# Patient Record
Sex: Female | Born: 1985
Health system: Southern US, Community
[De-identification: ages and names within clinical notes are randomized; demographics above are authoritative.]

## PROBLEM LIST (undated history)

## (undated) DIAGNOSIS — I10 Essential (primary) hypertension: Secondary | ICD-10-CM

## (undated) DIAGNOSIS — E039 Hypothyroidism, unspecified: Secondary | ICD-10-CM

## (undated) DIAGNOSIS — Z8619 Personal history of other infectious and parasitic diseases: Secondary | ICD-10-CM

## (undated) DIAGNOSIS — T8859XA Other complications of anesthesia, initial encounter: Secondary | ICD-10-CM

## (undated) DIAGNOSIS — B009 Herpesviral infection, unspecified: Secondary | ICD-10-CM

## (undated) DIAGNOSIS — K219 Gastro-esophageal reflux disease without esophagitis: Secondary | ICD-10-CM

## (undated) DIAGNOSIS — T4145XA Adverse effect of unspecified anesthetic, initial encounter: Secondary | ICD-10-CM

## (undated) HISTORY — DX: Personal history of other infectious and parasitic diseases: Z86.19

## (undated) HISTORY — PX: NO PAST SURGERIES: SHX2092

---

## 2012-01-15 ENCOUNTER — Encounter: Payer: Self-pay | Admitting: Internal Medicine

## 2012-01-15 ENCOUNTER — Ambulatory Visit (INDEPENDENT_AMBULATORY_CARE_PROVIDER_SITE_OTHER): Payer: PRIVATE HEALTH INSURANCE | Admitting: Internal Medicine

## 2012-01-15 DIAGNOSIS — F419 Anxiety disorder, unspecified: Secondary | ICD-10-CM

## 2012-01-15 DIAGNOSIS — F17200 Nicotine dependence, unspecified, uncomplicated: Secondary | ICD-10-CM

## 2012-01-15 DIAGNOSIS — G4700 Insomnia, unspecified: Secondary | ICD-10-CM

## 2012-01-15 DIAGNOSIS — Z720 Tobacco use: Secondary | ICD-10-CM

## 2012-01-15 DIAGNOSIS — F411 Generalized anxiety disorder: Secondary | ICD-10-CM

## 2012-01-15 MED ORDER — BUPROPION HCL ER (XL) 150 MG PO TB24: 150.0000 mg | ORAL_TABLET | Freq: Every day | ORAL | Status: DC

## 2012-01-15 MED ORDER — ALPRAZOLAM 0.25 MG PO TABS: 0.2500 mg | ORAL_TABLET | Freq: Two times a day (BID) | ORAL | Status: AC | PRN

## 2012-01-15 NOTE — Assessment & Plan Note (Signed)
Will plan to start Wellbutrin 150 mg daily. Will also use Xanax as needed for severe episodes of anxiety. Patient will followup in one month to assess progress. She will call sooner if any concerns or questions.

## 2012-01-15 NOTE — Assessment & Plan Note (Signed)
Secondary to anxiety. As above, we will start Wellbutrin to help with anxiety. She will also use Xanax as needed for both severe anxiety and to help initiate sleep as needed. She will followup in one month.

## 2012-01-15 NOTE — Progress Notes (Signed)
Subjective:    Patient ID: Jacqueline Peterson, female    DOB: Apr 08, 1986, 26 y.o.   MRN: [id number]  HPI 26 year old female presents to establish care. She reports that generally she is feeling good. Her primary concern today is increased anxiety since starting her new job. 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 She notes increased anxiety during her workday when dealing with patients. She then is very fatigued at night been unable to sleep because of anxiety. She denies any depressed mood. She is interested in medications which might help with anxiety.  She also notes a history of smoking. She used to smoke 2 packs per day but has cut this down to half a pack per day. She is interested in mechanisms to help her quit smoking. She has tried nicotine replacement before with no improvement. She notes she is trying to make positive changes in her health is currently training for a 5K race.  Outpatient Encounter Prescriptions as of 01/15/2012  Medication Sig Dispense Refill  . ALPRAZolam (XANAX) 0.25 MG tablet Take 1 tablet (0.25 mg total) by mouth 2 (two) times daily as needed for sleep.  20 tablet  0  . buPROPion (WELLBUTRIN XL) 150 MG 24 hr tablet Take 1 tablet (150 mg total) by mouth daily.  30 tablet  3  . Cyanocobalamin (B-12) 1500 MCG TBCR Take by mouth daily.      . Multiple Vitamins-Minerals (MULTIVITAMIN WITH MINERALS) tablet Take 1 tablet by mouth daily.        Review of Systems  Constitutional: Negative for fever, chills, appetite change, fatigue and unexpected weight change.  HENT: Negative for ear pain, congestion, sore throat, trouble swallowing, neck pain, voice change and sinus pressure.   Eyes: Negative for visual disturbance.  Respiratory: Negative for cough, shortness of breath, wheezing and stridor.   Cardiovascular: Negative for chest pain, palpitations and leg swelling.  Gastrointestinal: Negative for nausea, vomiting, abdominal pain,  diarrhea, constipation, blood in stool, abdominal distention and anal bleeding.  Genitourinary: Negative for dysuria and flank pain.  Musculoskeletal: Negative for myalgias, arthralgias and gait problem.  Skin: Negative for color change and rash.  Neurological: Negative for dizziness and headaches.  Hematological: Negative for adenopathy. Does not bruise/bleed easily.  Psychiatric/Behavioral: Negative for suicidal ideas, sleep disturbance and dysphoric mood. The patient is nervous/anxious.    BP 116/60  Pulse 98  Temp(Src) 98 F (36.7 C) (Oral)  Ht 5' 3.5" (1.613 m)  Wt 215 lb (97.523 kg)  BMI 37.49 kg/m2  SpO2 100%  LMP 01/11/2012     Objective:   Physical Exam  Constitutional: She is oriented to person, place, and time. She appears well-developed and well-nourished. No distress.  HENT:  Head: Normocephalic and atraumatic.  Right Ear: External ear normal.  Left Ear: External ear normal.  Nose: Nose normal.  Mouth/Throat: Oropharynx is clear and moist. No oropharyngeal exudate.  Eyes: Conjunctivae are normal. Pupils are equal, round, and reactive to light. Right eye exhibits no discharge. Left eye exhibits no discharge. No scleral icterus.  Neck: Normal range of motion. Neck supple. No tracheal deviation present. No thyromegaly present.  Cardiovascular: Normal rate, regular rhythm, normal heart sounds and intact distal pulses.  Exam reveals no gallop and no friction rub.   No murmur heard. Pulmonary/Chest: Effort normal and breath sounds normal. No respiratory distress. She has no wheezes. She has no rales. She exhibits no tenderness.  Abdominal: Soft. Bowel sounds are normal. She  exhibits no distension and no mass. There is no tenderness. There is no rebound and no guarding.  Musculoskeletal: Normal range of motion. She exhibits no edema and no tenderness.  Lymphadenopathy:    She has no cervical adenopathy.  Neurological: She is alert and oriented to person, place, and time. No  cranial nerve deficit. She exhibits normal muscle tone. Coordination normal.  Skin: Skin is warm and dry. No rash noted. She is not diaphoretic. No erythema. No pallor.  Psychiatric: She has a normal mood and affect. Her behavior is normal. Judgment and thought content normal.          Assessment & Plan:

## 2012-01-15 NOTE — Assessment & Plan Note (Signed)
Encouraged smoking cessation. She will start Wellbutrin and then set a quit date approximately 2 weeks after starting this medication. She will followup in one month.

## 2012-03-24 ENCOUNTER — Encounter: Payer: Self-pay | Admitting: Internal Medicine

## 2012-03-24 ENCOUNTER — Ambulatory Visit (INDEPENDENT_AMBULATORY_CARE_PROVIDER_SITE_OTHER): Payer: PRIVATE HEALTH INSURANCE | Admitting: Internal Medicine

## 2012-03-24 VITALS — BP 110/60 | HR 81 | Temp 98.6°F | Ht 63.5 in | Wt 215.0 lb

## 2012-03-24 DIAGNOSIS — F411 Generalized anxiety disorder: Secondary | ICD-10-CM

## 2012-03-24 DIAGNOSIS — F17200 Nicotine dependence, unspecified, uncomplicated: Secondary | ICD-10-CM

## 2012-03-24 DIAGNOSIS — Z0000 Encounter for general adult medical examination without abnormal findings: Secondary | ICD-10-CM

## 2012-03-24 DIAGNOSIS — Z124 Encounter for screening for malignant neoplasm of cervix: Secondary | ICD-10-CM

## 2012-03-24 DIAGNOSIS — F419 Anxiety disorder, unspecified: Secondary | ICD-10-CM

## 2012-03-24 DIAGNOSIS — E663 Overweight: Secondary | ICD-10-CM | POA: Insufficient documentation

## 2012-03-24 DIAGNOSIS — Z720 Tobacco use: Secondary | ICD-10-CM

## 2012-03-24 MED ORDER — ALPRAZOLAM 0.25 MG PO TABS: 0.2500 mg | ORAL_TABLET | Freq: Every evening | ORAL | Status: DC | PRN

## 2012-03-24 MED ORDER — PHENTERMINE HCL 37.5 MG PO CAPS: 37.5000 mg | ORAL_CAPSULE | ORAL | Status: DC

## 2012-03-24 NOTE — Assessment & Plan Note (Signed)
General exam including breast exam and pelvic exam is normal today. Pap is pending. Encourage continued efforts at healthy diet and exercise. Encourage smoking cessation. Reviewed recent lab work which showed normal cholesterol, kidney function and liver function. Patient will followup in one month.

## 2012-03-24 NOTE — Assessment & Plan Note (Signed)
Encourage smoking cessation. May try Wellbutrin again in the future when she feels ready.

## 2012-03-24 NOTE — Assessment & Plan Note (Signed)
BMI 37. Discussed importance of monitoring caloric intake and exercising. Will start phentermine to help with appetite suppression. Discussed potential side effects. Patient will followup in one month.

## 2012-03-24 NOTE — Assessment & Plan Note (Signed)
Symptoms were improved with Wellbutrin. However, patient has stopped medication and would like to hold off on use for now. May resume in the future if symptoms recur.

## 2012-03-24 NOTE — Progress Notes (Signed)
Subjective:    Patient ID: Jacqueline Peterson, female    DOB: 1986-09-22, 26 y.o.   MRN: [id number]  HPI 26 year old female with history of anxiety presents for annual exam. She reports that she is doing well. She has stopped taking her Wellbutrin, but notes that she did have some improvement in her anxiety level with this medication.  She is concerned today about her weight. She has been making effort to lose weight by following a healthy diet and increasing her physical activity. She is interested in trying appetite suppressant to help with weight loss. She has not used these in the past.  Outpatient Encounter Prescriptions as of 03/24/2012  Medication Sig Dispense Refill  . ALPRAZolam (XANAX) 0.25 MG tablet Take 1 tablet (0.25 mg total) by mouth at bedtime as needed.  30 tablet  5  . buPROPion (WELLBUTRIN XL) 150 MG 24 hr tablet Take 1 tablet (150 mg total) by mouth daily.  30 tablet  3  . Cyanocobalamin (B-12) 1500 MCG TBCR Take by mouth daily.      . Multiple Vitamins-Minerals (MULTIVITAMIN WITH MINERALS) tablet Take 1 tablet by mouth daily.      . vitamin C (ASCORBIC ACID) 500 MG tablet Take 500 mg by mouth daily.      Marland Kitchen DISCONTD: ALPRAZolam (XANAX) 0.25 MG tablet Take 0.25 mg by mouth at bedtime as needed.      . phentermine 37.5 MG capsule Take 1 capsule (37.5 mg total) by mouth every morning.  30 capsule  1   BP 110/60  Pulse 81  Temp(Src) 98.6 F (37 C) (Oral)  Ht 5' 3.5" (1.613 m)  Wt 215 lb (97.523 kg)  BMI 37.49 kg/m2  LMP 03/10/2012  Review of Systems  Constitutional: Negative for fever, chills, appetite change, fatigue and unexpected weight change.  HENT: Negative for ear pain, congestion, sore throat, trouble swallowing, neck pain, voice change and sinus pressure.   Eyes: Negative for visual disturbance.  Respiratory: Negative for cough, shortness of breath, wheezing and stridor.   Cardiovascular: Negative for chest pain, palpitations and leg swelling.  Gastrointestinal:  Negative for nausea, vomiting, abdominal pain, diarrhea, constipation, blood in stool, abdominal distention and anal bleeding.  Genitourinary: Negative for dysuria and flank pain.  Musculoskeletal: Negative for myalgias, arthralgias and gait problem.  Skin: Negative for color change and rash.  Neurological: Negative for dizziness and headaches.  Hematological: Negative for adenopathy. Does not bruise/bleed easily.  Psychiatric/Behavioral: Negative for suicidal ideas, sleep disturbance and dysphoric mood. The patient is not nervous/anxious.        Objective:   Physical Exam  Constitutional: She is oriented to person, place, and time. She appears well-developed and well-nourished. No distress.  HENT:  Head: Normocephalic and atraumatic.  Right Ear: External ear normal.  Left Ear: External ear normal.  Nose: Nose normal.  Mouth/Throat: Oropharynx is clear and moist. No oropharyngeal exudate.  Eyes: Conjunctivae are normal. Pupils are equal, round, and reactive to light. Right eye exhibits no discharge. Left eye exhibits no discharge. No scleral icterus.  Neck: Normal range of motion. Neck supple. No tracheal deviation present. No thyromegaly present.  Cardiovascular: Normal rate, regular rhythm, normal heart sounds and intact distal pulses.  Exam reveals no gallop and no friction rub.   No murmur heard. Pulmonary/Chest: Effort normal and breath sounds normal. No respiratory distress. She has no wheezes. She has no rales. She exhibits no tenderness.  Abdominal: Soft. Bowel sounds are normal. She exhibits no distension and no mass. There  is no tenderness. There is no rebound and no guarding.  Genitourinary: Rectum normal, vagina normal and uterus normal. No breast swelling, tenderness, discharge or bleeding. Pelvic exam was performed with patient supine. There is no rash, tenderness or lesion on the right labia. There is no rash, tenderness or lesion on the left labia. Uterus is not enlarged and  not tender. Cervix exhibits no motion tenderness, no discharge and no friability. Right adnexum displays no mass, no tenderness and no fullness. Left adnexum displays no mass, no tenderness and no fullness. No erythema or tenderness around the vagina. No vaginal discharge found.  Musculoskeletal: Normal range of motion. She exhibits no edema and no tenderness.  Lymphadenopathy:    She has no cervical adenopathy.  Neurological: She is alert and oriented to person, place, and time. No cranial nerve deficit. She exhibits normal muscle tone. Coordination normal.  Skin: Skin is warm and dry. No rash noted. She is not diaphoretic. No erythema. No pallor.  Psychiatric: She has a normal mood and affect. Her behavior is normal. Judgment and thought content normal.          Assessment & Plan:

## 2012-03-25 ENCOUNTER — Other Ambulatory Visit (HOSPITAL_COMMUNITY)
Admission: RE | Admit: 2012-03-25 | Discharge: 2012-03-25 | Disposition: A | Discharge status: Home / Self Care | Payer: PRIVATE HEALTH INSURANCE | Source: Ambulatory Visit | Attending: Internal Medicine | Admitting: Internal Medicine

## 2012-03-25 DIAGNOSIS — Z1159 Encounter for screening for other viral diseases: Secondary | ICD-10-CM | POA: Insufficient documentation

## 2012-03-25 DIAGNOSIS — Z01419 Encounter for gynecological examination (general) (routine) without abnormal findings: Secondary | ICD-10-CM | POA: Insufficient documentation

## 2012-03-25 NOTE — Progress Notes (Signed)
Addended by: Jobie Quaker on: 03/25/2012 10:33 AM   Modules accepted: Orders

## 2012-04-09 LAB — HM PAP SMEAR: HM Pap smear: NORMAL

## 2012-04-28 ENCOUNTER — Ambulatory Visit (INDEPENDENT_AMBULATORY_CARE_PROVIDER_SITE_OTHER): Payer: PRIVATE HEALTH INSURANCE | Admitting: Internal Medicine

## 2012-04-28 ENCOUNTER — Encounter: Payer: Self-pay | Admitting: Internal Medicine

## 2012-04-28 VITALS — BP 102/60 | HR 77 | Temp 98.1°F | Ht 63.5 in | Wt 200.5 lb

## 2012-04-28 DIAGNOSIS — E663 Overweight: Secondary | ICD-10-CM

## 2012-04-28 MED ORDER — PHENTERMINE HCL 37.5 MG PO CAPS: 37.5000 mg | ORAL_CAPSULE | ORAL | Status: DC

## 2012-04-28 NOTE — Progress Notes (Signed)
  Subjective:    Patient ID: Jacqueline Peterson, female    DOB: [birth year], 26 y.o.   MRN: [id number]  HPI 26 year old female with history of being overweight presents for followup. She reports that she has not been able to exercise for the last 2 weeks because of some pain in her ankles after running a 5K race. However, she has been compliant with healthy diet. She has been using phentermine to help with appetite suppression. She denies any side effects from this medication. She has lost 10 pounds.  Outpatient Encounter Prescriptions as of 04/28/2012  Medication Sig Dispense Refill  . ALPRAZolam (XANAX) 0.25 MG tablet Take 1 tablet (0.25 mg total) by mouth at bedtime as needed.  30 tablet  5  . buPROPion (WELLBUTRIN XL) 150 MG 24 hr tablet Take 1 tablet (150 mg total) by mouth daily.  30 tablet  3  . Cyanocobalamin (B-12) 1500 MCG TBCR Take by mouth daily.      . Multiple Vitamins-Minerals (MULTIVITAMIN WITH MINERALS) tablet Take 1 tablet by mouth daily.      . phentermine 37.5 MG capsule Take 1 capsule (37.5 mg total) by mouth every morning.  30 capsule  1  . vitamin C (ASCORBIC ACID) 500 MG tablet Take 500 mg by mouth daily.      . Vitamin E LIQD Apply 1 application topically daily.      Marland Kitchen DISCONTD: phentermine 37.5 MG capsule Take 1 capsule (37.5 mg total) by mouth every morning.  30 capsule  1    Review of Systems  Constitutional: Negative for fever, chills and fatigue.  Respiratory: Negative for shortness of breath.   Cardiovascular: Negative for chest pain and palpitations.   BP 102/60  Pulse 77  Temp 98.1 F (36.7 C) (Oral)  Ht 5' 3.5" (1.613 m)  Wt 200 lb 8 oz (90.946 kg)  BMI 34.96 kg/m2  SpO2 98%  LMP 04/08/2012     Objective:   Physical Exam  Constitutional: She is oriented to person, place, and time. She appears well-developed and well-nourished. No distress.  HENT:  Head: Normocephalic and atraumatic.  Right Ear: External ear normal.  Left Ear: External ear normal.    Nose: Nose normal.  Mouth/Throat: Oropharynx is clear and moist. No oropharyngeal exudate.  Eyes: Conjunctivae are normal. Pupils are equal, round, and reactive to light. Right eye exhibits no discharge. Left eye exhibits no discharge. No scleral icterus.  Neck: Normal range of motion. Neck supple. No tracheal deviation present. No thyromegaly present.  Cardiovascular: Normal rate, regular rhythm, normal heart sounds and intact distal pulses.  Exam reveals no gallop and no friction rub.   No murmur heard. Pulmonary/Chest: Effort normal and breath sounds normal. No respiratory distress. She has no wheezes. She has no rales. She exhibits no tenderness.  Musculoskeletal: Normal range of motion. She exhibits no edema and no tenderness.  Lymphadenopathy:    She has no cervical adenopathy.  Neurological: She is alert and oriented to person, place, and time. No cranial nerve deficit. She exhibits normal muscle tone. Coordination normal.  Skin: Skin is warm and dry. No rash noted. She is not diaphoretic. No erythema. No pallor.  Psychiatric: She has a normal mood and affect. Her behavior is normal. Judgment and thought content normal.          Assessment & Plan:

## 2012-04-28 NOTE — Assessment & Plan Note (Signed)
Congratulated patient nontender on weight loss. Encouraged her to continue efforts at healthy diet and physical activity. Will continue phentermine to help with appetite suppression. Followup one month.

## 2012-06-03 ENCOUNTER — Ambulatory Visit (INDEPENDENT_AMBULATORY_CARE_PROVIDER_SITE_OTHER): Payer: PRIVATE HEALTH INSURANCE | Admitting: Internal Medicine

## 2012-06-03 ENCOUNTER — Encounter: Payer: Self-pay | Admitting: Internal Medicine

## 2012-06-03 VITALS — BP 110/80 | HR 79 | Temp 98.3°F | Ht 63.5 in | Wt 191.5 lb

## 2012-06-03 DIAGNOSIS — F17200 Nicotine dependence, unspecified, uncomplicated: Secondary | ICD-10-CM

## 2012-06-03 DIAGNOSIS — F419 Anxiety disorder, unspecified: Secondary | ICD-10-CM

## 2012-06-03 DIAGNOSIS — F411 Generalized anxiety disorder: Secondary | ICD-10-CM

## 2012-06-03 DIAGNOSIS — Z720 Tobacco use: Secondary | ICD-10-CM

## 2012-06-03 DIAGNOSIS — E663 Overweight: Secondary | ICD-10-CM

## 2012-06-03 MED ORDER — PHENTERMINE HCL 37.5 MG PO CAPS: 37.5000 mg | ORAL_CAPSULE | ORAL | Status: DC

## 2012-06-03 MED ORDER — VARENICLINE TARTRATE 0.5 MG X 11 & 1 MG X 42 PO MISC: ORAL | Status: DC

## 2012-06-03 NOTE — Assessment & Plan Note (Signed)
No improvement with use of Wellbutrin. Will start Chantix. Discussed the potential risk of this medication including increased anxiety, nightmares. Patient will call if any questions or concerns.

## 2012-06-03 NOTE — Assessment & Plan Note (Signed)
Symptoms of anxiety well controlled with the use of Wellbutrin. Will plan to continue.

## 2012-06-03 NOTE — Progress Notes (Signed)
Subjective:    Patient ID: Jacqueline Peterson, female    DOB: 06-21-1986, 26 y.o.   MRN: [id number]  HPI 26 year old female with history of obesity, anxiety, and tobacco use presents for followup. In regards to her weight, she reports she is following a healthy diet and is exercising almost daily. She continues on phentermine to help with appetite suppression. She has lost another 9 pounds in the last month. She denies any side effects from phentermine.  In regards to anxiety, she reports that symptoms are well controlled with Wellbutrin. However, this medication has not helped her with smoking. She is interested in trying Chantix to help her quit. She reports that she typically is good at avoiding use of cigarettes at work but then smokes and she is at home. She notes that she lives with her mother who smokes.  Outpatient Encounter Prescriptions as of 06/03/2012  Medication Sig Dispense Refill  . ALPRAZolam (XANAX) 0.25 MG tablet Take 1 tablet (0.25 mg total) by mouth at bedtime as needed.  30 tablet  5  . buPROPion (WELLBUTRIN XL) 150 MG 24 hr tablet Take 1 tablet (150 mg total) by mouth daily.  30 tablet  3  . Cyanocobalamin (B-12) 1500 MCG TBCR Take by mouth daily.      . Multiple Vitamins-Minerals (MULTIVITAMIN WITH MINERALS) tablet Take 1 tablet by mouth daily.      . phentermine 37.5 MG capsule Take 1 capsule (37.5 mg total) by mouth every morning.  30 capsule  1  . vitamin C (ASCORBIC ACID) 500 MG tablet Take 500 mg by mouth daily.      . Vitamin E LIQD Apply 1 application topically daily.      Marland Kitchen DISCONTD: phentermine 37.5 MG capsule Take 1 capsule (37.5 mg total) by mouth every morning.  30 capsule  1  . varenicline (CHANTIX STARTING MONTH PAK) 0.5 MG X 11 & 1 MG X 42 tablet Take one 0.5 mg tablet by mouth once daily for 3 days, then increase to one 0.5 mg tablet twice daily for 4 days, then increase to one 1 mg tablet twice daily.  53 tablet  0   BP 110/80  Pulse 79  Temp 98.3 F (36.8  C) (Oral)  Ht 5' 3.5" (1.613 m)  Wt 191 lb 8 oz (86.864 kg)  BMI 33.39 kg/m2  SpO2 98%  LMP 05/09/2012  Review of Systems  Constitutional: Negative for fever, chills, appetite change, fatigue and unexpected weight change.  HENT: Negative for ear pain, congestion, sore throat, trouble swallowing, neck pain, voice change and sinus pressure.   Eyes: Negative for visual disturbance.  Respiratory: Negative for cough, shortness of breath, wheezing and stridor.   Cardiovascular: Negative for chest pain, palpitations and leg swelling.  Gastrointestinal: Negative for nausea, vomiting, abdominal pain, diarrhea, constipation, blood in stool, abdominal distention and anal bleeding.  Genitourinary: Negative for dysuria and flank pain.  Musculoskeletal: Negative for myalgias, arthralgias and gait problem.  Skin: Negative for color change and rash.  Neurological: Negative for dizziness and headaches.  Hematological: Negative for adenopathy. Does not bruise/bleed easily.  Psychiatric/Behavioral: Negative for suicidal ideas, disturbed wake/sleep cycle and dysphoric mood. The patient is nervous/anxious.        Objective:   Physical Exam  Constitutional: She is oriented to person, place, and time. She appears well-developed and well-nourished. No distress.  HENT:  Head: Normocephalic and atraumatic.  Right Ear: External ear normal.  Left Ear: External ear normal.  Nose: Nose normal.  Mouth/Throat:  Oropharynx is clear and moist. No oropharyngeal exudate.  Eyes: Conjunctivae are normal. Pupils are equal, round, and reactive to light. Right eye exhibits no discharge. Left eye exhibits no discharge. No scleral icterus.  Neck: Normal range of motion. Neck supple. No tracheal deviation present. No thyromegaly present.  Cardiovascular: Normal rate, regular rhythm, normal heart sounds and intact distal pulses.  Exam reveals no gallop and no friction rub.   No murmur heard. Pulmonary/Chest: Effort normal.  No accessory muscle usage. Not tachypneic. No respiratory distress. She has decreased breath sounds (prolonged expiration). She has no wheezes. She has no rales. She exhibits no tenderness.  Musculoskeletal: Normal range of motion. She exhibits no edema and no tenderness.  Lymphadenopathy:    She has no cervical adenopathy.  Neurological: She is alert and oriented to person, place, and time. No cranial nerve deficit. She exhibits normal muscle tone. Coordination normal.  Skin: Skin is warm and dry. No rash noted. She is not diaphoretic. No erythema. No pallor.  Psychiatric: She has a normal mood and affect. Her behavior is normal. Judgment and thought content normal.          Assessment & Plan:

## 2012-06-03 NOTE — Assessment & Plan Note (Signed)
Congratulated patient on another 9 pound weight loss. Encouraged her to continue efforts at healthy diet and regular physical activity. Will continue phentermine to help with appetite suppression.

## 2012-07-04 ENCOUNTER — Encounter: Payer: Self-pay | Admitting: Internal Medicine

## 2012-07-04 ENCOUNTER — Ambulatory Visit (INDEPENDENT_AMBULATORY_CARE_PROVIDER_SITE_OTHER): Payer: PRIVATE HEALTH INSURANCE | Admitting: Internal Medicine

## 2012-07-04 VITALS — BP 130/80 | HR 64 | Temp 98.7°F | Ht 63.5 in | Wt 192.0 lb

## 2012-07-04 DIAGNOSIS — F17200 Nicotine dependence, unspecified, uncomplicated: Secondary | ICD-10-CM

## 2012-07-04 DIAGNOSIS — F419 Anxiety disorder, unspecified: Secondary | ICD-10-CM

## 2012-07-04 DIAGNOSIS — Z720 Tobacco use: Secondary | ICD-10-CM

## 2012-07-04 DIAGNOSIS — F411 Generalized anxiety disorder: Secondary | ICD-10-CM

## 2012-07-04 DIAGNOSIS — E663 Overweight: Secondary | ICD-10-CM

## 2012-07-04 MED ORDER — BUPROPION HCL ER (XL) 150 MG PO TB24: 150.0000 mg | ORAL_TABLET | Freq: Every day | ORAL | Status: DC

## 2012-07-04 MED ORDER — PHENTERMINE HCL 37.5 MG PO CAPS: 37.5000 mg | ORAL_CAPSULE | ORAL | Status: DC

## 2012-07-04 NOTE — Assessment & Plan Note (Signed)
BMI 33. Weight has been stable over the last month. Patient only intermittently using phentermine. Congratulated her on keeping weight stable especially given that she has quit smoking this month. We'll plan to followup in one month.

## 2012-07-04 NOTE — Assessment & Plan Note (Signed)
Symptoms well controlled on current medications. Will continue. 

## 2012-07-04 NOTE — Progress Notes (Signed)
Subjective:    Patient ID: Jacqueline Peterson, female    DOB: 03-26-1986, 26 y.o.   MRN: [id number]  HPI 26 year old female with history of obesity and anxiety presents for followup. She reports that over the last month she has quit smoking. She decided to quit cold [place]. She had tried Chantix but found that this medication made her more depressed. In regards to her anxiety, she reports good control of her symptoms with use of Wellbutrin and Xanax as needed. In regards to her weight, she notes she continues to take phentermine but does not remember it every day. Her weight is been stable over the last month. Aside from this, she reports she is doing well.  Outpatient Encounter Prescriptions as of 07/04/2012  Medication Sig Dispense Refill  . ALPRAZolam (XANAX) 0.25 MG tablet Take 1 tablet (0.25 mg total) by mouth at bedtime as needed.  30 tablet  5  . buPROPion (WELLBUTRIN XL) 150 MG 24 hr tablet Take 1 tablet (150 mg total) by mouth daily.  30 tablet  6  . Cyanocobalamin (B-12) 1500 MCG TBCR Take by mouth daily.      . Multiple Vitamins-Minerals (MULTIVITAMIN WITH MINERALS) tablet Take 1 tablet by mouth daily.      . phentermine 37.5 MG capsule Take 1 capsule (37.5 mg total) by mouth every morning.  30 capsule  1  . vitamin C (ASCORBIC ACID) 500 MG tablet Take 500 mg by mouth daily.      . Vitamin E LIQD Apply 1 application topically daily.       BP 130/80  Pulse 64  Temp 98.7 F (37.1 C) (Oral)  Ht 5' 3.5" (1.613 m)  Wt 192 lb (87.091 kg)  BMI 33.48 kg/m2  SpO2 97%  LMP 06/08/2012  Review of Systems  Constitutional: Negative for fever, chills, appetite change, fatigue and unexpected weight change.  HENT: Negative for ear pain, congestion, sore throat, trouble swallowing, neck pain, voice change and sinus pressure.   Eyes: Negative for visual disturbance.  Respiratory: Negative for cough, shortness of breath, wheezing and stridor.   Cardiovascular: Negative for chest pain, palpitations  and leg swelling.  Gastrointestinal: Negative for nausea, vomiting, abdominal pain, diarrhea, constipation, blood in stool, abdominal distention and anal bleeding.  Genitourinary: Negative for dysuria and flank pain.  Musculoskeletal: Negative for myalgias, arthralgias and gait problem.  Skin: Negative for color change and rash.  Neurological: Negative for dizziness and headaches.  Hematological: Negative for adenopathy. Does not bruise/bleed easily.  Psychiatric/Behavioral: Negative for suicidal ideas, disturbed wake/sleep cycle and dysphoric mood. The patient is not nervous/anxious.        Objective:   Physical Exam  Constitutional: She is oriented to person, place, and time. She appears well-developed and well-nourished. No distress.  HENT:  Head: Normocephalic and atraumatic.  Right Ear: External ear normal.  Left Ear: External ear normal.  Nose: Nose normal.  Mouth/Throat: Oropharynx is clear and moist. No oropharyngeal exudate.  Eyes: Conjunctivae are normal. Pupils are equal, round, and reactive to light. Right eye exhibits no discharge. Left eye exhibits no discharge. No scleral icterus.  Neck: Normal range of motion. Neck supple. No tracheal deviation present. No thyromegaly present.  Cardiovascular: Normal rate, regular rhythm, normal heart sounds and intact distal pulses.  Exam reveals no gallop and no friction rub.   No murmur heard. Pulmonary/Chest: Effort normal and breath sounds normal. No respiratory distress. She has no wheezes. She has no rales. She exhibits no tenderness.  Musculoskeletal: Normal range  of motion. She exhibits no edema and no tenderness.  Lymphadenopathy:    She has no cervical adenopathy.  Neurological: She is alert and oriented to person, place, and time. No cranial nerve deficit. She exhibits normal muscle tone. Coordination normal.  Skin: Skin is warm and dry. No rash noted. She is not diaphoretic. No erythema. No pallor.  Psychiatric: She has a  normal mood and affect. Her behavior is normal. Judgment and thought content normal.          Assessment & Plan:

## 2012-07-04 NOTE — Assessment & Plan Note (Signed)
Congratulated patient on quitting smoking. We'll continue to monitor.

## 2012-08-04 ENCOUNTER — Ambulatory Visit (INDEPENDENT_AMBULATORY_CARE_PROVIDER_SITE_OTHER): Payer: PRIVATE HEALTH INSURANCE | Admitting: Internal Medicine

## 2012-08-04 ENCOUNTER — Encounter: Payer: Self-pay | Admitting: Internal Medicine

## 2012-08-04 VITALS — BP 130/80 | HR 72 | Temp 98.5°F | Ht 63.5 in | Wt 183.8 lb

## 2012-08-04 DIAGNOSIS — E663 Overweight: Secondary | ICD-10-CM

## 2012-08-04 MED ORDER — PHENTERMINE HCL 37.5 MG PO CAPS: 37.5000 mg | ORAL_CAPSULE | ORAL | Status: DC

## 2012-08-04 NOTE — Progress Notes (Signed)
Subjective:    Patient ID: Jacqueline Peterson, female    DOB: 06-16-1986, 25 y.o.   MRN: [id number]  HPI 26 year old female with history of obesity and anxiety presents for followup. Over the last month, she has lost another 10 pounds. She reports that she has been physically active, but just bathing in softball and other sports. She has also been limiting her total caloric intake. She continues to use phentermine to help with appetite suppression. She denies any noted side effects from this medication. She reports she is generally feeling well.  Outpatient Encounter Prescriptions as of 08/04/2012  Medication Sig Dispense Refill  . ALPRAZolam (XANAX) 0.25 MG tablet Take 1 tablet (0.25 mg total) by mouth at bedtime as needed.  30 tablet  5  . buPROPion (WELLBUTRIN XL) 150 MG 24 hr tablet Take 1 tablet (150 mg total) by mouth daily.  30 tablet  6  . Cyanocobalamin (B-12) 1500 MCG TBCR Take by mouth daily.      . Multiple Vitamins-Minerals (MULTIVITAMIN WITH MINERALS) tablet Take 1 tablet by mouth daily.      . phentermine 37.5 MG capsule Take 1 capsule (37.5 mg total) by mouth every morning.  30 capsule  1  . vitamin C (ASCORBIC ACID) 500 MG tablet Take 500 mg by mouth daily.      . Vitamin E LIQD Apply 1 application topically daily.      Marland Kitchen DISCONTD: phentermine 37.5 MG capsule Take 1 capsule (37.5 mg total) by mouth every morning.  30 capsule  1   BP 130/80  Pulse 72  Temp 98.5 F (36.9 C) (Oral)  Ht 5' 3.5" (1.613 m)  Wt 183 lb 12 oz (83.348 kg)  BMI 32.04 kg/m2  SpO2 99%  LMP 07/09/2012  Review of Systems  Constitutional: Negative for fever, chills, appetite change, fatigue and unexpected weight change.  HENT: Negative for ear pain, congestion, sore throat, trouble swallowing, neck pain, voice change and sinus pressure.   Eyes: Negative for visual disturbance.  Respiratory: Negative for cough, shortness of breath, wheezing and stridor.   Cardiovascular: Negative for chest pain,  palpitations and leg swelling.  Gastrointestinal: Negative for nausea, vomiting, abdominal pain, diarrhea, constipation, blood in stool, abdominal distention and anal bleeding.  Genitourinary: Negative for dysuria and flank pain.  Musculoskeletal: Negative for myalgias, arthralgias and gait problem.  Skin: Negative for color change and rash.  Neurological: Negative for dizziness and headaches.  Hematological: Negative for adenopathy. Does not bruise/bleed easily.  Psychiatric/Behavioral: Negative for suicidal ideas, disturbed wake/sleep cycle and dysphoric mood. The patient is not nervous/anxious.        Objective:   Physical Exam  Constitutional: She is oriented to person, place, and time. She appears well-developed and well-nourished. No distress.  HENT:  Head: Normocephalic and atraumatic.  Right Ear: External ear normal.  Left Ear: External ear normal.  Nose: Nose normal.  Mouth/Throat: Oropharynx is clear and moist. No oropharyngeal exudate.  Eyes: Conjunctivae normal are normal. Pupils are equal, round, and reactive to light. Right eye exhibits no discharge. Left eye exhibits no discharge. No scleral icterus.  Neck: Normal range of motion. Neck supple. No tracheal deviation present. No thyromegaly present.  Cardiovascular: Normal rate, regular rhythm, normal heart sounds and intact distal pulses.  Exam reveals no gallop and no friction rub.   No murmur heard. Pulmonary/Chest: Effort normal and breath sounds normal. No respiratory distress. She has no wheezes. She has no rales. She exhibits no tenderness.  Musculoskeletal: Normal range of  motion. She exhibits no edema and no tenderness.  Lymphadenopathy:    She has no cervical adenopathy.  Neurological: She is alert and oriented to person, place, and time. No cranial nerve deficit. She exhibits normal muscle tone. Coordination normal.  Skin: Skin is warm and dry. No rash noted. She is not diaphoretic. No erythema. No pallor.    Psychiatric: She has a normal mood and affect. Her behavior is normal. Judgment and thought content normal.          Assessment & Plan:

## 2012-08-04 NOTE — Assessment & Plan Note (Signed)
Congratulated patient on another 10 pound weight loss. Encouraged her to continue efforts at healthy diet and regular physical activity. Will continue phentermine for appetite suppression. Followup one month.

## 2012-10-06 ENCOUNTER — Ambulatory Visit: Payer: PRIVATE HEALTH INSURANCE | Admitting: Internal Medicine

## 2012-11-11 ENCOUNTER — Ambulatory Visit: Payer: PRIVATE HEALTH INSURANCE | Admitting: Internal Medicine

## 2012-11-11 ENCOUNTER — Ambulatory Visit (INDEPENDENT_AMBULATORY_CARE_PROVIDER_SITE_OTHER): Payer: PRIVATE HEALTH INSURANCE | Admitting: Adult Health

## 2012-11-11 ENCOUNTER — Encounter: Payer: Self-pay | Admitting: Adult Health

## 2012-11-11 VITALS — BP 115/81 | HR 53 | Temp 98.2°F | Ht 63.0 in | Wt 182.0 lb

## 2012-11-11 DIAGNOSIS — R300 Dysuria: Secondary | ICD-10-CM

## 2012-11-11 DIAGNOSIS — N898 Other specified noninflammatory disorders of vagina: Secondary | ICD-10-CM

## 2012-11-11 DIAGNOSIS — N390 Urinary tract infection, site not specified: Secondary | ICD-10-CM

## 2012-11-11 DIAGNOSIS — N760 Acute vaginitis: Secondary | ICD-10-CM

## 2012-11-11 DIAGNOSIS — N762 Acute vulvitis: Secondary | ICD-10-CM

## 2012-11-11 DIAGNOSIS — J111 Influenza due to unidentified influenza virus with other respiratory manifestations: Secondary | ICD-10-CM

## 2012-11-11 DIAGNOSIS — R6889 Other general symptoms and signs: Secondary | ICD-10-CM

## 2012-11-11 DIAGNOSIS — J069 Acute upper respiratory infection, unspecified: Secondary | ICD-10-CM | POA: Insufficient documentation

## 2012-11-11 DIAGNOSIS — Z113 Encounter for screening for infections with a predominantly sexual mode of transmission: Secondary | ICD-10-CM

## 2012-11-11 LAB — POCT URINALYSIS DIPSTICK
Bilirubin, UA: NEGATIVE
Glucose, UA: NEGATIVE
Nitrite, UA: NEGATIVE
Spec Grav, UA: 1.025
Urobilinogen, UA: 0.2
pH, UA: 6

## 2012-11-11 LAB — POCT INFLUENZA A/B: Influenza B, POC: NEGATIVE

## 2012-11-11 MED ORDER — METRONIDAZOLE 0.75 % VA GEL: VAGINAL | Status: DC

## 2012-11-11 NOTE — Assessment & Plan Note (Addendum)
POCT Influenza A/B negative. Appears viral. Supportive care. Drink fluids to stay hydrated. OTC medications for specific symptoms of cough, congestion.

## 2012-11-11 NOTE — Patient Instructions (Addendum)
  I will call you with your results once they are ready.  Use 1 applicator at bedtime for 5 days.  Call if your symptoms worsen or do not improve.

## 2012-11-11 NOTE — Assessment & Plan Note (Addendum)
Sexually active female who reports inconsistency with STD protection. Discussed with pt and vaginal swab sent for BV, trich, herpes, clamydia, gonorrhea. Treat empirically with metrogel for BV.

## 2012-11-11 NOTE — Progress Notes (Signed)
  Subjective:    Patient ID: Jacqueline Peterson, female    DOB: [birth year], 26 y.o.   MRN: [id number]  HPI  Patient is a 26 y/o female who presents to clinic with c/o dysuria and possible flu (non productive cough, rhinorrhea). She denies fever, chills. She also c/o irritation of vulva. She reports that these symptoms began ~ 2 days ago. She thinks it might be attributed to changing soaps; however, she states she changed to a hypoallergenic soap (Dove).  Patient is sexually active. She does not use protection consistently and she is uncertain if her partner is monogamous.    Current Outpatient Prescriptions on File Prior to Visit  Medication Sig Dispense Refill  . ALPRAZolam (XANAX) 0.25 MG tablet Take 1 tablet (0.25 mg total) by mouth at bedtime as needed.  30 tablet  5  . buPROPion (WELLBUTRIN XL) 150 MG 24 hr tablet Take 1 tablet (150 mg total) by mouth daily.  30 tablet  6  . Cyanocobalamin (B-12) 1500 MCG TBCR Take by mouth daily.      . Multiple Vitamins-Minerals (MULTIVITAMIN WITH MINERALS) tablet Take 1 tablet by mouth daily.      . phentermine 37.5 MG capsule Take 1 capsule (37.5 mg total) by mouth every morning.  30 capsule  1  . vitamin C (ASCORBIC ACID) 500 MG tablet Take 500 mg by mouth daily.      . Vitamin E LIQD Apply 1 application topically daily.          Review of Systems  Constitutional: Negative for fever, chills and fatigue.  HENT: Positive for congestion, rhinorrhea and postnasal drip.   Eyes: Negative.   Respiratory: Positive for cough. Negative for shortness of breath and wheezing.   Cardiovascular: Negative.   Gastrointestinal: Negative.   Genitourinary: Positive for dysuria and vaginal discharge.  Skin: Negative for rash.  Neurological: Negative.   Psychiatric/Behavioral: Negative.      BP 115/81  Pulse 53  Temp 98.2 F (36.8 C) (Oral)  Ht 5\' 3"  (1.6 m)  Wt 182 lb (82.555 kg)  BMI 32.24 kg/m2  SpO2 97%  LMP 11/08/2012     Objective:   Physical Exam   Constitutional: She is oriented to person, place, and time. She appears well-developed and well-nourished. No distress.  HENT:  Head: Normocephalic.       Post nasal drip  Eyes: Right eye exhibits no discharge. Left eye exhibits no discharge.  Neck: Normal range of motion. No tracheal deviation present.  Cardiovascular: Normal rate, regular rhythm and normal heart sounds.   Pulmonary/Chest: No respiratory distress. She exhibits no tenderness.  Genitourinary: Vaginal discharge found.       White-grey discharge. Irritation/erythema of vulva.  Musculoskeletal: Normal range of motion.  Neurological: She is alert and oriented to person, place, and time. Coordination normal.  Skin: Skin is warm and dry. No rash noted.  Psychiatric: She has a normal mood and affect. Her behavior is normal. Judgment and thought content normal.          Assessment & Plan:

## 2012-11-11 NOTE — Assessment & Plan Note (Signed)
UA, culture sent. Initial UA dipstick negative for UTI. Await culture.

## 2012-11-13 LAB — URINE CULTURE
Colony Count: NO GROWTH
Organism ID, Bacteria: NO GROWTH

## 2012-11-14 ENCOUNTER — Other Ambulatory Visit: Payer: Self-pay | Admitting: Adult Health

## 2012-12-23 DIAGNOSIS — Z113 Encounter for screening for infections with a predominantly sexual mode of transmission: Secondary | ICD-10-CM | POA: Insufficient documentation

## 2012-12-23 NOTE — Assessment & Plan Note (Signed)
Sexually active female who reports inconsistency with STD protection.  Discussed with pt about screening for STD given uncertainty if partner is monogamous.  Check for BV, trich, herpes, clamydia, gonorrhea

## 2012-12-31 ENCOUNTER — Other Ambulatory Visit: Payer: Self-pay

## 2013-07-10 ENCOUNTER — Encounter: Payer: Self-pay | Admitting: Internal Medicine

## 2013-07-10 ENCOUNTER — Ambulatory Visit (INDEPENDENT_AMBULATORY_CARE_PROVIDER_SITE_OTHER): Payer: 59 | Admitting: Internal Medicine

## 2013-07-10 VITALS — BP 122/76 | HR 76 | Temp 98.3°F | Resp 12 | Ht 63.5 in | Wt 199.5 lb

## 2013-07-10 DIAGNOSIS — F419 Anxiety disorder, unspecified: Secondary | ICD-10-CM

## 2013-07-10 DIAGNOSIS — Z0000 Encounter for general adult medical examination without abnormal findings: Secondary | ICD-10-CM

## 2013-07-10 DIAGNOSIS — E663 Overweight: Secondary | ICD-10-CM

## 2013-07-10 DIAGNOSIS — F411 Generalized anxiety disorder: Secondary | ICD-10-CM

## 2013-07-10 DIAGNOSIS — F17200 Nicotine dependence, unspecified, uncomplicated: Secondary | ICD-10-CM

## 2013-07-10 DIAGNOSIS — Z720 Tobacco use: Secondary | ICD-10-CM

## 2013-07-10 MED ORDER — PHENTERMINE HCL 37.5 MG PO CAPS: 37.5000 mg | ORAL_CAPSULE | ORAL | Status: DC

## 2013-07-10 MED ORDER — ALPRAZOLAM 0.25 MG PO TABS: 0.2500 mg | ORAL_TABLET | Freq: Every evening | ORAL | Status: DC | PRN

## 2013-07-10 MED ORDER — BUPROPION HCL ER (XL) 150 MG PO TB24: 150.0000 mg | ORAL_TABLET | Freq: Every day | ORAL | Status: DC

## 2013-07-10 NOTE — Assessment & Plan Note (Signed)
Strongly encourage smoking cessation. Patient is not ready to quit.

## 2013-07-10 NOTE — Progress Notes (Signed)
Subjective:    Patient ID: Jacqueline Peterson, female    DOB: 1986/10/31, 27 y.o.   MRN: [id number]  HPI 27 year old female with history of anxiety/depression, obesity presents for annual exam. She reports she is generally doing well. She notes that it has been very busy at work. She reports some ongoing stressors both at work and at home but feels that she is coping well with use of Wellbutrin and alprazolam. No side effects noted from these medications.  She is not following any particular diet right now. She is not exercising on a regular basis. She would like to get back on track and start back on phentermine to help with appetite suppression.  She is interested in starting on some form of contraceptive. However, she continues to smoke. She has been told in the past that she cannot use oral contraceptives and smoking. She wonders if she could use oral contraceptives and she started aspirin at the same time.  Outpatient Encounter Prescriptions as of 07/10/2013  Medication Sig Dispense Refill  . ALPRAZolam (XANAX) 0.25 MG tablet Take 1 tablet (0.25 mg total) by mouth at bedtime as needed.  30 tablet  5  . buPROPion (WELLBUTRIN XL) 150 MG 24 hr tablet Take 1 tablet (150 mg total) by mouth daily.  30 tablet  6  . phentermine 37.5 MG capsule Take 1 capsule (37.5 mg total) by mouth every morning.  30 capsule  1   No facility-administered encounter medications on file as of 07/10/2013.   BP 122/76  Pulse 76  Temp(Src) 98.3 F (36.8 C) (Oral)  Resp 12  Ht 5' 3.5" (1.613 m)  Wt 199 lb 8 oz (90.493 kg)  BMI 34.78 kg/m2  SpO2 98%  LMP 06/27/2013  Review of Systems  Constitutional: Negative for fever, chills, appetite change, fatigue and unexpected weight change.  HENT: Negative for ear pain, congestion, sore throat, trouble swallowing, neck pain, voice change and sinus pressure.   Eyes: Negative for visual disturbance.  Respiratory: Negative for cough, shortness of breath, wheezing and stridor.    Cardiovascular: Negative for chest pain, palpitations and leg swelling.  Gastrointestinal: Negative for nausea, vomiting, abdominal pain, diarrhea, constipation, blood in stool, abdominal distention and anal bleeding.  Genitourinary: Negative for dysuria and flank pain.  Musculoskeletal: Negative for myalgias, arthralgias and gait problem.  Skin: Negative for color change and rash.  Neurological: Negative for dizziness and headaches.  Hematological: Negative for adenopathy. Does not bruise/bleed easily.  Psychiatric/Behavioral: Negative for suicidal ideas, sleep disturbance and dysphoric mood. The patient is not nervous/anxious.        Objective:   Physical Exam  Constitutional: She is oriented to person, place, and time. She appears well-developed and well-nourished. No distress.  HENT:  Head: Normocephalic and atraumatic.  Right Ear: External ear normal.  Left Ear: External ear normal.  Nose: Nose normal.  Mouth/Throat: Oropharynx is clear and moist. No oropharyngeal exudate.  Eyes: Conjunctivae are normal. Pupils are equal, round, and reactive to light. Right eye exhibits no discharge. Left eye exhibits no discharge. No scleral icterus.  Neck: Normal range of motion. Neck supple. No tracheal deviation present. No thyromegaly present.  Cardiovascular: Normal rate, regular rhythm, normal heart sounds and intact distal pulses.  Exam reveals no gallop and no friction rub.   No murmur heard. Pulmonary/Chest: Effort normal and breath sounds normal. No accessory muscle usage. Not tachypneic. No respiratory distress. She has no decreased breath sounds. She has no wheezes. She has no rales. She exhibits no  tenderness. Right breast exhibits no inverted nipple, no mass, no nipple discharge, no skin change and no tenderness. Left breast exhibits no inverted nipple, no mass, no nipple discharge, no skin change and no tenderness. Breasts are symmetrical.  Abdominal: Soft. Bowel sounds are normal. She  exhibits no distension and no mass. There is no tenderness. There is no rebound and no guarding.  Musculoskeletal: Normal range of motion. She exhibits no edema and no tenderness.  Lymphadenopathy:    She has no cervical adenopathy.  Neurological: She is alert and oriented to person, place, and time. No cranial nerve deficit. She exhibits normal muscle tone. Coordination normal.  Skin: Skin is warm and dry. No rash noted. She is not diaphoretic. No erythema. No pallor.  Psychiatric: She has a normal mood and affect. Her behavior is normal. Judgment and thought content normal.          Assessment & Plan:

## 2013-07-10 NOTE — Assessment & Plan Note (Signed)
Symptoms well controlled with alprazolam and Wellbutrin. Will continue.

## 2013-07-10 NOTE — Assessment & Plan Note (Signed)
General medical exam including breast exam normal today. Pap and pelvic deferred as these were both normal in 2013. Encouraged smoking cessation. Encouraged healthy diet and regular physical activity with goal of weight loss. Discussed risk of using oral contraceptives in the setting of ongoing tobacco use. Recommended referral for possible IUD. Patient declines for now. Will check labs including CBC, CMP, lipid profile, TSH. Patient declines Pneumovax.

## 2013-07-10 NOTE — Assessment & Plan Note (Signed)
Wt Readings from Last 3 Encounters:  07/10/13 199 lb 8 oz (90.493 kg)  11/11/12 182 lb (82.555 kg)  08/04/12 183 lb 12 oz (83.348 kg)   Encouraged healthy diet and regular physical activity with goal of 40 minutes 3 times per week. Will restart phentermine to help with appetite suppression. Followup for blood pressure and weight check in one month.

## 2013-07-11 LAB — CBC WITH DIFFERENTIAL/PLATELET
Basophils Absolute: 0 10*3/uL (ref 0.0–0.1)
Eosinophils Absolute: 0.2 10*3/uL (ref 0.0–0.7)
HCT: 41.2 % (ref 36.0–46.0)
Hemoglobin: 13.9 g/dL (ref 12.0–15.0)
Lymphs Abs: 2.2 10*3/uL (ref 0.7–4.0)
MCHC: 33.7 g/dL (ref 30.0–36.0)
Monocytes Absolute: 0.5 10*3/uL (ref 0.1–1.0)
Neutro Abs: 5.9 10*3/uL (ref 1.4–7.7)
Platelets: 229 10*3/uL (ref 150.0–400.0)
RDW: 12.7 % (ref 11.5–14.6)

## 2013-07-11 LAB — COMPREHENSIVE METABOLIC PANEL
ALT: 17 U/L (ref 0–35)
AST: 18 U/L (ref 0–37)
CO2: 27 mEq/L (ref 19–32)
Chloride: 106 mEq/L (ref 96–112)
Creatinine, Ser: 0.7 mg/dL (ref 0.4–1.2)
Sodium: 137 mEq/L (ref 135–145)
Total Bilirubin: 0.5 mg/dL (ref 0.3–1.2)
Total Protein: 6.6 g/dL (ref 6.0–8.3)

## 2013-07-11 LAB — TSH: TSH: 3.54 u[IU]/mL (ref 0.35–5.50)

## 2013-07-11 LAB — LIPID PANEL: Total CHOL/HDL Ratio: 4

## 2013-08-14 ENCOUNTER — Encounter: Payer: Self-pay | Admitting: *Deleted

## 2013-08-14 ENCOUNTER — Encounter: Payer: Self-pay | Admitting: Internal Medicine

## 2013-08-14 ENCOUNTER — Ambulatory Visit (INDEPENDENT_AMBULATORY_CARE_PROVIDER_SITE_OTHER): Payer: 59 | Admitting: Internal Medicine

## 2013-08-14 VITALS — BP 132/90 | HR 66 | Temp 98.4°F | Wt 191.0 lb

## 2013-08-14 DIAGNOSIS — B009 Herpesviral infection, unspecified: Secondary | ICD-10-CM | POA: Insufficient documentation

## 2013-08-14 DIAGNOSIS — E663 Overweight: Secondary | ICD-10-CM

## 2013-08-14 MED ORDER — VALACYCLOVIR HCL 1 G PO TABS: 1000.0000 mg | ORAL_TABLET | Freq: Two times a day (BID) | ORAL | Status: DC

## 2013-08-14 NOTE — Assessment & Plan Note (Signed)
Skin lesion left middle finger most consistent with herpes. Will start Valtrex to try to shorten course of symptoms. Encouraged pt to apply topical polysporin to prevent secondary infection. She will call if lesion is not improving.

## 2013-08-14 NOTE — Assessment & Plan Note (Signed)
Wt Readings from Last 3 Encounters:  08/14/13 191 lb (86.637 kg)  07/10/13 199 lb 8 oz (90.493 kg)  11/11/12 182 lb (82.555 kg)   Congratulated pt on weight loss. Encouraged continued efforts at healthy diet and regular physical activity. Continue phentermine for appetite suppression. Follow up 1 month.

## 2013-08-14 NOTE — Progress Notes (Signed)
Subjective:    Patient ID: Jacqueline Peterson, female    DOB: September 20, 1986, 27 y.o.   MRN: [id number]  HPI 26YO female with h/o obesity presents to follow up. She has been following lower calorie diet and trying to stay active. She has been using phentermine to help with appetite suppression. No side effects noted from this medication, including no palpitations, headache.  She is concerned about new rash on left middle finger. Initially appeared as tiny blisters on palmar surface. This area resolved, then she developed blisters on over her knuckle. The blisters are very painful. She denies any new exposures. She does not garden. No fever, chills. She has been applying topical polysporin with no improvement.  Outpatient Prescriptions Prior to Visit  Medication Sig Dispense Refill  . ALPRAZolam (XANAX) 0.25 MG tablet Take 1 tablet (0.25 mg total) by mouth at bedtime as needed.  30 tablet  5  . buPROPion (WELLBUTRIN XL) 150 MG 24 hr tablet Take 1 tablet (150 mg total) by mouth daily.  30 tablet  6  . phentermine 37.5 MG capsule Take 1 capsule (37.5 mg total) by mouth every morning.  30 capsule  1   No facility-administered medications prior to visit.   BP 132/90  Pulse 66  Temp(Src) 98.4 F (36.9 C) (Oral)  Wt 191 lb (86.637 kg)  BMI 33.3 kg/m2  SpO2 99%  LMP 07/28/2013  Review of Systems  Constitutional: Negative for fever, chills, appetite change, fatigue and unexpected weight change.  Eyes: Negative for visual disturbance.  Respiratory: Negative for cough, shortness of breath, wheezing and stridor.   Cardiovascular: Negative for chest pain, palpitations and leg swelling.  Gastrointestinal: Negative for nausea, abdominal pain, diarrhea, constipation and abdominal distention.  Genitourinary: Negative for dysuria and flank pain.  Musculoskeletal: Negative for myalgias, arthralgias and gait problem.  Skin: Positive for color change and wound. Negative for rash.  Neurological: Negative for  dizziness and headaches.  Hematological: Negative for adenopathy. Does not bruise/bleed easily.  Psychiatric/Behavioral: Negative for dysphoric mood. The patient is not nervous/anxious.        Objective:   Physical Exam  Constitutional: She is oriented to person, place, and time. She appears well-developed and well-nourished. No distress.  HENT:  Head: Normocephalic and atraumatic.  Right Ear: External ear normal.  Left Ear: External ear normal.  Nose: Nose normal.  Mouth/Throat: Oropharynx is clear and moist. No oropharyngeal exudate.  Eyes: Conjunctivae are normal. Pupils are equal, round, and reactive to light. Right eye exhibits no discharge. Left eye exhibits no discharge. No scleral icterus.  Neck: Normal range of motion. Neck supple. No tracheal deviation present. No thyromegaly present.  Cardiovascular: Normal rate, regular rhythm, normal heart sounds and intact distal pulses.  Exam reveals no gallop and no friction rub.   No murmur heard. Pulmonary/Chest: Effort normal and breath sounds normal. No accessory muscle usage. Not tachypneic. No respiratory distress. She has no decreased breath sounds. She has no wheezes. She has no rhonchi. She has no rales. She exhibits no tenderness.  Musculoskeletal: Normal range of motion. She exhibits no edema and no tenderness.  Lymphadenopathy:    She has no cervical adenopathy.  Neurological: She is alert and oriented to person, place, and time. No cranial nerve deficit. She exhibits normal muscle tone. Coordination normal.  Skin: Skin is warm and dry. Rash noted. Rash is vesicular (erythematous cluster of vessicles left distal middle finger). She is not diaphoretic. There is erythema. No pallor.  Psychiatric: She has a normal  mood and affect. Her behavior is normal. Judgment and thought content normal.          Assessment & Plan:

## 2013-09-04 ENCOUNTER — Encounter: Payer: Self-pay | Admitting: *Deleted

## 2013-09-05 ENCOUNTER — Ambulatory Visit: Payer: 59 | Admitting: Internal Medicine

## 2013-09-21 ENCOUNTER — Other Ambulatory Visit: Payer: Self-pay

## 2013-09-25 ENCOUNTER — Encounter: Payer: Self-pay | Admitting: *Deleted

## 2013-09-26 ENCOUNTER — Encounter: Payer: Self-pay | Admitting: Internal Medicine

## 2013-09-26 ENCOUNTER — Ambulatory Visit (INDEPENDENT_AMBULATORY_CARE_PROVIDER_SITE_OTHER): Payer: 59 | Admitting: Internal Medicine

## 2013-09-26 VITALS — BP 138/84 | HR 70 | Temp 98.3°F | Resp 12 | Wt 195.8 lb

## 2013-09-26 DIAGNOSIS — F419 Anxiety disorder, unspecified: Secondary | ICD-10-CM

## 2013-09-26 DIAGNOSIS — E663 Overweight: Secondary | ICD-10-CM

## 2013-09-26 DIAGNOSIS — F411 Generalized anxiety disorder: Secondary | ICD-10-CM

## 2013-09-26 MED ORDER — BUPROPION HCL ER (XL) 300 MG PO TB24: 300.0000 mg | ORAL_TABLET | Freq: Every day | ORAL | Status: DC

## 2013-09-27 NOTE — Progress Notes (Signed)
  Subjective:    Patient ID: Jacqueline Peterson, female    DOB: [birth year], 27 y.o.   MRN: [id number]  HPI 27 year old female with history of obesity presents for followup. She reports it has been a difficult time for her. She notes some dietary indiscretion. She has gained about 4 pounds since her last visit. She is planning to get back on track and resumed using phentermine to help with appetite suppression.  Outpatient Encounter Prescriptions as of 09/26/2013  Medication Sig  . ALPRAZolam (XANAX) 0.25 MG tablet Take 1 tablet (0.25 mg total) by mouth at bedtime as needed.  Marland Kitchen buPROPion (WELLBUTRIN XL) 300 MG 24 hr tablet Take 1 tablet (300 mg total) by mouth daily.  . phentermine 37.5 MG capsule Take 1 capsule (37.5 mg total) by mouth every morning.  . valACYclovir (VALTREX) 1000 MG tablet Take 1 tablet (1,000 mg total) by mouth 2 (two) times daily.  . [DISCONTINUED] buPROPion (WELLBUTRIN XL) 150 MG 24 hr tablet Take 1 tablet (150 mg total) by mouth daily.   BP 138/84  Pulse 70  Temp(Src) 98.3 F (36.8 C) (Oral)  Resp 12  Wt 195 lb 12 oz (88.792 kg)  SpO2 93%  LMP 09/19/2013  Review of Systems  Constitutional: Negative for fever, chills and fatigue.  Respiratory: Negative for shortness of breath.   Cardiovascular: Negative for chest pain and palpitations.  Gastrointestinal: Negative for abdominal pain.       Objective:   Physical Exam  Constitutional: She is oriented to person, place, and time. She appears well-developed and well-nourished. No distress.  HENT:  Head: Normocephalic and atraumatic.  Right Ear: External ear normal.  Left Ear: External ear normal.  Nose: Nose normal.  Mouth/Throat: Oropharynx is clear and moist. No oropharyngeal exudate.  Eyes: Conjunctivae are normal. Pupils are equal, round, and reactive to light. Right eye exhibits no discharge. Left eye exhibits no discharge. No scleral icterus.  Neck: Normal range of motion. Neck supple. No tracheal deviation  present. No thyromegaly present.  Cardiovascular: Normal rate, regular rhythm, normal heart sounds and intact distal pulses.  Exam reveals no gallop and no friction rub.   No murmur heard. Pulmonary/Chest: Effort normal and breath sounds normal. No accessory muscle usage. Not tachypneic. No respiratory distress. She has no decreased breath sounds. She has no wheezes. She has no rhonchi. She has no rales. She exhibits no tenderness.  Musculoskeletal: Normal range of motion. She exhibits no edema and no tenderness.  Lymphadenopathy:    She has no cervical adenopathy.  Neurological: She is alert and oriented to person, place, and time. No cranial nerve deficit. She exhibits normal muscle tone. Coordination normal.  Skin: Skin is warm and dry. No rash noted. She is not diaphoretic. No erythema. No pallor.  Psychiatric: She has a normal mood and affect. Her behavior is normal. Judgment and thought content normal.          Assessment & Plan:

## 2013-09-27 NOTE — Assessment & Plan Note (Signed)
Wt Readings from Last 3 Encounters:  09/26/13 195 lb 12 oz (88.792 kg)  08/14/13 191 lb (86.637 kg)  07/10/13 199 lb 8 oz (90.493 kg)   Pt notes some recent dietary indiscretion. Encouraged better compliance with diet. Will continue phentermine for appetite suppression. Follow up 8 weeks and prn.

## 2013-11-21 ENCOUNTER — Ambulatory Visit: Payer: 59 | Admitting: Internal Medicine

## 2014-05-03 ENCOUNTER — Encounter: Payer: Self-pay | Admitting: Adult Health

## 2014-05-03 ENCOUNTER — Ambulatory Visit (INDEPENDENT_AMBULATORY_CARE_PROVIDER_SITE_OTHER): Payer: 59 | Admitting: Adult Health

## 2014-05-03 VITALS — BP 122/72 | HR 92 | Temp 98.3°F | Resp 14 | Ht 63.5 in | Wt 211.5 lb

## 2014-05-03 DIAGNOSIS — Z113 Encounter for screening for infections with a predominantly sexual mode of transmission: Secondary | ICD-10-CM

## 2014-05-03 DIAGNOSIS — R238 Other skin changes: Secondary | ICD-10-CM

## 2014-05-03 DIAGNOSIS — R21 Rash and other nonspecific skin eruption: Secondary | ICD-10-CM

## 2014-05-03 NOTE — Progress Notes (Signed)
Pre visit review using our clinic review tool, if applicable. No additional management support is needed unless otherwise documented below in the visit note. 

## 2014-05-03 NOTE — Patient Instructions (Signed)
  Continue valtrex  You can apply calamine lotion to the area which will help dry up the lesions as well as provide some relief of symptoms  I will contact you with results once they area available.

## 2014-05-03 NOTE — Progress Notes (Signed)
Patient ID: Jacqueline Peterson, female   DOB: Dec 01, 1985, 28 y.o.   MRN: [id number]   Subjective:    Patient ID: Jacqueline Peterson, female    DOB: 11-Aug-1986, 28 y.o.   MRN: [id number]  HPI  Pt presents with rash on right buttocks cheeks that she first noticed this past Sunday. She reports that it is painful, itchy and pustular. Has been taking valtrex for herpetic lesion on her middle finger of the left hand.   Past Medical History  Diagnosis Date  . History of chicken pox     Current Outpatient Prescriptions on File Prior to Visit  Medication Sig Dispense Refill  . ALPRAZolam (XANAX) 0.25 MG tablet Take 1 tablet (0.25 mg total) by mouth at bedtime as needed.  30 tablet  5  . buPROPion (WELLBUTRIN XL) 300 MG 24 hr tablet Take 1 tablet (300 mg total) by mouth daily.  30 tablet  6  . valACYclovir (VALTREX) 1000 MG tablet Take 1 tablet (1,000 mg total) by mouth 2 (two) times daily.  28 tablet  3   No current facility-administered medications on file prior to visit.     Review of Systems  Constitutional: Negative for fever, chills and fatigue.  Skin: Positive for rash (right buttock, medial).  All other systems reviewed and are negative.      Objective:  BP 122/72  Pulse 92  Temp(Src) 98.3 F (36.8 C) (Oral)  Resp 14  Ht 5' 3.5" (1.613 m)  Wt 211 lb 8 oz (95.936 kg)  BMI 36.87 kg/m2  SpO2 96%   Physical Exam  Constitutional: She is oriented to person, place, and time. No distress.  Cardiovascular: Normal rate and regular rhythm.   Pulmonary/Chest: Effort normal. No respiratory distress.  Musculoskeletal: Normal range of motion.  Neurological: She is alert and oriented to person, place, and time.  Skin: Skin is warm and dry. Rash (vesicular rash, blistering) noted. There is erythema.  Psychiatric: She has a normal mood and affect. Her behavior is normal. Judgment and thought content normal.      Assessment & Plan:   1. Vesicular rash Suspect herpes. Discussed findings with  patient she is agreeable for other STD testing. Recently in new relationship for 6 months.  2. Screen for STD (sexually transmitted disease) Screening for STDs given herpetic rash, new onset after establishing new relationship - GC/chlamydia probe amp, urine - RPR - HIV antibody - HSV(herpes simplex vrs) 1+2 ab-IgG

## 2014-05-04 ENCOUNTER — Encounter: Payer: Self-pay | Admitting: Adult Health

## 2014-05-04 ENCOUNTER — Telehealth: Payer: Self-pay | Admitting: Internal Medicine

## 2014-05-04 LAB — RPR

## 2014-05-04 LAB — HSV(HERPES SIMPLEX VRS) I + II AB-IGG
HSV 1 Glycoprotein G Ab, IgG: <0.1 IV
HSV 2 GLYCOPROTEIN G AB, IGG: 9.66 IV — AB

## 2014-05-04 LAB — HIV ANTIBODY (ROUTINE TESTING W REFLEX): HIV 1&2 Ab, 4th Generation: NONREACTIVE

## 2014-05-04 NOTE — Telephone Encounter (Signed)
Relevant patient education mailed to patient.  

## 2014-05-05 LAB — GC/CHLAMYDIA PROBE AMP, URINE
Chlamydia, Swab/Urine, PCR: NEGATIVE
GC Probe Amp, Urine: NEGATIVE

## 2014-05-21 ENCOUNTER — Ambulatory Visit (INDEPENDENT_AMBULATORY_CARE_PROVIDER_SITE_OTHER): Payer: 59 | Admitting: Internal Medicine

## 2014-05-21 ENCOUNTER — Encounter: Payer: Self-pay | Admitting: Internal Medicine

## 2014-05-21 VITALS — BP 120/74 | HR 70 | Temp 98.3°F | Ht 63.5 in | Wt 214.2 lb

## 2014-05-21 DIAGNOSIS — B009 Herpesviral infection, unspecified: Secondary | ICD-10-CM

## 2014-05-21 DIAGNOSIS — F411 Generalized anxiety disorder: Secondary | ICD-10-CM

## 2014-05-21 DIAGNOSIS — F419 Anxiety disorder, unspecified: Secondary | ICD-10-CM

## 2014-05-21 MED ORDER — ALPRAZOLAM 0.25 MG PO TABS: 0.2500 mg | ORAL_TABLET | Freq: Every evening | ORAL | Status: DC | PRN

## 2014-05-21 NOTE — Assessment & Plan Note (Signed)
We discussed that rash may be secondary to Herpes Zoster or Herpes Simplex. If any recurrence, will plan to send PCR from sample. More likely herpes zoster given unilateral and non-painful, in pt with h/o herpes zoster vaccine.

## 2014-05-21 NOTE — Patient Instructions (Signed)
Try stopping Valtrex.  Call or email if any recurrent rash.

## 2014-05-21 NOTE — Progress Notes (Signed)
   Subjective:    Patient ID: Jacqueline Peterson, female    DOB: Jul 11, 1986, 28 y.o.   MRN: [id number]  HPI 28YO female presents to follow up rash.  Rash has resolved. Symptoms improved quickly on Valtrex. HSV2 was positive, but HSV1 neg. Has no prior h/o genital herpes.  Otherwise, feeling well. No concerns today.  Review of Systems  Constitutional: Negative for fever, chills, appetite change, fatigue and unexpected weight change.  Eyes: Negative for visual disturbance.  Respiratory: Negative for shortness of breath.   Cardiovascular: Negative for chest pain and leg swelling.  Gastrointestinal: Negative for abdominal pain.  Skin: Negative for color change and rash.  Hematological: Negative for adenopathy. Does not bruise/bleed easily.  Psychiatric/Behavioral: Negative for dysphoric mood. The patient is not nervous/anxious.        Objective:    BP 120/74  Pulse 70  Temp(Src) 98.3 F (36.8 C) (Oral)  Ht 5' 3.5" (1.613 m)  Wt 214 lb 4 oz (97.183 kg)  BMI 37.35 kg/m2  SpO2 96%  LMP 05/09/2014 Physical Exam  Constitutional: She is oriented to person, place, and time. She appears well-developed and well-nourished. No distress.  HENT:  Head: Normocephalic and atraumatic.  Right Ear: External ear normal.  Left Ear: External ear normal.  Nose: Nose normal.  Mouth/Throat: Oropharynx is clear and moist. No oropharyngeal exudate.  Eyes: Conjunctivae are normal. Pupils are equal, round, and reactive to light. Right eye exhibits no discharge. Left eye exhibits no discharge. No scleral icterus.  Neck: Normal range of motion. Neck supple. No tracheal deviation present. No thyromegaly present.  Cardiovascular: Normal rate, regular rhythm, normal heart sounds and intact distal pulses.  Exam reveals no gallop and no friction rub.   No murmur heard. Pulmonary/Chest: Effort normal and breath sounds normal. No accessory muscle usage. Not tachypneic. No respiratory distress. She has no decreased  breath sounds. She has no wheezes. She has no rhonchi. She has no rales. She exhibits no tenderness.  Musculoskeletal: Normal range of motion. She exhibits no edema and no tenderness.  Lymphadenopathy:    She has no cervical adenopathy.  Neurological: She is alert and oriented to person, place, and time. No cranial nerve deficit. She exhibits normal muscle tone. Coordination normal.  Skin: Skin is warm and dry. No rash noted. She is not diaphoretic. No erythema. No pallor.  Psychiatric: She has a normal mood and affect. Her behavior is normal. Judgment and thought content normal.          Assessment & Plan:   Problem List Items Addressed This Visit     Unprioritized   Anxiety   Relevant Medications      ALPRAZolam  (XANAX) tablet   Herpes - Primary     We discussed that rash may be secondary to Herpes Zoster or Herpes Simplex. If any recurrence, will plan to send PCR from sample. More likely herpes zoster given unilateral and non-painful, in pt with h/o herpes zoster vaccine.         Return in about 3 months (around 08/21/2014) for Physical.

## 2014-05-21 NOTE — Progress Notes (Signed)
Pre visit review using our clinic review tool, if applicable. No additional management support is needed unless otherwise documented below in the visit note. 

## 2014-06-21 ENCOUNTER — Ambulatory Visit (INDEPENDENT_AMBULATORY_CARE_PROVIDER_SITE_OTHER): Payer: 59

## 2014-06-21 ENCOUNTER — Encounter: Payer: Self-pay | Admitting: Podiatrist

## 2014-06-21 ENCOUNTER — Ambulatory Visit (INDEPENDENT_AMBULATORY_CARE_PROVIDER_SITE_OTHER): Payer: 59 | Admitting: Podiatrist

## 2014-06-21 VITALS — BP 119/72 | HR 67 | Resp 16 | Ht 63.0 in | Wt 210.0 lb

## 2014-06-21 DIAGNOSIS — L923 Foreign body granuloma of the skin and subcutaneous tissue: Secondary | ICD-10-CM

## 2014-06-21 DIAGNOSIS — Q828 Other specified congenital malformations of skin: Secondary | ICD-10-CM

## 2014-06-21 DIAGNOSIS — M216X1 Other acquired deformities of right foot: Secondary | ICD-10-CM

## 2014-06-21 DIAGNOSIS — M216X9 Other acquired deformities of unspecified foot: Secondary | ICD-10-CM

## 2014-06-21 NOTE — Progress Notes (Signed)
   Subjective:    Patient ID: Jacqueline Peterson, female    DOB: 1986-08-06, 28 y.o.   MRN: [id number]  HPI Comments: i have a place on my rt foot. It does hurt in that one spot. Ive had it for 3 years. Its getting worse. It hurts to walk and stand. i get pedicures, otc pads, and shave it down.  Foot Pain      Review of Systems  Musculoskeletal:       Difficulty walking  All other systems reviewed and are negative.      Objective:   Physical Exam Patient is awake, alert, and oriented x 3.  In no acute distress.  Vascular status is intact with palpable pedal pulses at 2/4 DP and PT bilateral and capillary refill time within normal limits. Neurological sensation is also intact bilaterally via Semmes Weinstein monofilament at 5/5 sites. Light touch, vibratory sensation, Achilles tendon reflex is intact. Dermatological exam reveals skin color, turger and texture as normal. No open lesions present.  Musculature intact with dorsiflexion, plantarflexion, inversion, eversion.  Prominent and plantarflexed second and third metatarsals are present bilateral. A porokeratotic lesion is present sub-met 3 of the right foot with a ground glass appearance and a central core present. Intact integument is present post debridement.     Assessment & Plan:  Plantarflexed metatarsal with porokeratotic lesion  Plan: A debridement of the lesion was carried out today without complication. She was given instructions for use of salicylic acid. She will be seen back as needed for followup. In the future she may benefit from orthotics to offload the prominent metatarsal heads.

## 2014-06-21 NOTE — Patient Instructions (Signed)
Salicylic Acid topical gel, cream, lotion, solution What is this medicine? SALICYCLIC ACID (SAL i SIL ik AS id) breaks down layers of thick skin. It is used to treat common and plantar warts, psoriasis, calluses, and corns. It is also used to treat or to prevent acne. This medicine may be used for other purposes; ask your health care provider or pharmacist if you have questions. COMMON BRAND NAME(S): [place]bon Jabs, Clear Away Liquid, Compound W, Corn/Callus Remover, Dermarest Psoriasis Moisturizer, Dermarest Psoriasis Overnight Treatment, Dermarest Psoriasis Scalp Treatment, Dermarest Psoriasis Skin Treatment, DuoFilm Wart Remover, G[place]ofilm, Hydrisalic, Keralyt, Neutrogena Acne Wash, [place], RE SA, SalAC, [employer], [place], [place], [place], [place] 2 in 1 Anti-Dandruff, [place], [place], Wart-Off What should I tell my health care provider before I take this medicine? They need to know if you have any of these conditions: -child with chickenpox, the flu, or other viral infection -kidney disease -liver disease -an unusual or allergic reaction to salicylic acid, other medicines, foods, dyes, or preservatives -pregnant or trying to get pregnant -breast-feeding How should I use this medicine? This medicine is for external use only. Follow the directions on the label. Do not apply to raw or irritated skin. Avoid getting medicine in your eyes, lips, nose, mouth, or other sensitive areas. Use this medicine at regular intervals. Do not use more often than directed. Talk to your pediatrician regarding the use of this medicine in children. Special care may be needed. This medicine is not approved for use in children under 42 years old. Overdosage: If you think you have taken too much of this medicine contact a poison control center or emergency room at once. NOTE: This medicine is only for you. Do not share this medicine with others. What if I miss a dose? If you miss a dose, use it as soon as you  can. If it is almost time for your next dose, use only that dose. Do not use double or extra doses. What may interact with this medicine? -medicines that change urine pH like ammonium chloride, sodium bicarbonate, and others -medicines that treat or prevent blood clots like warfarin -methotrexate -pyrazinamide -some medicines for diabetes -some medicines for gout -steroid medicines like prednisone or cortisone This list may not describe all possible interactions. Give your health care provider a list of all the medicines, herbs, non-prescription drugs, or dietary supplements you use. Also tell them if you smoke, drink alcohol, or use illegal drugs. Some items may interact with your medicine. What should I watch for while using this medicine? Tell your doctor is your symptoms do not get better or if they get worse. This medicine can make you more sensitive to the sun. Keep out of the sun. If you cannot avoid being in the sun, wear protective clothing and use sunscreen. Do not use sun lamps or tanning beds/booths. Use of this medicine in children under 12 years or in patients with kidney or liver disease may increase the risk of serious side effects. These patients should not use this medicine over large areas of skin. If you notice symptoms such as nausea, vomiting, dizziness, loss of hearing, ringing in the ears, unusual weakness or tiredness, fast or labored breathing, diarrhea, or confusion, stop using this medicine and contact your doctor or health care professional. What side effects may I notice from receiving this medicine? Side effects that you should report to your doctor or health care professional as soon as possible: -allergic reactions like skin rash, itching or hives, swelling of the face,  lips, or tongue Side effects that usually do not require medical attention (report to your doctor or health care professional if they continue or are bothersome): -skin irritation This list may not  describe all possible side effects. Call your doctor for medical advice about side effects. You may report side effects to FDA at 1-800-FDA-1088. Where should I keep my medicine? Keep out of the reach of children. Store at room temperature between 15 and 30 degrees C (59 and 86 degrees F). Do not freeze. Throw away any unused medicine after the expiration date. NOTE: This sheet is a summary. It may not cover all possible information. If you have questions about this medicine, talk to your doctor, pharmacist, or health care provider.  2015, Elsevier/Gold Standard. (2008-07-06 13:36:20)

## 2014-06-27 ENCOUNTER — Encounter: Payer: Self-pay | Admitting: Internal Medicine

## 2014-06-28 ENCOUNTER — Other Ambulatory Visit: Payer: Self-pay | Admitting: *Deleted

## 2014-06-28 ENCOUNTER — Telehealth: Payer: Self-pay | Admitting: *Deleted

## 2014-06-28 MED ORDER — VARENICLINE TARTRATE 0.5 MG X 11 & 1 MG X 42 PO MISC: ORAL | Status: DC

## 2014-06-28 NOTE — Telephone Encounter (Signed)
Rx printed for signature. 

## 2014-06-28 NOTE — Telephone Encounter (Signed)
What dose of chantix, 0.5mg  or 1mg ?

## 2014-06-28 NOTE — Telephone Encounter (Signed)
Rx already sent.

## 2014-06-28 NOTE — Telephone Encounter (Signed)
Rx faxed to pharmacy  

## 2014-06-28 NOTE — Telephone Encounter (Signed)
We should order the starter pack, which has escalating dose

## 2014-08-28 ENCOUNTER — Encounter: Payer: Self-pay | Admitting: Internal Medicine

## 2014-08-28 ENCOUNTER — Ambulatory Visit (INDEPENDENT_AMBULATORY_CARE_PROVIDER_SITE_OTHER): Payer: 59 | Admitting: Internal Medicine

## 2014-08-28 VITALS — BP 110/70 | HR 73 | Temp 98.3°F | Ht 63.0 in | Wt 225.5 lb

## 2014-08-28 DIAGNOSIS — B009 Herpesviral infection, unspecified: Secondary | ICD-10-CM

## 2014-08-28 DIAGNOSIS — R42 Dizziness and giddiness: Secondary | ICD-10-CM

## 2014-08-28 DIAGNOSIS — R002 Palpitations: Secondary | ICD-10-CM

## 2014-08-28 LAB — POCT URINE PREGNANCY: Preg Test, Ur: NEGATIVE

## 2014-08-28 MED ORDER — VALACYCLOVIR HCL 1 G PO TABS
1000.0000 mg | ORAL_TABLET | Freq: Two times a day (BID) | ORAL | Status: DC
Start: 2014-08-28 — End: 2015-06-28

## 2014-08-28 NOTE — Progress Notes (Signed)
Subjective:    Patient ID: Jacqueline Peterson, female    DOB: [birth year], 28 y.o.   MRN: [id number]  HPI 28YO female presents for follow up.  Palpitations - having fluttering heart beats over last 1.92months. Occurs intermittently, resolves after a few minutes without intervention. No chest pain, but has breast pain laterally on occasion. No dyspnea. Most often occurs at night. No new meds or supplements. No anxiety noted.   Dizziness - Started about 3 weeks ago. Also having some dizziness during the day, described as vertigo. Lasts about a minute then stops. No URI symptoms. Not taking anything for this.   Review of Systems  Constitutional: Negative for fever, chills, appetite change, fatigue and unexpected weight change.  Eyes: Negative for visual disturbance.  Respiratory: Negative for cough and shortness of breath.   Cardiovascular: Positive for palpitations. Negative for chest pain and leg swelling.  Gastrointestinal: Negative for nausea, vomiting, abdominal pain, diarrhea and constipation.  Skin: Negative for color change and rash.  Neurological: Positive for dizziness. Negative for tremors, seizures, syncope, facial asymmetry, speech difficulty, weakness, numbness and headaches.  Hematological: Negative for adenopathy. Does not bruise/bleed easily.  Psychiatric/Behavioral: Negative for dysphoric mood. The patient is not nervous/anxious.        Objective:    BP 110/70  Pulse 73  Temp(Src) 98.3 F (36.8 C) (Oral)  Ht 5\' 3"  (1.6 m)  Wt 225 lb 8 oz (102.286 kg)  BMI 39.96 kg/m2  SpO2 98%  LMP 08/06/2014 Physical Exam  Constitutional: She is oriented to person, place, and time. She appears well-developed and well-nourished. No distress.  HENT:  Head: Normocephalic and atraumatic.  Right Ear: External ear normal.  Left Ear: External ear normal.  Nose: Nose normal.  Mouth/Throat: Oropharynx is clear and moist. No oropharyngeal exudate.  Eyes: Conjunctivae are normal. Pupils  are equal, round, and reactive to light. Right eye exhibits no discharge. Left eye exhibits no discharge. No scleral icterus.  Neck: Normal range of motion. Neck supple. No tracheal deviation present. No thyromegaly present.  Cardiovascular: Normal rate, regular rhythm, normal heart sounds and intact distal pulses.  Exam reveals no gallop and no friction rub.   No murmur heard. Pulmonary/Chest: Effort normal and breath sounds normal. No accessory muscle usage. Not tachypneic. No respiratory distress. She has no decreased breath sounds. She has no wheezes. She has no rhonchi. She has no rales. She exhibits no tenderness.  Musculoskeletal: Normal range of motion. She exhibits no edema and no tenderness.  Lymphadenopathy:    She has no cervical adenopathy.  Neurological: She is alert and oriented to person, place, and time. No cranial nerve deficit. She exhibits normal muscle tone. Coordination normal.  Skin: Skin is warm and dry. No rash noted. She is not diaphoretic. No erythema. No pallor.  Psychiatric: She has a normal mood and affect. Her behavior is normal. Judgment and thought content normal.          Assessment & Plan:   Problem List Items Addressed This Visit     Unprioritized   Dizziness     Recent intermittent dizziness. Exam is normal. Will check CMP, TSH, CBC with labs.    Herpes   Relevant Medications      valACYclovir (VALTREX) tablet   Palpitations - Primary     Symptoms of intermittent palpitations for last 1.61months. Exam normal. EKG shows RBBB, however no prior for comparison. Will check electrolytes, thyroid function, CBC. Urine pregnancy test was negative. Recommended referral for Holter monitor,  but she prefers to hold off for now.    Relevant Orders      TSH      Comprehensive metabolic panel      CBC w/Diff      T4, free      POCT urine pregnancy (Completed)      EKG 12-Lead (Completed)       Return in about 4 weeks (around 09/25/2014).

## 2014-08-28 NOTE — Progress Notes (Signed)
Pre visit review using our clinic review tool, if applicable. No additional management support is needed unless otherwise documented below in the visit note. 

## 2014-08-28 NOTE — Assessment & Plan Note (Signed)
Recent intermittent dizziness. Exam is normal. Will check CMP, TSH, CBC with labs.

## 2014-08-28 NOTE — Patient Instructions (Signed)
Labs today.  Follow up in 4 weeks or sooner as needed. 

## 2014-08-28 NOTE — Assessment & Plan Note (Signed)
Symptoms of intermittent palpitations for last 1.76months. Exam normal. EKG shows RBBB, however no prior for comparison. Will check electrolytes, thyroid function, CBC. Urine pregnancy test was negative. Recommended referral for Holter monitor, but she prefers to hold off for now.

## 2014-08-29 LAB — T4, FREE: Free T4: 0.65 ng/dL (ref 0.60–1.60)

## 2014-08-29 LAB — COMPREHENSIVE METABOLIC PANEL
ALBUMIN: 3.4 g/dL — AB (ref 3.5–5.2)
ALT: 22 U/L (ref 0–35)
AST: 28 U/L (ref 0–37)
Alkaline Phosphatase: 58 U/L (ref 39–117)
BUN: 10 mg/dL (ref 6–23)
CALCIUM: 9.1 mg/dL (ref 8.4–10.5)
CHLORIDE: 103 meq/L (ref 96–112)
CO2: 27 meq/L (ref 19–32)
Creatinine, Ser: 0.9 mg/dL (ref 0.4–1.2)
GFR: 82.45 mL/min (ref >60.00)
Glucose, Bld: 83 mg/dL (ref 70–99)
POTASSIUM: 4 meq/L (ref 3.5–5.1)
Sodium: 137 mEq/L (ref 135–145)
Total Bilirubin: 0.4 mg/dL (ref 0.2–1.2)
Total Protein: 7.1 g/dL (ref 6.0–8.3)

## 2014-08-29 LAB — CBC WITH DIFFERENTIAL/PLATELET
BASOS PCT: 0.5 % (ref 0.0–3.0)
Basophils Absolute: 0 10*3/uL (ref 0.0–0.1)
Eosinophils Absolute: 0.2 10*3/uL (ref 0.0–0.7)
Eosinophils Relative: 1.8 % (ref 0.0–5.0)
HCT: 41.1 % (ref 36.0–46.0)
Hemoglobin: 13.5 g/dL (ref 12.0–15.0)
LYMPHS PCT: 25 % (ref 12.0–46.0)
Lymphs Abs: 2.4 10*3/uL (ref 0.7–4.0)
MCHC: 32.9 g/dL (ref 30.0–36.0)
MCV: 92.6 fl (ref 78.0–100.0)
MONOS PCT: 5.6 % (ref 3.0–12.0)
Monocytes Absolute: 0.5 10*3/uL (ref 0.1–1.0)
NEUTROS ABS: 6.4 10*3/uL (ref 1.4–7.7)
Neutrophils Relative %: 67.1 % (ref 43.0–77.0)
Platelets: 223 10*3/uL (ref 150.0–400.0)
RBC: 4.44 Mil/uL (ref 3.87–5.11)
RDW: 13.3 % (ref 11.5–15.5)
WBC: 9.6 10*3/uL (ref 4.0–10.5)

## 2014-08-29 LAB — TSH: TSH: 3.28 u[IU]/mL (ref 0.35–4.50)

## 2014-09-03 ENCOUNTER — Encounter: Payer: Self-pay | Admitting: Internal Medicine

## 2014-09-03 DIAGNOSIS — R002 Palpitations: Secondary | ICD-10-CM

## 2014-09-04 NOTE — Telephone Encounter (Signed)
Referral placed for cardiology

## 2014-09-20 ENCOUNTER — Ambulatory Visit (INDEPENDENT_AMBULATORY_CARE_PROVIDER_SITE_OTHER): Payer: 59 | Admitting: Cardiovascular Disease

## 2014-09-20 ENCOUNTER — Encounter: Payer: Self-pay | Admitting: Cardiovascular Disease

## 2014-09-20 VITALS — BP 124/78 | HR 86 | Ht 62.75 in | Wt 226.5 lb

## 2014-09-20 DIAGNOSIS — R0789 Other chest pain: Secondary | ICD-10-CM

## 2014-09-20 DIAGNOSIS — R002 Palpitations: Secondary | ICD-10-CM

## 2014-09-20 DIAGNOSIS — E669 Obesity, unspecified: Secondary | ICD-10-CM

## 2014-09-20 DIAGNOSIS — R079 Chest pain, unspecified: Secondary | ICD-10-CM

## 2014-09-20 DIAGNOSIS — R42 Dizziness and giddiness: Secondary | ICD-10-CM

## 2014-09-20 DIAGNOSIS — B009 Herpesviral infection, unspecified: Secondary | ICD-10-CM

## 2014-09-20 MED ORDER — PROPRANOLOL HCL 10 MG PO TABS: 10.0000 mg | ORAL_TABLET | Freq: Three times a day (TID) | ORAL | Status: DC | PRN

## 2014-09-20 NOTE — Patient Instructions (Signed)
Your next appointment will be scheduled in our new office located at :  [facility]  [address] [place], [place] [address]   You likely have ectopy, PACs PVCs, or atrial tachycardia Start bystolic 5 mg daily Take propranolol as needed 10 mg for breakthrough palpitations, ok to take a second if needed  Please call us if you have new issues that need to be addressed before your next appt.

## 2014-09-20 NOTE — Assessment & Plan Note (Signed)
We have encouraged continued exercise, careful diet management in an effort to lose weight. 

## 2014-09-20 NOTE — Assessment & Plan Note (Signed)
More than 5 minutes discussed on smoking cessation We have encouraged her to continue to work on weaning her cigarettes and smoking cessation. She will continue to work on this and does not want any assistance with chantix.

## 2014-09-20 NOTE — Assessment & Plan Note (Signed)
Left-sided chest pain, atypical in nature. Likely musculoskeletal

## 2014-09-20 NOTE — Assessment & Plan Note (Signed)
Recent episodes of dizziness, possibly associated with arrhythmia. Recommend she stay hydrated, well start the low-dose bystolic for rhythm control. If symptoms persist, recommended she call our office for Holter monitor. She has declined on today's visit. Order has been placed if she chooses to wear this

## 2014-09-20 NOTE — Assessment & Plan Note (Signed)
Two-month history of palpitations, tachycardia, occasional dizziness associated with these symptoms. Unable to exclude APCs, PVCs, even atrial tachycardia. Holter monitor was ordered, even a 30 day monitor. She would like to try medical management first before wearing a Holter. We have suggested she start bystolic 5 mg daily with propranolol 10 mg when necessary for breakthrough palpitations. Other options include metoprolol tartrate/succinate or long-acting propranolol. Samples were given today of the bystolic

## 2014-09-20 NOTE — Progress Notes (Addendum)
Patient ID: Jacqueline Peterson, female    DOB: [birth year], 28 y.o.   MRN: [id number]  HPI Comments: Jacqueline Peterson is a very pleasant 28 year old woman with obesity, insomnia, who presents for evaluation of palpitations. She works at [employer]  She reports that over the past 2 months or so she has noticed increasing symptomatic palpitations. He will come on in the day as well as in the evening, described as palpitations lasting for 1 minute with recurrent episodes over and over. She also has some discomfort in her chest with pain rating around the left breast up into the left neck. This is likely separate from her palpitation symptoms. Sometimes notices very strong heartbeats.  She started herself on NiaspanTo see if this would help her symptoms. She stopped many of her over-the-counter vitamins and supplements to see if this would help with no relief. She denies any significant shortness of breath with day-to-day exertion.  She does have some significant stressors, hoping to take care of her grandfather.  EKG done in the office today shows normal sinus rhythm with rate 86 bpm, no significant ST or T-wave changes   Outpatient Encounter Prescriptions as of 09/20/2014  Medication Sig  . ALPRAZolam (XANAX) 0.25 MG tablet Take 1 tablet (0.25 mg total) by mouth at bedtime as needed.  . loratadine (CLARITIN) 10 MG tablet Take 10 mg by mouth daily.  . niacin 500 MG tablet Take 500 mg by mouth at bedtime.  . valACYclovir (VALTREX) 1000 MG tablet Take 1 tablet (1,000 mg total) by mouth 2 (two) times daily.   In terms in her social hx,   reports that she has been smoking.  She has never used smokeless tobacco. She reports that she drinks alcohol. She reports that she does not use illicit drugs.   In terms og her family hx: family history includes ADD / ADHD in her sister; Aneurysm in her maternal grandmother; Anxiety disorder in her maternal uncle; Basal cell carcinoma in her mother; Cancer  in her other; Cancer (age of onset: 67) in her father; Diabetes in her maternal grandmother and paternal grandfather; Early death in her father; Fibroids in her mother; Hypertension in her father, maternal grandfather, maternal uncle, paternal grandfather, and paternal grandmother; Kidney disease in her maternal uncle; Lymphoma in her paternal grandfather; Pancreatitis in her maternal grandmother; Rheum arthritis in her other; Rheumatic fever in her maternal grandfather; Stroke in her paternal grandmother; Thyroid disease in her mother.  Past Medical History  Diagnosis Date  . History of chicken pox     Review of Systems  Constitutional: Negative.   Respiratory: Positive for chest tightness.   Cardiovascular: Positive for chest pain and palpitations.  Endocrine: Negative.   Neurological: Negative.   All other systems reviewed and are negative.   BP 124/78 mmHg  Pulse 86  Ht 5' 2.75" (1.594 m)  Wt 226 lb 8 oz (102.74 kg)  BMI 40.44 kg/m2  LMP 08/06/2014  Physical Exam  Constitutional: She is oriented to person, place, and time. She appears well-developed and well-nourished.  HENT:  Head: Normocephalic.  Nose: Nose normal.  Mouth/Throat: Oropharynx is clear and moist.  Eyes: Conjunctivae are normal. Pupils are equal, round, and reactive to light.  Neck: Normal range of motion. Neck supple. No JVD present.  Cardiovascular: Normal rate, regular rhythm, S1 normal, S2 normal, normal heart sounds and intact distal pulses.  Exam reveals no gallop and no friction rub.   No murmur heard. Pulmonary/Chest: Effort normal and breath  sounds normal. No respiratory distress. She has no wheezes. She has no rales. She exhibits no tenderness.  Abdominal: Soft. Bowel sounds are normal. She exhibits no distension. There is no tenderness.  Musculoskeletal: Normal range of motion. She exhibits no edema or tenderness.  Lymphadenopathy:    She has no cervical adenopathy.  Neurological: She is alert and  oriented to person, place, and time. Coordination normal.  Skin: Skin is warm and dry. No rash noted. No erythema.  Psychiatric: She has a normal mood and affect. Her behavior is normal. Judgment and thought content normal.    Assessment and Plan  Nursing note and vitals reviewed.

## 2014-10-18 ENCOUNTER — Ambulatory Visit: Payer: 59 | Admitting: Internal Medicine

## 2014-10-25 ENCOUNTER — Ambulatory Visit (INDEPENDENT_AMBULATORY_CARE_PROVIDER_SITE_OTHER): Payer: 59 | Admitting: Internal Medicine

## 2014-10-25 ENCOUNTER — Encounter: Payer: Self-pay | Admitting: Internal Medicine

## 2014-10-25 VITALS — BP 101/70 | HR 66 | Temp 98.9°F | Ht 63.0 in | Wt 230.8 lb

## 2014-10-25 DIAGNOSIS — K219 Gastro-esophageal reflux disease without esophagitis: Secondary | ICD-10-CM

## 2014-10-25 MED ORDER — PANTOPRAZOLE SODIUM 40 MG PO TBEC: 40.0000 mg | DELAYED_RELEASE_TABLET | Freq: Every day | ORAL | Status: DC

## 2014-10-25 MED ORDER — PANTOPRAZOLE SODIUM 40 MG PO TBEC: 40.0000 mg | DELAYED_RELEASE_TABLET | Freq: Two times a day (BID) | ORAL | Status: DC

## 2014-10-25 NOTE — Patient Instructions (Signed)
We will check for H. Pylori today.  Start Pantoprazole 40mg  twice daily.  Follow up 2 weeks.

## 2014-10-25 NOTE — Progress Notes (Signed)
   Subjective:    Patient ID: Jacqueline Peterson, female    DOB: 1986-04-06, 28 y.o.   MRN: [id number]  HPI  28YO female presents for acute visit.  Heart burn - Developed worsening reflux symptoms over last few weeks. Improves with Tums for short period. No improvement with OTC nexium. Worsened with spicy foods. Burning epigastric area. No black or bloody stool. Prior to this, had some watery diarrhea. This has resolved.  Past medical, surgical, family and social history per today's encounter.  Review of Systems  Constitutional: Negative for fever, chills, appetite change, fatigue and unexpected weight change.  Eyes: Negative for visual disturbance.  Respiratory: Negative for shortness of breath.   Cardiovascular: Negative for chest pain and leg swelling.  Gastrointestinal: Positive for abdominal pain. Negative for nausea, vomiting, diarrhea, constipation and abdominal distention.  Skin: Negative for color change and rash.  Hematological: Negative for adenopathy. Does not bruise/bleed easily.  Psychiatric/Behavioral: Negative for dysphoric mood. The patient is not nervous/anxious.        Objective:    BP 101/70 mmHg  Pulse 66  Temp(Src) 98.9 F (37.2 C) (Oral)  Ht 5\' 3"  (1.6 m)  Wt 230 lb 12 oz (104.668 kg)  BMI 40.89 kg/m2  SpO2 100% Physical Exam  Constitutional: She is oriented to person, place, and time. She appears well-developed and well-nourished. No distress.  HENT:  Head: Normocephalic and atraumatic.  Right Ear: External ear normal.  Left Ear: External ear normal.  Nose: Nose normal.  Mouth/Throat: Oropharynx is clear and moist. No oropharyngeal exudate.  Eyes: Conjunctivae and EOM are normal. Pupils are equal, round, and reactive to light. Right eye exhibits no discharge.  Neck: Normal range of motion. Neck supple. No thyromegaly present.  Cardiovascular: Normal rate, regular rhythm, normal heart sounds and intact distal pulses.  Exam reveals no gallop and no  friction rub.   No murmur heard. Pulmonary/Chest: Effort normal. No respiratory distress. She has no wheezes. She has no rales.  Abdominal: Soft. Bowel sounds are normal. She exhibits no distension and no mass. There is no tenderness. There is no rebound and no guarding.  Musculoskeletal: Normal range of motion. She exhibits no edema or tenderness.  Lymphadenopathy:    She has no cervical adenopathy.  Neurological: She is alert and oriented to person, place, and time. No cranial nerve deficit. Coordination normal.  Skin: Skin is warm and dry. No rash noted. She is not diaphoretic. No erythema. No pallor.  Psychiatric: She has a normal mood and affect. Her behavior is normal. Judgment and thought content normal.          Assessment & Plan:   Problem List Items Addressed This Visit      Unprioritized   Esophageal reflux - Primary    Symptoms consistent with GERD, likely exacerbated by smoking and obesity. However, given sudden worsening of symptoms, will check for H. Pylori infection. Encouraged avoidance of spicy foods, elevating head of bed. Start Pantoprazole 40mg  po bid. Follow up in 1-2 weeks. If no improvement, we discussed GI referral for endoscopy.    Relevant Medications      pantoprazole (PROTONIX) EC tablet   Other Relevant Orders      H. pylori breath test       Return in about 2 weeks (around 11/08/2014) for Recheck.

## 2014-10-25 NOTE — Assessment & Plan Note (Signed)
Symptoms consistent with GERD, likely exacerbated by smoking and obesity. However, given sudden worsening of symptoms, will check for H. Pylori infection. Encouraged avoidance of spicy foods, elevating head of bed. Start Pantoprazole 40mg  po bid. Follow up in 1-2 weeks. If no improvement, we discussed GI referral for endoscopy.

## 2014-10-25 NOTE — Progress Notes (Signed)
Pre visit review using our clinic review tool, if applicable. No additional management support is needed unless otherwise documented below in the visit note. 

## 2014-10-26 LAB — H. PYLORI BREATH TEST: H. PYLORI BREATH TEST: NOT DETECTED

## 2014-12-06 ENCOUNTER — Encounter: Payer: Self-pay | Admitting: Internal Medicine

## 2014-12-06 ENCOUNTER — Ambulatory Visit (INDEPENDENT_AMBULATORY_CARE_PROVIDER_SITE_OTHER): Payer: 59 | Admitting: Internal Medicine

## 2014-12-06 VITALS — BP 120/81 | HR 89 | Temp 98.2°F | Ht 63.0 in | Wt 237.0 lb

## 2014-12-06 DIAGNOSIS — K219 Gastro-esophageal reflux disease without esophagitis: Secondary | ICD-10-CM

## 2014-12-06 DIAGNOSIS — F419 Anxiety disorder, unspecified: Secondary | ICD-10-CM

## 2014-12-06 MED ORDER — PANTOPRAZOLE SODIUM 40 MG PO TBEC: 40.0000 mg | DELAYED_RELEASE_TABLET | Freq: Two times a day (BID) | ORAL | Status: DC

## 2014-12-06 MED ORDER — ALPRAZOLAM 0.25 MG PO TABS: 0.2500 mg | ORAL_TABLET | Freq: Every evening | ORAL | Status: DC | PRN

## 2014-12-06 NOTE — Patient Instructions (Signed)
Continue Pantoprazole.  Call if any recurrent symptoms.

## 2014-12-06 NOTE — Assessment & Plan Note (Signed)
Symptoms resolved with pantoprazole. Will try to limit dose to once daily when possible. Follow up if any recurrent symptoms.

## 2014-12-06 NOTE — Progress Notes (Signed)
   Subjective:    Patient ID: Jacqueline Peterson, female    DOB: 1986/08/17, 29 y.o.   MRN: [id number]  HPI 29YO female presents for follow up.  Last seen 12/10 - epigastric pain and GERD. Started on Pantoprazole bid. Testing for H. Pylori was negative. Symptoms have resolved on pantoprazole, however recur if misses a day of medication. No new concerns today.   Past medical, surgical, family and social history per today's encounter.  Review of Systems  Constitutional: Negative for fever, chills, appetite change, fatigue and unexpected weight change.  Eyes: Negative for visual disturbance.  Respiratory: Negative for shortness of breath.   Cardiovascular: Negative for chest pain and leg swelling.  Gastrointestinal: Negative for nausea, vomiting, abdominal pain, diarrhea, constipation and blood in stool.  Musculoskeletal: Negative for myalgias and arthralgias.  Skin: Negative for color change and rash.  Hematological: Negative for adenopathy. Does not bruise/bleed easily.  Psychiatric/Behavioral: Negative for sleep disturbance and dysphoric mood. The patient is not nervous/anxious.        Objective:    BP 120/81 mmHg  Pulse 89  Temp(Src) 98.2 F (36.8 C) (Oral)  Ht 5\' 3"  (1.6 m)  Wt 237 lb (107.502 kg)  BMI 41.99 kg/m2  SpO2 100%  LMP 12/04/2014 Physical Exam  Constitutional: She is oriented to person, place, and time. She appears well-developed and well-nourished. No distress.  HENT:  Head: Normocephalic and atraumatic.  Right Ear: External ear normal.  Left Ear: External ear normal.  Nose: Nose normal.  Mouth/Throat: Oropharynx is clear and moist. No oropharyngeal exudate.  Eyes: Conjunctivae are normal. Pupils are equal, round, and reactive to light. Right eye exhibits no discharge. Left eye exhibits no discharge. No scleral icterus.  Neck: Normal range of motion. Neck supple. No tracheal deviation present. No thyromegaly present.  Cardiovascular: Normal rate, regular  rhythm, normal heart sounds and intact distal pulses.  Exam reveals no gallop and no friction rub.   No murmur heard. Pulmonary/Chest: Effort normal and breath sounds normal. No accessory muscle usage. No tachypnea. No respiratory distress. She has no decreased breath sounds. She has no wheezes. She has no rhonchi. She has no rales. She exhibits no tenderness.  Musculoskeletal: Normal range of motion. She exhibits no edema or tenderness.  Lymphadenopathy:    She has no cervical adenopathy.  Neurological: She is alert and oriented to person, place, and time. No cranial nerve deficit. She exhibits normal muscle tone. Coordination normal.  Skin: Skin is warm and dry. No rash noted. She is not diaphoretic. No erythema. No pallor.  Psychiatric: She has a normal mood and affect. Her behavior is normal. Judgment and thought content normal.          Assessment & Plan:   Problem List Items Addressed This Visit      Unprioritized   Anxiety   Relevant Medications   ALPRAZolam  (XANAX) tablet   Esophageal reflux - Primary    Symptoms resolved with pantoprazole. Will try to limit dose to once daily when possible. Follow up if any recurrent symptoms.       Relevant Medications   pantoprazole (PROTONIX) EC tablet       Return in about 4 months (around 04/06/2015) for Physical.

## 2014-12-06 NOTE — Progress Notes (Signed)
Pre visit review using our clinic review tool, if applicable. No additional management support is needed unless otherwise documented below in the visit note. 

## 2015-01-23 ENCOUNTER — Ambulatory Visit: Payer: Self-pay | Admitting: Internal Medicine

## 2015-06-28 ENCOUNTER — Other Ambulatory Visit: Payer: Self-pay | Admitting: *Deleted

## 2015-06-28 ENCOUNTER — Encounter: Payer: Self-pay | Admitting: Internal Medicine

## 2015-06-28 ENCOUNTER — Other Ambulatory Visit (HOSPITAL_COMMUNITY)
Admission: RE | Admit: 2015-06-28 | Discharge: 2015-06-28 | Disposition: A | Discharge status: Home / Self Care | Payer: 59 | Source: Ambulatory Visit | Attending: Internal Medicine | Admitting: Internal Medicine

## 2015-06-28 ENCOUNTER — Ambulatory Visit (INDEPENDENT_AMBULATORY_CARE_PROVIDER_SITE_OTHER): Payer: 59 | Admitting: Internal Medicine

## 2015-06-28 ENCOUNTER — Other Ambulatory Visit: Payer: Self-pay | Admitting: Internal Medicine

## 2015-06-28 VITALS — BP 144/68 | HR 58 | Temp 98.3°F | Ht 63.75 in | Wt 252.0 lb

## 2015-06-28 DIAGNOSIS — IMO0001 Reserved for inherently not codable concepts without codable children: Secondary | ICD-10-CM

## 2015-06-28 DIAGNOSIS — Z01419 Encounter for gynecological examination (general) (routine) without abnormal findings: Secondary | ICD-10-CM | POA: Diagnosis not present

## 2015-06-28 DIAGNOSIS — R030 Elevated blood-pressure reading, without diagnosis of hypertension: Secondary | ICD-10-CM

## 2015-06-28 DIAGNOSIS — Z1151 Encounter for screening for human papillomavirus (HPV): Secondary | ICD-10-CM | POA: Diagnosis present

## 2015-06-28 DIAGNOSIS — Z0000 Encounter for general adult medical examination without abnormal findings: Secondary | ICD-10-CM | POA: Diagnosis not present

## 2015-06-28 DIAGNOSIS — F419 Anxiety disorder, unspecified: Secondary | ICD-10-CM

## 2015-06-28 DIAGNOSIS — B009 Herpesviral infection, unspecified: Secondary | ICD-10-CM

## 2015-06-28 DIAGNOSIS — K219 Gastro-esophageal reflux disease without esophagitis: Secondary | ICD-10-CM

## 2015-06-28 DIAGNOSIS — E039 Hypothyroidism, unspecified: Secondary | ICD-10-CM

## 2015-06-28 LAB — LIPID PANEL
CHOLESTEROL: 196 mg/dL (ref 0–200)
HDL: 42 mg/dL (ref >39.00)
LDL Cholesterol: 137 mg/dL — ABNORMAL HIGH (ref 0–99)
NonHDL: 154.26
TRIGLYCERIDES: 88 mg/dL (ref 0.0–149.0)
Total CHOL/HDL Ratio: 5
VLDL: 17.6 mg/dL (ref 0.0–40.0)

## 2015-06-28 LAB — COMPREHENSIVE METABOLIC PANEL
ALBUMIN: 4 g/dL (ref 3.5–5.2)
ALT: 14 U/L (ref 0–35)
AST: 22 U/L (ref 0–37)
Alkaline Phosphatase: 63 U/L (ref 39–117)
BUN: 12 mg/dL (ref 6–23)
CALCIUM: 9.3 mg/dL (ref 8.4–10.5)
CO2: 24 mEq/L (ref 19–32)
Chloride: 103 mEq/L (ref 96–112)
Creatinine, Ser: 0.79 mg/dL (ref 0.40–1.20)
GFR: 90.44 mL/min (ref >60.00)
Glucose, Bld: 91 mg/dL (ref 70–99)
POTASSIUM: 4.2 meq/L (ref 3.5–5.1)
Sodium: 136 mEq/L (ref 135–145)
TOTAL PROTEIN: 7 g/dL (ref 6.0–8.3)
Total Bilirubin: 0.3 mg/dL (ref 0.2–1.2)

## 2015-06-28 LAB — CBC WITH DIFFERENTIAL/PLATELET
BASOS ABS: 0.1 10*3/uL (ref 0.0–0.1)
BASOS PCT: 0.9 % (ref 0.0–3.0)
EOS ABS: 0.1 10*3/uL (ref 0.0–0.7)
Eosinophils Relative: 1.7 % (ref 0.0–5.0)
HCT: 41.8 % (ref 36.0–46.0)
HEMOGLOBIN: 13.9 g/dL (ref 12.0–15.0)
Lymphocytes Relative: 33.5 % (ref 12.0–46.0)
Lymphs Abs: 2.3 10*3/uL (ref 0.7–4.0)
MCHC: 33.2 g/dL (ref 30.0–36.0)
MCV: 90.3 fl (ref 78.0–100.0)
Monocytes Absolute: 0.5 10*3/uL (ref 0.1–1.0)
Monocytes Relative: 7.3 % (ref 3.0–12.0)
NEUTROS PCT: 56.6 % (ref 43.0–77.0)
Neutro Abs: 3.9 10*3/uL (ref 1.4–7.7)
Platelets: 261 10*3/uL (ref 150.0–400.0)
RBC: 4.63 Mil/uL (ref 3.87–5.11)
RDW: 13.8 % (ref 11.5–15.5)
WBC: 6.9 10*3/uL (ref 4.0–10.5)

## 2015-06-28 LAB — TSH: TSH: 6.59 u[IU]/mL — AB (ref 0.35–4.50)

## 2015-06-28 LAB — HEMOGLOBIN A1C: HEMOGLOBIN A1C: 5.3 % (ref 4.6–6.5)

## 2015-06-28 MED ORDER — LEVOTHYROXINE SODIUM 50 MCG PO TABS: 50.0000 ug | ORAL_TABLET | Freq: Every day | ORAL | Status: DC

## 2015-06-28 MED ORDER — ALPRAZOLAM 0.25 MG PO TABS: 0.2500 mg | ORAL_TABLET | Freq: Every evening | ORAL | Status: DC | PRN

## 2015-06-28 MED ORDER — VALACYCLOVIR HCL 1 G PO TABS: 1000.0000 mg | ORAL_TABLET | Freq: Two times a day (BID) | ORAL | Status: DC

## 2015-06-28 MED ORDER — PANTOPRAZOLE SODIUM 40 MG PO TBEC: 40.0000 mg | DELAYED_RELEASE_TABLET | Freq: Two times a day (BID) | ORAL | Status: DC

## 2015-06-28 NOTE — Assessment & Plan Note (Signed)
Wt Readings from Last 3 Encounters:  06/28/15 252 lb (114.306 kg)  12/06/14 237 lb (107.502 kg)  10/25/14 230 lb 12 oz (104.668 kg)   Discussed options for appetite suppression. She is not a candidate for phentermine because of HTN. She will look into coverage for Saxenda and Belviq.

## 2015-06-28 NOTE — Progress Notes (Signed)
Subjective:    Patient ID: Jacqueline Peterson, female    DOB: [birth year], 29 y.o.   MRN: [id number]  HPI  28YO female presents for physical exam.  Feeling well. No new concerns, except for noting some weight gain after stopping smoking. Would like to consider new medication to lower appetite. Started new job at [employer].  Past Medical History  Diagnosis Date  . History of chicken pox    Family History  Problem Relation Age of Onset  . Fibroids Mother   . Thyroid disease Mother     Hypo  . Basal cell carcinoma Mother   . Cancer Father 60    Non Hodgkins Adreanal carcinoma, Non small cell  . Hypertension Father   . Early death Father   . ADD / ADHD Sister   . Kidney disease Maternal Uncle   . Hypertension Maternal Uncle   . Anxiety disorder Maternal Uncle   . Diabetes Maternal Grandmother     Type 1  . Pancreatitis Maternal Grandmother   . Aneurysm Maternal Grandmother     brain  . Hypertension Maternal Grandfather   . Rheumatic fever Maternal Grandfather   . Hypertension Paternal Grandmother   . Stroke Paternal Grandmother   . Lymphoma Paternal Grandfather   . Hypertension Paternal Grandfather   . Diabetes Paternal Grandfather   . Rheum arthritis Other   . Cancer Other     60's Breast Ca   History reviewed. No pertinent past surgical history. Social History   Social History  . Marital Status: Single    Spouse Name: N/A  . Number of Children: 0  . Years of Education: N/A   Occupational History  . [place]    Social History Main Topics  . Smoking status: Former Smoker -- 0.50 packs/day for 6 years    Quit date: 12/24/2014  . Smokeless tobacco: Never Used  . Alcohol Use: 0.0 oz/week    0 Standard drinks or equivalent per week     Comment: Occasional  . Drug Use: No  . Sexual Activity: Not Asked   Other Topics Concern  . None   Social History Narrative   Lives in [place] with mother. Dog and cat in house. 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      Regular Exercise -  NO   Daily Caffeine Use:  1 tea    Review of Systems  Constitutional: Negative for fever, chills, appetite change, fatigue and unexpected weight change.  Eyes: Negative for visual disturbance.  Respiratory: Negative for shortness of breath.   Cardiovascular: Negative for chest pain and leg swelling.  Gastrointestinal: Negative for nausea, vomiting, abdominal pain, diarrhea and constipation.  Musculoskeletal: Negative for myalgias and arthralgias.  Skin: Negative for color change and rash.  Hematological: Negative for adenopathy. Does not bruise/bleed easily.  Psychiatric/Behavioral: Negative for sleep disturbance and dysphoric mood. The patient is not nervous/anxious.        Objective:    BP 144/68 mmHg  Pulse 58  Temp(Src) 98.3 F (36.8 C) (Oral)  Ht 5' 3.75" (1.619 m)  Wt 252 lb (114.306 kg)  BMI 43.61 kg/m2  SpO2 100%  LMP 06/17/2015 Physical Exam  Constitutional: She is oriented to person, place, and time. She appears well-developed and well-nourished. No distress.  HENT:  Head: Normocephalic and atraumatic.  Right Ear: External ear normal.  Left Ear: External ear normal.  Nose: Nose normal.  Mouth/Throat: Oropharynx is clear and moist. No oropharyngeal exudate.  Eyes:  Conjunctivae are normal. Pupils are equal, round, and reactive to light. Right eye exhibits no discharge. Left eye exhibits no discharge. No scleral icterus.  Neck: Normal range of motion. Neck supple. No tracheal deviation present. No thyromegaly present.  Cardiovascular: Normal rate, regular rhythm, normal heart sounds and intact distal pulses.  Exam reveals no gallop and no friction rub.   No murmur heard. Pulmonary/Chest: Effort normal and breath sounds normal. No respiratory distress. She has no wheezes. She has no rales. She exhibits no tenderness.  Abdominal: Soft. Bowel sounds are normal. She exhibits no distension and no mass. There is no tenderness. There is  no rebound and no guarding.  Genitourinary: Rectum normal, vagina normal and uterus normal. No breast swelling, tenderness, discharge or bleeding. Pelvic exam was performed with patient supine. There is no rash, tenderness or lesion on the right labia. There is no rash, tenderness or lesion on the left labia. Uterus is not enlarged and not tender. Cervix exhibits no motion tenderness, no discharge and no friability. Right adnexum displays no mass, no tenderness and no fullness. Left adnexum displays no mass, no tenderness and no fullness. No erythema or tenderness in the vagina. No vaginal discharge found.  Musculoskeletal: Normal range of motion. She exhibits no edema or tenderness.  Lymphadenopathy:    She has no cervical adenopathy.  Neurological: She is alert and oriented to person, place, and time. No cranial nerve deficit. She exhibits normal muscle tone. Coordination normal.  Skin: Skin is warm and dry. No rash noted. She is not diaphoretic. No erythema. No pallor.  Psychiatric: She has a normal mood and affect. Her behavior is normal. Judgment and thought content normal.          Assessment & Plan:   Problem List Items Addressed This Visit      Unprioritized   Anxiety   Relevant Medications   ALPRAZolam (XANAX) 0.25 MG tablet   Elevated BP    BP Readings from Last 3 Encounters:  06/28/15 144/68  12/06/14 120/81  10/25/14 101/70   BP elevated today. Well controlled in the past. Will have her check BP at work and email with readings.      Esophageal reflux   Relevant Medications   Probiotic Product (PROBIOTIC DAILY PO)   pantoprazole (PROTONIX) 40 MG tablet   Herpes   Relevant Medications   valACYclovir (VALTREX) 1000 MG tablet   Routine general medical examination at a health care facility - Primary    General medical exam normal today including breast and pelvic exam. PAP pending. Labs as ordered. Immunizations are UTD. Encouraged healthy diet and exercise.       Relevant Orders   Comprehensive metabolic panel   Hemoglobin A1c   CBC with Differential/Platelet   Lipid panel   TSH   Severe obesity (BMI >= 40)    Wt Readings from Last 3 Encounters:  06/28/15 252 lb (114.306 kg)  12/06/14 237 lb (107.502 kg)  10/25/14 230 lb 12 oz (104.668 kg)   Discussed options for appetite suppression. She is not a candidate for phentermine because of HTN. She will look into coverage for Saxenda and Belviq.          Return in about 1 year (around 06/27/2016) for Physical.

## 2015-06-28 NOTE — Patient Instructions (Signed)

## 2015-06-28 NOTE — Addendum Note (Signed)
Addended by: Karlene Einstein D on: 06/28/2015 10:36 AM   Modules accepted: Orders

## 2015-06-28 NOTE — Assessment & Plan Note (Signed)
BP Readings from Last 3 Encounters:  06/28/15 144/68  12/06/14 120/81  10/25/14 101/70   BP elevated today. Well controlled in the past. Will have her check BP at work and email with readings.

## 2015-06-28 NOTE — Progress Notes (Signed)
Pre visit review using our clinic review tool, if applicable. No additional management support is needed unless otherwise documented below in the visit note. 

## 2015-06-28 NOTE — Assessment & Plan Note (Addendum)
General medical exam normal today including breast and pelvic exam. PAP pending. Labs as ordered. Immunizations are UTD. Encouraged healthy diet and exercise.

## 2015-07-01 LAB — CYTOLOGY - PAP

## 2015-07-26 ENCOUNTER — Other Ambulatory Visit: Payer: Self-pay | Admitting: Internal Medicine

## 2015-08-13 ENCOUNTER — Other Ambulatory Visit (INDEPENDENT_AMBULATORY_CARE_PROVIDER_SITE_OTHER): Payer: 59

## 2015-08-13 DIAGNOSIS — R002 Palpitations: Secondary | ICD-10-CM | POA: Diagnosis not present

## 2015-08-13 DIAGNOSIS — R0789 Other chest pain: Secondary | ICD-10-CM

## 2015-08-13 DIAGNOSIS — E039 Hypothyroidism, unspecified: Secondary | ICD-10-CM

## 2015-08-13 LAB — TSH: TSH: 6.94 u[IU]/mL — AB (ref 0.35–4.50)

## 2015-08-15 ENCOUNTER — Encounter: Payer: Self-pay | Admitting: *Deleted

## 2015-09-02 ENCOUNTER — Ambulatory Visit (INDEPENDENT_AMBULATORY_CARE_PROVIDER_SITE_OTHER): Payer: 59 | Admitting: Internal Medicine

## 2015-09-02 ENCOUNTER — Telehealth: Payer: Self-pay

## 2015-09-02 ENCOUNTER — Encounter: Payer: Self-pay | Admitting: Internal Medicine

## 2015-09-02 DIAGNOSIS — R030 Elevated blood-pressure reading, without diagnosis of hypertension: Secondary | ICD-10-CM

## 2015-09-02 DIAGNOSIS — B009 Herpesviral infection, unspecified: Secondary | ICD-10-CM

## 2015-09-02 DIAGNOSIS — K219 Gastro-esophageal reflux disease without esophagitis: Secondary | ICD-10-CM | POA: Diagnosis not present

## 2015-09-02 DIAGNOSIS — IMO0001 Reserved for inherently not codable concepts without codable children: Secondary | ICD-10-CM

## 2015-09-02 LAB — POCT URINE PREGNANCY: Preg Test, Ur: NEGATIVE

## 2015-09-02 MED ORDER — PANTOPRAZOLE SODIUM 40 MG PO TBEC: 40.0000 mg | DELAYED_RELEASE_TABLET | Freq: Two times a day (BID) | ORAL | Status: DC

## 2015-09-02 MED ORDER — VALACYCLOVIR HCL 1 G PO TABS: 1000.0000 mg | ORAL_TABLET | Freq: Two times a day (BID) | ORAL | Status: DC

## 2015-09-02 MED ORDER — LEVOTHYROXINE SODIUM 75 MCG PO TABS
ORAL_TABLET | ORAL | Status: DC
Start: 2015-09-02 — End: 2015-10-07

## 2015-09-02 MED ORDER — LIRAGLUTIDE -WEIGHT MANAGEMENT 18 MG/3ML ~~LOC~~ SOPN: 0.6000 mg | PEN_INJECTOR | Freq: Every day | SUBCUTANEOUS | Status: DC

## 2015-09-02 NOTE — Progress Notes (Signed)
Subjective:    Patient ID: Jacqueline Peterson, female    DOB: 12-26-1985, 29 y.o.   MRN: [id number]  HPI  28YO female presents for follow up.  Recent TSH elevated 6.94. Compliant with medication.  Elevated BP - Does not check. No CP, palpitations.  Obesity - Would like to lose weight, however has gained weight since stopping smoking. Not following any specific diet or exercise program.    Wt Readings from Last 3 Encounters:  09/02/15 262 lb 4 oz (118.956 kg)  06/28/15 252 lb (114.306 kg)  12/06/14 237 lb (107.502 kg)   BP Readings from Last 3 Encounters:  09/02/15 146/80  06/28/15 144/68  12/06/14 120/81    Past Medical History  Diagnosis Date  . History of chicken pox    Family History  Problem Relation Age of Onset  . Fibroids Mother   . Thyroid disease Mother     Hypo  . Basal cell carcinoma Mother   . Cancer Father 32    Non Hodgkins Adreanal carcinoma, Non small cell  . Hypertension Father   . Early death Father   . ADD / ADHD Sister   . Kidney disease Maternal Uncle   . Hypertension Maternal Uncle   . Anxiety disorder Maternal Uncle   . Diabetes Maternal Grandmother     Type 1  . Pancreatitis Maternal Grandmother   . Aneurysm Maternal Grandmother     brain  . Hypertension Maternal Grandfather   . Rheumatic fever Maternal Grandfather   . Hypertension Paternal Grandmother   . Stroke Paternal Grandmother   . Lymphoma Paternal Grandfather   . Hypertension Paternal Grandfather   . Diabetes Paternal Grandfather   . Rheum arthritis Other   . Cancer Other     60's Breast Ca   No past surgical history on file. Social History   Social History  . Marital Status: Single    Spouse Name: N/A  . Number of Children: 0  . Years of Education: N/A   Occupational History  . [employer]    Social History Main Topics  . Smoking status: Former Smoker -- 0.50 packs/day for 6 years    Quit date: 12/24/2014  . Smokeless tobacco: Never Used  .  Alcohol Use: 0.0 oz/week    0 Standard drinks or equivalent per week     Comment: Occasional  . Drug Use: No  . Sexual Activity: Not Asked   Other Topics Concern  . None   Social History Narrative   Lives in [place] with husband. Dog and cat in house. Works at [employer].      Regular Exercise -  NO   Daily Caffeine Use:  1 tea    Review of Systems  Constitutional: Positive for fatigue. Negative for fever, chills, appetite change and unexpected weight change.  Eyes: Negative for visual disturbance.  Respiratory: Negative for shortness of breath.   Cardiovascular: Negative for chest pain and leg swelling.  Gastrointestinal: Negative for nausea, vomiting, abdominal pain, diarrhea and constipation.  Musculoskeletal: Negative for myalgias and arthralgias.  Skin: Negative for color change and rash.  Hematological: Negative for adenopathy. Does not bruise/bleed easily.  Psychiatric/Behavioral: Negative for sleep disturbance and dysphoric mood. The patient is not nervous/anxious.        Objective:    BP 146/80 mmHg  Pulse 74  Temp(Src) 98.3 F (36.8 C) (Oral)  Ht 5' 3.75" (1.619 m)  Wt 262 lb 4 oz (118.956 kg)  BMI 45.38 kg/m2  SpO2 99%  LMP 08/05/2015 Physical Exam  Constitutional: She is oriented to person, place, and time. She appears well-developed and well-nourished. No distress.  HENT:  Head: Normocephalic and atraumatic.  Right Ear: External ear normal.  Left Ear: External ear normal.  Nose: Nose normal.  Mouth/Throat: Oropharynx is clear and moist. No oropharyngeal exudate.  Eyes: Conjunctivae are normal. Pupils are equal, round, and reactive to light. Right eye exhibits no discharge. Left eye exhibits no discharge. No scleral icterus.  Neck: Normal range of motion. Neck supple. No tracheal deviation present. No thyromegaly present.  Cardiovascular: Normal rate, regular rhythm, normal heart sounds and intact distal pulses.  Exam reveals no gallop and no  friction rub.   No murmur heard. Pulmonary/Chest: Effort normal and breath sounds normal. No respiratory distress. She has no wheezes. She has no rales. She exhibits no tenderness.  Musculoskeletal: Normal range of motion. She exhibits no edema or tenderness.  Lymphadenopathy:    She has no cervical adenopathy.  Neurological: She is alert and oriented to person, place, and time. No cranial nerve deficit. She exhibits normal muscle tone. Coordination normal.  Skin: Skin is warm and dry. No rash noted. She is not diaphoretic. No erythema. No pallor.  Psychiatric: She has a normal mood and affect. Her behavior is normal. Judgment and thought content normal.          Assessment & Plan:   Problem List Items Addressed This Visit      Unprioritized   Elevated BP    BP Readings from Last 3 Encounters:  09/02/15 146/80  06/28/15 144/68  12/06/14 120/81   BP elevated. Discussed option of starting medication. We will focus on weight loss for now and start Saxenda. Follow up in 4 weeks.      Esophageal reflux    Continue Pantoprazole.      Relevant Medications   pantoprazole (PROTONIX) 40 MG tablet   Herpes    Continue prn Valtrex.      Relevant Medications   valACYclovir (VALTREX) 1000 MG tablet   Severe obesity (BMI >= 40) (HCC) - Primary    Wt Readings from Last 3 Encounters:  09/02/15 262 lb 4 oz (118.956 kg)  06/28/15 252 lb (114.306 kg)  12/06/14 237 lb (107.502 kg)   Encouraged healthy diet and exercise. Discussed some options for appetite suppression. Discussed Saxenda. Will start Saxenda. Discussed that she will need to use contraception while on this medication, as it is Pregnancy Category X. Discussed other potential side effects. Follow up in 4 weeks.      Relevant Medications   Liraglutide -Weight Management (SAXENDA) 18 MG/3ML SOPN       Return in about 4 weeks (around 09/30/2015) for Recheck.

## 2015-09-02 NOTE — Addendum Note (Signed)
Addended by: Ronette Deter A on: 09/02/2015 09:04 AM   Modules accepted: Orders

## 2015-09-02 NOTE — Assessment & Plan Note (Signed)
Wt Readings from Last 3 Encounters:  09/02/15 262 lb 4 oz (118.956 kg)  06/28/15 252 lb (114.306 kg)  12/06/14 237 lb (107.502 kg)   Encouraged healthy diet and exercise. Discussed some options for appetite suppression. Discussed Saxenda. Will start Saxenda. Discussed that she will need to use contraception while on this medication, as it is Pregnancy Category X. Discussed other potential side effects. Follow up in 4 weeks.

## 2015-09-02 NOTE — Assessment & Plan Note (Signed)
Continue Pantoprazole.

## 2015-09-02 NOTE — Progress Notes (Signed)
Pre visit review using our clinic review tool, if applicable. No additional management support is needed unless otherwise documented below in the visit note. 

## 2015-09-02 NOTE — Patient Instructions (Signed)
Go to [email or web address] to get coupon.  Start Saxenda 0.6mg  injected daily. Email with update after 1 week.  Use contraception while on this medication.  Follow up in 4 weeks.

## 2015-09-02 NOTE — Assessment & Plan Note (Signed)
Continue prn Valtrex.

## 2015-09-02 NOTE — Telephone Encounter (Signed)
PA for Saxenda started, faxed form back to [employer].

## 2015-09-02 NOTE — Assessment & Plan Note (Signed)
BP Readings from Last 3 Encounters:  09/02/15 146/80  06/28/15 144/68  12/06/14 120/81   BP elevated. Discussed option of starting medication. We will focus on weight loss for now and start Saxenda. Follow up in 4 weeks.

## 2015-10-04 ENCOUNTER — Ambulatory Visit (INDEPENDENT_AMBULATORY_CARE_PROVIDER_SITE_OTHER): Payer: 59 | Admitting: Internal Medicine

## 2015-10-04 ENCOUNTER — Encounter: Payer: Self-pay | Admitting: Internal Medicine

## 2015-10-04 DIAGNOSIS — E039 Hypothyroidism, unspecified: Secondary | ICD-10-CM | POA: Insufficient documentation

## 2015-10-04 LAB — TSH: TSH: 2.19 u[IU]/mL (ref 0.35–4.50)

## 2015-10-04 LAB — T4, FREE: FREE T4: 0.71 ng/dL (ref 0.60–1.60)

## 2015-10-04 NOTE — Patient Instructions (Addendum)
Consider reading the book Always Hungry by Isabella Stalling and Obesity Code by [employer].  Labs today.

## 2015-10-04 NOTE — Assessment & Plan Note (Signed)
Wt Readings from Last 3 Encounters:  10/04/15 263 lb (119.296 kg)  09/02/15 262 lb 4 oz (118.956 kg)  06/28/15 252 lb (114.306 kg)  Body mass index is 45.51 kg/(m^2).  Unable to tolerate Saxenda because of nausea. Discussed some other medications. Also discussed intermittent fasting and high fat low carb diet. Follow up 3 months and prn.

## 2015-10-04 NOTE — Assessment & Plan Note (Signed)
Recently increased dose of Levothyroxine. Will recheck TSH with labs.

## 2015-10-04 NOTE — Progress Notes (Signed)
Subjective:    Patient ID: Jacqueline Peterson, female    DOB: [birth year], 29 y.o.   MRN: [id number]  HPI  28YO female presents for follow up.  Last visit, started on Saxenda.  Had significant nausea on this medication at 1.2mg  dosing. Does not want to try this medication again. Planning to start Weight Watchers with her husband.   Wt Readings from Last 3 Encounters:  10/04/15 263 lb (119.296 kg)  09/02/15 262 lb 4 oz (118.956 kg)  06/28/15 252 lb (114.306 kg)   BP Readings from Last 3 Encounters:  10/04/15 126/83  09/02/15 146/80  06/28/15 144/68    Past Medical History  Diagnosis Date  . History of chicken pox    Family History  Problem Relation Age of Onset  . Fibroids Mother   . Thyroid disease Mother     Hypo  . Basal cell carcinoma Mother   . Cancer Father 90    Non Hodgkins Adreanal carcinoma, Non small cell  . Hypertension Father   . Early death Father   . ADD / ADHD Sister   . Kidney disease Maternal Uncle   . Hypertension Maternal Uncle   . Anxiety disorder Maternal Uncle   . Diabetes Maternal Grandmother     Type 1  . Pancreatitis Maternal Grandmother   . Aneurysm Maternal Grandmother     brain  . Hypertension Maternal Grandfather   . Rheumatic fever Maternal Grandfather   . Hypertension Paternal Grandmother   . Stroke Paternal Grandmother   . Lymphoma Paternal Grandfather   . Hypertension Paternal Grandfather   . Diabetes Paternal Grandfather   . Rheum arthritis Other   . Cancer Other     60's Breast Ca   No past surgical history on file. Social History   Social History  . Marital Status: Single    Spouse Name: N/A  . Number of Children: 0  . Years of Education: N/A   Occupational History  . [employer]    Social History Main Topics  . Smoking status: Former Smoker -- 0.50 packs/day for 6 years    Quit date: 12/24/2014  . Smokeless tobacco: Never Used  . Alcohol Use: 0.0 oz/week    0 Standard drinks or equivalent per week      Comment: Occasional  . Drug Use: No  . Sexual Activity: Not Asked   Other Topics Concern  . None   Social History Narrative   Lives in [place] with husband. Dog and cat in house. Works at [employer].      Regular Exercise -  NO   Daily Caffeine Use:  1 tea    Review of Systems  Constitutional: Negative for fever, chills, appetite change, fatigue and unexpected weight change.  Eyes: Negative for visual disturbance.  Respiratory: Negative for shortness of breath.   Cardiovascular: Negative for chest pain and leg swelling.  Gastrointestinal: Positive for nausea. Negative for vomiting, abdominal pain, diarrhea and constipation.  Musculoskeletal: Negative for myalgias and arthralgias.  Skin: Negative for color change and rash.  Neurological: Negative for weakness.  Hematological: Negative for adenopathy. Does not bruise/bleed easily.  Psychiatric/Behavioral: Negative for suicidal ideas, sleep disturbance and dysphoric mood. The patient is not nervous/anxious.        Objective:    BP 126/83 mmHg  Pulse 88  Temp(Src) 98 F (36.7 C) (Oral)  Ht 5' 3.75" (1.619 m)  Wt 263 lb (119.296 kg)  BMI 45.51 kg/m2  SpO2 100%  LMP  09/04/2015 Physical Exam  Constitutional: She is oriented to person, place, and time. She appears well-developed and well-nourished. No distress.  HENT:  Head: Normocephalic and atraumatic.  Right Ear: External ear normal.  Left Ear: External ear normal.  Nose: Nose normal.  Mouth/Throat: Oropharynx is clear and moist.  Eyes: Conjunctivae are normal. Pupils are equal, round, and reactive to light. Right eye exhibits no discharge. Left eye exhibits no discharge. No scleral icterus.  Neck: Normal range of motion. Neck supple. No tracheal deviation present. No thyromegaly present.  Cardiovascular: Normal rate, regular rhythm, normal heart sounds and intact distal pulses.  Exam reveals no gallop and no friction rub.   No murmur  heard. Pulmonary/Chest: Effort normal and breath sounds normal. No respiratory distress. She has no wheezes. She has no rales. She exhibits no tenderness.  Musculoskeletal: Normal range of motion. She exhibits no edema or tenderness.  Lymphadenopathy:    She has no cervical adenopathy.  Neurological: She is alert and oriented to person, place, and time. No cranial nerve deficit. She exhibits normal muscle tone. Coordination normal.  Skin: Skin is warm and dry. No rash noted. She is not diaphoretic. No erythema. No pallor.  Psychiatric: She has a normal mood and affect. Her behavior is normal. Judgment and thought content normal.          Assessment & Plan:   Problem List Items Addressed This Visit      Unprioritized   Hypothyroidism    Recently increased dose of Levothyroxine. Will recheck TSH with labs.       Relevant Orders   TSH   T4, free   Severe obesity (BMI >= 40) (HCC) - Primary    Wt Readings from Last 3 Encounters:  10/04/15 263 lb (119.296 kg)  09/02/15 262 lb 4 oz (118.956 kg)  06/28/15 252 lb (114.306 kg)  Body mass index is 45.51 kg/(m^2).  Unable to tolerate Saxenda because of nausea. Discussed some other medications. Also discussed intermittent fasting and high fat low carb diet. Follow up 3 months and prn.          Return in about 3 months (around 01/04/2016) for Recheck.

## 2015-10-04 NOTE — Progress Notes (Signed)
Pre visit review using our clinic review tool, if applicable. No additional management support is needed unless otherwise documented below in the visit note. 

## 2015-10-07 ENCOUNTER — Encounter: Payer: Self-pay | Admitting: Internal Medicine

## 2015-10-07 ENCOUNTER — Other Ambulatory Visit: Payer: Self-pay

## 2015-10-07 MED ORDER — LEVOTHYROXINE SODIUM 75 MCG PO TABS: ORAL_TABLET | ORAL | Status: DC

## 2015-10-16 ENCOUNTER — Encounter: Payer: Self-pay | Admitting: Internal Medicine

## 2015-11-14 ENCOUNTER — Ambulatory Visit (INDEPENDENT_AMBULATORY_CARE_PROVIDER_SITE_OTHER): Payer: 59

## 2015-11-14 DIAGNOSIS — Z3201 Encounter for pregnancy test, result positive: Secondary | ICD-10-CM | POA: Diagnosis not present

## 2015-11-14 LAB — POCT PREGNANCY, URINE: PREG TEST UR: POSITIVE — AB

## 2015-11-14 NOTE — Progress Notes (Signed)
Pt here today for a positive pregnancy test.  Pt states that her LMP is 10/08/15.  Informed pt of EDD 07/14/16 according pregnancy wheel.  Pt stated that she wants to receive prenatal care from the Clinics.  Pt walked to front office to schedule NEW OB appt.  Pt stated that she did not have any other questions.

## 2015-12-08 ENCOUNTER — Encounter: Payer: Self-pay | Admitting: Family

## 2015-12-23 ENCOUNTER — Encounter: Payer: Self-pay | Admitting: Internal Medicine

## 2015-12-23 ENCOUNTER — Encounter: Payer: Self-pay | Admitting: Family

## 2016-01-07 ENCOUNTER — Ambulatory Visit: Payer: 59 | Admitting: Internal Medicine

## 2016-01-07 ENCOUNTER — Encounter: Payer: Self-pay | Admitting: Family

## 2016-01-07 ENCOUNTER — Ambulatory Visit (INDEPENDENT_AMBULATORY_CARE_PROVIDER_SITE_OTHER): Payer: 59 | Admitting: Family

## 2016-01-07 VITALS — BP 117/84 | HR 66 | Temp 98.8°F | Wt 259.5 lb

## 2016-01-07 DIAGNOSIS — E0590 Thyrotoxicosis, unspecified without thyrotoxic crisis or storm: Secondary | ICD-10-CM | POA: Diagnosis not present

## 2016-01-07 DIAGNOSIS — Z113 Encounter for screening for infections with a predominantly sexual mode of transmission: Secondary | ICD-10-CM | POA: Diagnosis not present

## 2016-01-07 DIAGNOSIS — O219 Vomiting of pregnancy, unspecified: Secondary | ICD-10-CM

## 2016-01-07 DIAGNOSIS — E039 Hypothyroidism, unspecified: Secondary | ICD-10-CM | POA: Insufficient documentation

## 2016-01-07 DIAGNOSIS — O0991 Supervision of high risk pregnancy, unspecified, first trimester: Secondary | ICD-10-CM

## 2016-01-07 DIAGNOSIS — O0990 Supervision of high risk pregnancy, unspecified, unspecified trimester: Secondary | ICD-10-CM | POA: Insufficient documentation

## 2016-01-07 DIAGNOSIS — O99281 Endocrine, nutritional and metabolic diseases complicating pregnancy, first trimester: Secondary | ICD-10-CM | POA: Diagnosis not present

## 2016-01-07 DIAGNOSIS — O99280 Endocrine, nutritional and metabolic diseases complicating pregnancy, unspecified trimester: Secondary | ICD-10-CM

## 2016-01-07 LAB — POCT URINALYSIS DIP (DEVICE)
GLUCOSE, UA: NEGATIVE mg/dL
Hgb urine dipstick: NEGATIVE
Ketones, ur: 40 mg/dL — AB
LEUKOCYTES UA: NEGATIVE
NITRITE: NEGATIVE
Protein, ur: NEGATIVE mg/dL
UROBILINOGEN UA: 0.2 mg/dL (ref 0.0–1.0)
pH: 6 (ref 5.0–8.0)

## 2016-01-07 MED ORDER — DOXYLAMINE-PYRIDOXINE 10-10 MG PO TBEC: DELAYED_RELEASE_TABLET | ORAL | Status: DC

## 2016-01-07 MED FILL — DICLEGIS DR 10-10 MG TABLET: 10-10 | 30 days supply | Qty: 100 | Fill #0

## 2016-01-07 NOTE — Patient Instructions (Signed)

## 2016-01-07 NOTE — Progress Notes (Signed)
Breastfeeding tip of the week reviewed 1 hr gtt/prenatal labs today

## 2016-01-08 ENCOUNTER — Encounter: Payer: Self-pay | Admitting: Family

## 2016-01-08 ENCOUNTER — Telehealth: Payer: Self-pay | Admitting: Family

## 2016-01-08 LAB — GC/CHLAMYDIA PROBE AMP (~~LOC~~) NOT AT ARMC
CHLAMYDIA, DNA PROBE: NEGATIVE
Neisseria Gonorrhea: NEGATIVE

## 2016-01-08 LAB — TSH: TSH: 5.89 mIU/L — ABNORMAL HIGH

## 2016-01-08 LAB — CULTURE, OB URINE
Colony Count: NO GROWTH
ORGANISM ID, BACTERIA: NO GROWTH

## 2016-01-08 LAB — GLUCOSE TOLERANCE, 1 HOUR (50G) W/O FASTING: Glucose, 1 Hr, gestational: 114 mg/dL (ref <140)

## 2016-01-08 NOTE — Telephone Encounter (Signed)
Pt called to notify regarding TSH, 5.89, unable to leave message (voicemail not set up).  Per initial visit pt plans to see endo and retake meds.  Stopped taking due to nausea.

## 2016-01-08 NOTE — Progress Notes (Signed)
Subjective:    Jacqueline Peterson is a G83P0 [redacted]w[redacted]d being seen today for her first obstetrical visit.  Her obstetrical history is significant for hypothyroidism newly diagnosed, HSV, and obesity. States has not taken synthroid consistently due to increased nausea in AM.  Vomits only in morning, approx 1-2x.  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  Patient does intend to breast feed. Pregnancy history fully reviewed.  Last pap smear in August 2016.    Patient reports heartburn, nausea, no bleeding, no cramping and vomiting.  Filed Vitals:   01/07/16 0851  BP: 117/84  Pulse: 66  Temp: 98.8 F (37.1 C)  Weight: 259 lb 8 oz (117.708 kg)    HISTORY: OB History  Gravida Para Term Preterm AB SAB TAB Ectopic Multiple Living  1             # Outcome Date GA Lbr Len/2nd Weight Sex Delivery Anes PTL Lv  1 Current              Past Medical History  Diagnosis Date  . History of chicken pox   . Hyperthyroidism    Past Surgical History  Procedure Laterality Date  . No past surgeries     Family History  Problem Relation Age of Onset  . Fibroids Mother   . Thyroid disease Mother     Hypo  . Basal cell carcinoma Mother   . Hypertension Mother   . Cancer Father 84    Non Hodgkins Adreanal carcinoma, Non small cell  . Hypertension Father   . Early death Father   . ADD / ADHD Sister   . Kidney disease Maternal Uncle   . Hypertension Maternal Uncle   . Anxiety disorder Maternal Uncle   . Diabetes Maternal Grandmother     Type 1  . Pancreatitis Maternal Grandmother   . Aneurysm Maternal Grandmother     brain  . Hypertension Maternal Grandfather   . Rheumatic fever Maternal Grandfather   . Hypertension Paternal Grandmother   . Stroke Paternal Grandmother   . Lymphoma Paternal Grandfather   . Hypertension Paternal Grandfather   . Diabetes Paternal Grandfather   . Rheum arthritis Other   . Cancer Other     60's Breast Ca     Exam   Filed Vitals:    01/07/16 0851  BP: 117/84  Pulse: 66  Temp: 98.8 F (37.1 C)    Skin: normal coloration and turgor, no rashes    Neurologic: negative   Extremities: normal strength, tone, and muscle mass   HEENT neck supple with midline trachea and thyroid without masses   Mouth/Teeth mucous membranes moist, pharynx normal without lesions   Neck supple and no masses   Cardiovascular: regular rate and rhythm, no murmurs or gallops   Respiratory:  appears well, vitals normal, no respiratory distress, acyanotic, normal RR, neck free of mass or lymphadenopathy, chest clear, no wheezing, crepitations, rhonchi, normal symmetric air entry     Assessment:    Pregnancy: G1P0 Patient Active Problem List   Diagnosis Date Noted  . Supervision of high risk pregnancy, antepartum 01/07/2016  . Hyperthyroidism in pregnancy, antepartum 01/07/2016  . Hypothyroidism 10/04/2015  . Esophageal reflux 10/25/2014  . Palpitations 08/28/2014  . Herpes 08/14/2013  . Routine general medical examination at a health care facility 07/10/2013  . Screen for STD (sexually transmitted disease) 12/23/2012  . Severe obesity (BMI >= 40) ([place]) 03/24/2012  . Anxiety 01/15/2012  . Insomnia  01/15/2012        Plan:     Initial labs drawn. RX Diclegis Prenatal vitamins. Problem list reviewed and updated. Genetic Screening discussed First Screen: ordered.  Follow up in 4 weeks.  Gwen Pounds 01/08/2016

## 2016-01-09 ENCOUNTER — Other Ambulatory Visit: Payer: Self-pay | Admitting: Family

## 2016-01-09 ENCOUNTER — Encounter (HOSPITAL_COMMUNITY): Payer: Self-pay

## 2016-01-09 ENCOUNTER — Ambulatory Visit (HOSPITAL_COMMUNITY)
Admission: RE | Admit: 2016-01-09 | Discharge: 2016-01-09 | Disposition: A | Discharge status: Home / Self Care | Payer: 59 | Source: Ambulatory Visit | Attending: Family | Admitting: Family

## 2016-01-09 DIAGNOSIS — B009 Herpesviral infection, unspecified: Secondary | ICD-10-CM | POA: Diagnosis not present

## 2016-01-09 DIAGNOSIS — O99211 Obesity complicating pregnancy, first trimester: Secondary | ICD-10-CM | POA: Insufficient documentation

## 2016-01-09 DIAGNOSIS — O281 Abnormal biochemical finding on antenatal screening of mother: Secondary | ICD-10-CM | POA: Diagnosis not present

## 2016-01-09 DIAGNOSIS — E039 Hypothyroidism, unspecified: Secondary | ICD-10-CM

## 2016-01-09 DIAGNOSIS — Z36 Encounter for antenatal screening of mother: Secondary | ICD-10-CM | POA: Diagnosis not present

## 2016-01-09 DIAGNOSIS — O98511 Other viral diseases complicating pregnancy, first trimester: Secondary | ICD-10-CM

## 2016-01-09 DIAGNOSIS — Z3A13 13 weeks gestation of pregnancy: Secondary | ICD-10-CM | POA: Diagnosis not present

## 2016-01-09 DIAGNOSIS — O0991 Supervision of high risk pregnancy, unspecified, first trimester: Secondary | ICD-10-CM

## 2016-01-09 DIAGNOSIS — O99281 Endocrine, nutritional and metabolic diseases complicating pregnancy, first trimester: Secondary | ICD-10-CM | POA: Diagnosis not present

## 2016-01-09 DIAGNOSIS — Z369 Encounter for antenatal screening, unspecified: Secondary | ICD-10-CM

## 2016-01-09 LAB — PRESCRIPTION MONITORING PROFILE (19 PANEL)
Amphetamine/Meth: NEGATIVE ng/mL
Barbiturate Screen, Urine: NEGATIVE ng/mL
Benzodiazepine Screen, Urine: NEGATIVE ng/mL
Buprenorphine, Urine: NEGATIVE ng/mL
CANNABINOID SCRN UR: NEGATIVE ng/mL
CARISOPRODOL, URINE: NEGATIVE ng/mL
COCAINE METABOLITES: NEGATIVE ng/mL
CREATININE, URINE: 250.75 mg/dL (ref >20.0)
Fentanyl, Ur: NEGATIVE ng/mL
MDMA URINE: NEGATIVE ng/mL
MEPERIDINE UR: NEGATIVE ng/mL
METHADONE SCREEN, URINE: NEGATIVE ng/mL
Methaqualone: NEGATIVE ng/mL
Nitrites, Initial: NEGATIVE ug/mL
OXYCODONE SCRN UR: NEGATIVE ng/mL
Opiate Screen, Urine: NEGATIVE ng/mL
PH URINE, INITIAL: 6.3 pH (ref 4.5–8.9)
PHENCYCLIDINE, UR: NEGATIVE ng/mL
Propoxyphene: NEGATIVE ng/mL
TAPENTADOLUR: NEGATIVE ng/mL
Tramadol Scrn, Ur: NEGATIVE ng/mL
Zolpidem, Urine: NEGATIVE ng/mL

## 2016-01-09 LAB — PRENATAL PROFILE (SOLSTAS)
ANTIBODY SCREEN: NEGATIVE
BASOS ABS: 0 10*3/uL (ref 0.0–0.1)
BASOS PCT: 0 % (ref 0–1)
EOS ABS: 0.1 10*3/uL (ref 0.0–0.7)
Eosinophils Relative: 1 % (ref 0–5)
HEMATOCRIT: 39 % (ref 36.0–46.0)
HEMOGLOBIN: 13.3 g/dL (ref 12.0–15.0)
HIV 1&2 Ab, 4th Generation: NONREACTIVE
Hepatitis B Surface Ag: NEGATIVE
LYMPHS ABS: 1.8 10*3/uL (ref 0.7–4.0)
Lymphocytes Relative: 23 % (ref 12–46)
MCH: 30.4 pg (ref 26.0–34.0)
MCHC: 34.1 g/dL (ref 30.0–36.0)
MCV: 89.2 fL (ref 78.0–100.0)
MPV: 10.6 fL (ref 8.6–12.4)
Monocytes Absolute: 0.2 10*3/uL (ref 0.1–1.0)
Monocytes Relative: 3 % (ref 3–12)
NEUTROS PCT: 73 % (ref 43–77)
Neutro Abs: 5.8 10*3/uL (ref 1.7–7.7)
Platelets: 274 10*3/uL (ref 150–400)
RBC: 4.37 MIL/uL (ref 3.87–5.11)
RDW: 13.3 % (ref 11.5–15.5)
Rh Type: POSITIVE
Rubella: 3.57 Index — ABNORMAL HIGH (ref <0.90)
WBC: 7.9 10*3/uL (ref 4.0–10.5)

## 2016-01-10 LAB — CYSTIC FIBROSIS DIAGNOSTIC STUDY

## 2016-01-17 ENCOUNTER — Encounter: Payer: Self-pay | Admitting: *Deleted

## 2016-01-17 DIAGNOSIS — O0990 Supervision of high risk pregnancy, unspecified, unspecified trimester: Secondary | ICD-10-CM

## 2016-01-20 ENCOUNTER — Other Ambulatory Visit (HOSPITAL_COMMUNITY): Payer: Self-pay

## 2016-02-11 ENCOUNTER — Ambulatory Visit (INDEPENDENT_AMBULATORY_CARE_PROVIDER_SITE_OTHER): Payer: 59 | Admitting: Family

## 2016-02-11 VITALS — BP 136/96 | HR 87 | Temp 99.0°F | Wt 262.8 lb

## 2016-02-11 DIAGNOSIS — K219 Gastro-esophageal reflux disease without esophagitis: Secondary | ICD-10-CM

## 2016-02-11 DIAGNOSIS — O99212 Obesity complicating pregnancy, second trimester: Secondary | ICD-10-CM

## 2016-02-11 DIAGNOSIS — O98312 Other infections with a predominantly sexual mode of transmission complicating pregnancy, second trimester: Secondary | ICD-10-CM

## 2016-02-11 DIAGNOSIS — E039 Hypothyroidism, unspecified: Secondary | ICD-10-CM

## 2016-02-11 DIAGNOSIS — A6009 Herpesviral infection of other urogenital tract: Secondary | ICD-10-CM

## 2016-02-11 DIAGNOSIS — F419 Anxiety disorder, unspecified: Secondary | ICD-10-CM

## 2016-02-11 DIAGNOSIS — O169 Unspecified maternal hypertension, unspecified trimester: Secondary | ICD-10-CM

## 2016-02-11 DIAGNOSIS — O99342 Other mental disorders complicating pregnancy, second trimester: Secondary | ICD-10-CM

## 2016-02-11 DIAGNOSIS — Z1389 Encounter for screening for other disorder: Secondary | ICD-10-CM

## 2016-02-11 DIAGNOSIS — O0992 Supervision of high risk pregnancy, unspecified, second trimester: Secondary | ICD-10-CM

## 2016-02-11 DIAGNOSIS — O99282 Endocrine, nutritional and metabolic diseases complicating pregnancy, second trimester: Secondary | ICD-10-CM

## 2016-02-11 LAB — POCT URINALYSIS DIP (DEVICE)
Bilirubin Urine: NEGATIVE
Glucose, UA: NEGATIVE mg/dL
KETONES UR: NEGATIVE mg/dL
LEUKOCYTES UA: NEGATIVE
Nitrite: NEGATIVE
Protein, ur: NEGATIVE mg/dL
SPECIFIC GRAVITY, URINE: 1.025 (ref 1.005–1.030)
UROBILINOGEN UA: 0.2 mg/dL (ref 0.0–1.0)
pH: 7 (ref 5.0–8.0)

## 2016-02-11 MED ORDER — LEVOTHYROXINE SODIUM 75 MCG PO TABS: ORAL_TABLET | ORAL | Status: DC

## 2016-02-11 MED ORDER — PANTOPRAZOLE SODIUM 40 MG PO TBEC: 40.0000 mg | DELAYED_RELEASE_TABLET | Freq: Two times a day (BID) | ORAL | Status: DC

## 2016-02-11 MED FILL — PANTOPRAZOLE SOD DR 40 MG T: 40 | 90 days supply | Qty: 180 | Fill #0

## 2016-02-11 MED FILL — LEVOTHYROXINE 75 MCG TABLET: 75 | 90 days supply | Qty: 90 | Fill #0

## 2016-02-11 NOTE — Progress Notes (Signed)
Subjective:  Jacqueline Peterson is a 30 y.o. G1P0 at [redacted]w[redacted]d being seen today for ongoing prenatal care.  She is currently monitored for the following issues for this high-risk pregnancy and has Anxiety; Insomnia; Severe obesity (BMI >= 40) ([place]); Screen for STD (sexually transmitted disease); Routine general medical examination at a health care facility; Herpes; Palpitations; Esophageal reflux; Hypothyroidism; Supervision of high risk pregnancy, antepartum; Hypothyroidism affecting pregnancy; and Elevated blood pressure affecting pregnancy, antepartum on her problem list.  Patient reports no complaints.  Improved heartburn.   Contractions: Not present. Vag. Bleeding: None.  Movement: Present. Denies leaking of fluid.   The following portions of the patient's history were reviewed and updated as appropriate: allergies, current medications, past family history, past medical history, past social history, past surgical history and problem list. Problem list updated.  Objective:   Filed Vitals:   02/11/16 1016  BP: 136/96  Pulse: 87  Temp: 99 F (37.2 C)  Weight: 262 lb 12.8 oz (119.205 kg)    Fetal Status: Fetal Heart Rate (bpm): 145 Fundal Height: 19 cm Movement: Present     General:  Alert, oriented and cooperative. Patient is in no acute distress.  Skin: Skin is warm and dry. No rash noted.   Cardiovascular: Normal heart rate noted  Respiratory: Normal respiratory effort, no problems with respiration noted  Abdomen: Soft, gravid, appropriate for gestational age. Pain/Pressure: Absent     Pelvic: Vag. Bleeding: None     Cervical exam deferred        Extremities: Normal range of motion.  Edema: None  Mental Status: Normal mood and affect. Normal behavior. Normal judgment and thought content.   Urinalysis: Urine Protein: Negative Urine Glucose: Negative  Assessment and Plan:  Pregnancy: G1P0 at [redacted]w[redacted]d  1. Supervision of high risk pregnancy, antepartum, second trimester - [place] MFM OB  DETAIL +14 WK; Future - Transfer to [place] office (closer)  2. . Gastroesophageal reflux disease, esophagitis presence not specified - pantoprazole (PROTONIX) 40 MG tablet; Take 1 tablet (40 mg total) by mouth 2 (two) times daily.  Dispense: 180 tablet; Refill: 4  3. Elevated blood pressure affecting pregnancy, antepartum - Explained will need to closely watch blood pressure  4.  Hypothyroidism - Refill synthroid - Check TSH at next visit  General obstetric precautions including but not limited to vaginal bleeding and pelvic pain reviewed in detail with the patient. Please refer to After Visit Summary for other counseling recommendations.  Return in about 3 weeks (around 03/03/2016).   Venia Carbon Michiel Cowboy, CNM

## 2016-02-18 ENCOUNTER — Encounter (HOSPITAL_COMMUNITY): Payer: Self-pay

## 2016-02-18 ENCOUNTER — Other Ambulatory Visit (HOSPITAL_COMMUNITY): Payer: Self-pay | Admitting: *Deleted

## 2016-02-18 ENCOUNTER — Ambulatory Visit (HOSPITAL_COMMUNITY)
Admission: RE | Admit: 2016-02-18 | Discharge: 2016-02-18 | Disposition: A | Discharge status: Home / Self Care | Payer: 59 | Source: Ambulatory Visit | Attending: Family | Admitting: Family

## 2016-02-18 VITALS — BP 123/71 | HR 111 | Wt 265.0 lb

## 2016-02-18 DIAGNOSIS — O99282 Endocrine, nutritional and metabolic diseases complicating pregnancy, second trimester: Secondary | ICD-10-CM | POA: Diagnosis not present

## 2016-02-18 DIAGNOSIS — O0992 Supervision of high risk pregnancy, unspecified, second trimester: Secondary | ICD-10-CM

## 2016-02-18 DIAGNOSIS — E039 Hypothyroidism, unspecified: Secondary | ICD-10-CM

## 2016-02-18 DIAGNOSIS — Z3A18 18 weeks gestation of pregnancy: Secondary | ICD-10-CM | POA: Diagnosis not present

## 2016-02-18 DIAGNOSIS — Z36 Encounter for antenatal screening of mother: Secondary | ICD-10-CM | POA: Insufficient documentation

## 2016-02-18 DIAGNOSIS — O0990 Supervision of high risk pregnancy, unspecified, unspecified trimester: Secondary | ICD-10-CM

## 2016-02-18 DIAGNOSIS — O169 Unspecified maternal hypertension, unspecified trimester: Secondary | ICD-10-CM

## 2016-02-18 DIAGNOSIS — O99212 Obesity complicating pregnancy, second trimester: Secondary | ICD-10-CM | POA: Diagnosis not present

## 2016-02-18 DIAGNOSIS — O139 Gestational [pregnancy-induced] hypertension without significant proteinuria, unspecified trimester: Secondary | ICD-10-CM | POA: Diagnosis not present

## 2016-02-18 DIAGNOSIS — Z1389 Encounter for screening for other disorder: Secondary | ICD-10-CM

## 2016-02-18 DIAGNOSIS — O99280 Endocrine, nutritional and metabolic diseases complicating pregnancy, unspecified trimester: Principal | ICD-10-CM

## 2016-02-19 NOTE — Addendum Note (Signed)
Encounter addended by: Gwen Pounds, CNM on: 02/19/2016  4:11 PM<BR>     Documentation filed: Problem List

## 2016-03-03 ENCOUNTER — Ambulatory Visit (INDEPENDENT_AMBULATORY_CARE_PROVIDER_SITE_OTHER): Payer: 59 | Admitting: Obstetrics & Gynecology

## 2016-03-03 VITALS — BP 132/82 | HR 88 | Wt 263.0 lb

## 2016-03-03 DIAGNOSIS — O0990 Supervision of high risk pregnancy, unspecified, unspecified trimester: Secondary | ICD-10-CM

## 2016-03-03 DIAGNOSIS — O139 Gestational [pregnancy-induced] hypertension without significant proteinuria, unspecified trimester: Secondary | ICD-10-CM

## 2016-03-03 DIAGNOSIS — O99280 Endocrine, nutritional and metabolic diseases complicating pregnancy, unspecified trimester: Secondary | ICD-10-CM

## 2016-03-03 DIAGNOSIS — E039 Hypothyroidism, unspecified: Secondary | ICD-10-CM

## 2016-03-03 DIAGNOSIS — B009 Herpesviral infection, unspecified: Secondary | ICD-10-CM

## 2016-03-03 DIAGNOSIS — O169 Unspecified maternal hypertension, unspecified trimester: Secondary | ICD-10-CM

## 2016-03-03 NOTE — Progress Notes (Signed)
Subjective:  Jacqueline Peterson is a 30 y.o. MW G1P0 at [redacted]w[redacted]d being seen today for ongoing prenatal care.  She is currently monitored for the following issues for this low-risk pregnancy and has Anxiety; Insomnia; Severe obesity (BMI >= 40) ([place]); Screen for STD (sexually transmitted disease); Routine general medical examination at a health care facility; Herpes; Palpitations; Esophageal reflux; Hypothyroidism; Supervision of high risk pregnancy, antepartum; Hypothyroidism affecting pregnancy; and Elevated blood pressure affecting pregnancy, antepartum on her problem list.  Patient reports no complaints.  Contractions: Not present. Vag. Bleeding: None.  Movement: Present. Denies leaking of fluid.   The following portions of the patient's history were reviewed and updated as appropriate: allergies, current medications, past family history, past medical history, past social history, past surgical history and problem list. Problem list updated.  Objective:   Filed Vitals:   03/03/16 1348  BP: 132/82  Pulse: 88  Weight: 263 lb (119.296 kg)    Fetal Status: Fetal Heart Rate (bpm): 140 Fundal Height: 20 cm Movement: Present     General:  Alert, oriented and cooperative. Patient is in no acute distress.  Skin: Skin is warm and dry. No rash noted.   Cardiovascular: Normal heart rate noted  Respiratory: Normal respiratory effort, no problems with respiration noted  Abdomen: Soft, gravid, appropriate for gestational age. Pain/Pressure: Absent     Pelvic: Vag. Bleeding: None Vag D/C Character: Thin   Cervical exam deferred        Extremities: Normal range of motion.  Edema: None  Mental Status: Normal mood and affect. Normal behavior. Normal judgment and thought content.   Urinalysis: Urine Protein: Negative Urine Glucose: Negative  Assessment and Plan:  Pregnancy: G1P0 at [redacted]w[redacted]d  1. Elevated blood pressure affecting pregnancy, antepartum   2. Herpes - start valtrex at [redacted] weeks EGA or prn  sooner  3. Hypothyroidism affecting pregnancy endo  4. Morbid obesity due to excess calories ([place])   5. Supervision of high risk pregnancy, antepartum, unspecified trimester   Preterm labor symptoms and general obstetric precautions including but not limited to vaginal bleeding, contractions, leaking of fluid and fetal movement were reviewed in detail with the patient. Please refer to After Visit Summary for other counseling recommendations.  Return in about 4 weeks (around 03/31/2016).   Emily Filbert, MD

## 2016-03-17 ENCOUNTER — Other Ambulatory Visit (HOSPITAL_COMMUNITY): Payer: Self-pay | Admitting: Obstetrics and Gynecology

## 2016-03-17 ENCOUNTER — Ambulatory Visit (HOSPITAL_COMMUNITY)
Admission: RE | Admit: 2016-03-17 | Discharge: 2016-03-17 | Disposition: A | Discharge status: Home / Self Care | Payer: 59 | Source: Ambulatory Visit | Attending: Family | Admitting: Family

## 2016-03-17 ENCOUNTER — Encounter (HOSPITAL_COMMUNITY): Payer: Self-pay

## 2016-03-17 VITALS — BP 129/58 | HR 83 | Wt 268.4 lb

## 2016-03-17 DIAGNOSIS — O99282 Endocrine, nutritional and metabolic diseases complicating pregnancy, second trimester: Secondary | ICD-10-CM | POA: Insufficient documentation

## 2016-03-17 DIAGNOSIS — O99280 Endocrine, nutritional and metabolic diseases complicating pregnancy, unspecified trimester: Secondary | ICD-10-CM

## 2016-03-17 DIAGNOSIS — Z0489 Encounter for examination and observation for other specified reasons: Secondary | ICD-10-CM

## 2016-03-17 DIAGNOSIS — O0990 Supervision of high risk pregnancy, unspecified, unspecified trimester: Secondary | ICD-10-CM

## 2016-03-17 DIAGNOSIS — O99212 Obesity complicating pregnancy, second trimester: Secondary | ICD-10-CM | POA: Insufficient documentation

## 2016-03-17 DIAGNOSIS — Z3A22 22 weeks gestation of pregnancy: Secondary | ICD-10-CM | POA: Diagnosis not present

## 2016-03-17 DIAGNOSIS — IMO0002 Reserved for concepts with insufficient information to code with codable children: Secondary | ICD-10-CM

## 2016-03-17 DIAGNOSIS — O169 Unspecified maternal hypertension, unspecified trimester: Secondary | ICD-10-CM

## 2016-03-17 DIAGNOSIS — E039 Hypothyroidism, unspecified: Secondary | ICD-10-CM | POA: Diagnosis not present

## 2016-03-17 DIAGNOSIS — Z36 Encounter for antenatal screening of mother: Secondary | ICD-10-CM | POA: Diagnosis not present

## 2016-03-31 ENCOUNTER — Ambulatory Visit (INDEPENDENT_AMBULATORY_CARE_PROVIDER_SITE_OTHER): Payer: 59 | Admitting: Obstetrics & Gynecology

## 2016-03-31 VITALS — BP 134/78 | HR 80 | Wt 274.0 lb

## 2016-03-31 DIAGNOSIS — O0992 Supervision of high risk pregnancy, unspecified, second trimester: Secondary | ICD-10-CM

## 2016-04-15 NOTE — Progress Notes (Signed)
Subjective:  Jacqueline Peterson is a 30 y.o. G1P0 at [redacted]w[redacted]d being seen today for ongoing prenatal care.  She is currently monitored for the following issues for this high-risk pregnancy and has Anxiety; Insomnia; Severe obesity (BMI >= 40) ([place]); Screen for STD (sexually transmitted disease); Routine general medical examination at a health care facility; Herpes; Palpitations; Esophageal reflux; Hypothyroidism; Supervision of high risk pregnancy, antepartum; Hypothyroidism affecting pregnancy; and Elevated blood pressure affecting pregnancy, antepartum on her problem list.  Patient reports no complaints.  Contractions: Not present. Vag. Bleeding: None.  Movement: Present. Denies leaking of fluid.   The following portions of the patient's history were reviewed and updated as appropriate: allergies, current medications, past family history, past medical history, past social history, past surgical history and problem list. Problem list updated.  Objective:   Filed Vitals:   03/31/16 1551  BP: 134/78  Pulse: 80  Weight: 274 lb (124.286 kg)    Fetal Status:     Movement: Present     General:  Alert, oriented and cooperative. Patient is in no acute distress.  Skin: Skin is warm and dry. No rash noted.   Cardiovascular: Normal heart rate noted  Respiratory: Normal respiratory effort, no problems with respiration noted  Abdomen: Soft, gravid, appropriate for gestational age. Pain/Pressure: Absent     Pelvic: Vag. Bleeding: None Vag D/C Character: Thin   Cervical exam deferred        Extremities: Normal range of motion.  Edema: None  Mental Status: Normal mood and affect. Normal behavior. Normal judgment and thought content.   Urinalysis: Urine Protein: Negative Urine Glucose: Negative  Assessment and Plan:  Pregnancy: G1P0 at [redacted]w[redacted]d  1. Supervision of high risk pregnancy, antepartum, second trimester   2. Morbid obesity due to excess calories (HCC)   Preterm labor symptoms and general  obstetric precautions including but not limited to vaginal bleeding, contractions, leaking of fluid and fetal movement were reviewed in detail with the patient. Please refer to After Visit Summary for other counseling recommendations.  Return in about 3 weeks (around 04/21/2016) for glucola.   Emily Filbert, MD

## 2016-04-20 ENCOUNTER — Ambulatory Visit (INDEPENDENT_AMBULATORY_CARE_PROVIDER_SITE_OTHER): Payer: 59 | Admitting: Advanced Practice Midwife

## 2016-04-20 VITALS — BP 102/80 | HR 87 | Wt 274.0 lb

## 2016-04-20 DIAGNOSIS — Z3480 Encounter for supervision of other normal pregnancy, unspecified trimester: Secondary | ICD-10-CM | POA: Diagnosis not present

## 2016-04-20 DIAGNOSIS — Z3492 Encounter for supervision of normal pregnancy, unspecified, second trimester: Secondary | ICD-10-CM | POA: Diagnosis not present

## 2016-04-20 DIAGNOSIS — O0992 Supervision of high risk pregnancy, unspecified, second trimester: Secondary | ICD-10-CM

## 2016-04-20 DIAGNOSIS — Z23 Encounter for immunization: Secondary | ICD-10-CM | POA: Diagnosis not present

## 2016-04-20 DIAGNOSIS — Z36 Encounter for antenatal screening of mother: Secondary | ICD-10-CM

## 2016-04-20 DIAGNOSIS — Z3402 Encounter for supervision of normal first pregnancy, second trimester: Secondary | ICD-10-CM

## 2016-04-20 LAB — CBC
HEMATOCRIT: 33.4 % — AB (ref 35.0–45.0)
HEMOGLOBIN: 10.9 g/dL — AB (ref 11.7–15.5)
MCH: 29.9 pg (ref 27.0–33.0)
MCHC: 32.6 g/dL (ref 32.0–36.0)
MCV: 91.5 fL (ref 80.0–100.0)
MPV: 10.3 fL (ref 7.5–12.5)
Platelets: 268 10*3/uL (ref 140–400)
RBC: 3.65 MIL/uL — ABNORMAL LOW (ref 3.80–5.10)
RDW: 13.5 % (ref 11.0–15.0)
WBC: 10.7 10*3/uL (ref 3.8–10.8)

## 2016-04-20 LAB — GLUCOSE TOLERANCE, 1 HOUR (50G) W/O FASTING: GLUCOSE, 1 HR, GESTATIONAL: 151 mg/dL — AB (ref <140)

## 2016-04-20 NOTE — Progress Notes (Signed)
Subjective:  Jacqueline Peterson is a 30 y.o. G1P0 at [redacted]w[redacted]d being seen today for ongoing prenatal care.  She is currently monitored for the following issues for this high-risk pregnancy and has Anxiety; Insomnia; Severe obesity (BMI >= 40) ([place]); Screen for STD (sexually transmitted disease); Routine general medical examination at a health care facility; Herpes; Palpitations; Esophageal reflux; Hypothyroidism; Supervision of high risk pregnancy, antepartum; Hypothyroidism affecting pregnancy; and Elevated blood pressure affecting pregnancy, antepartum on her problem list.  Patient reports no complaints.  Contractions: Not present. Vag. Bleeding: None.  Movement: Present. Denies leaking of fluid.   The following portions of the patient's history were reviewed and updated as appropriate: allergies, current medications, past family history, past medical history, past social history, past surgical history and problem list. Problem list updated.  Objective:   Filed Vitals:   04/20/16 0929  BP: 102/80  Pulse: 87  Weight: 274 lb (124.286 kg)    Fetal Status: Fetal Heart Rate (bpm): 144   Movement: Present     General:  Alert, oriented and cooperative. Patient is in no acute distress.  Skin: Skin is warm and dry. No rash noted.   Cardiovascular: Normal heart rate noted  Respiratory: Normal respiratory effort, no problems with respiration noted  Abdomen: Soft, gravid, appropriate for gestational age. Pain/Pressure: Absent     Pelvic: Vag. Bleeding: None Vag D/C Character: Thin   Cervical exam deferred        Extremities: Normal range of motion.  Edema: Trace  Mental Status: Normal mood and affect. Normal behavior. Normal judgment and thought content.   Urinalysis: Urine Protein: Trace Urine Glucose: Negative  Assessment and Plan:  Pregnancy: G1P0 at [redacted]w[redacted]d  1. Normal pregnancy, second trimester  - Glucose Tolerance, 1 HR (50g) - CBC - HIV antibody (with reflex) - RPR - Tdap vaccine  greater than or equal to 7yo IM - Thyroid Panel With TSH  2. Supervision of high risk pregnancy, antepartum, second trimester   Preterm labor symptoms and general obstetric precautions including but not limited to vaginal bleeding, contractions, leaking of fluid and fetal movement were reviewed in detail with the patient. Please refer to After Visit Summary for other counseling recommendations.  F/U 2 weeks   Manya Silvas, [place]

## 2016-04-20 NOTE — Patient Instructions (Addendum)
Dental Work and Pregnancy Proper dental care before, during, and after pregnancy is important for you and your baby. Pregnancy hormones can sometimes cause the gums to swell, which makes it easier for food to become trapped between teeth. The health of your teeth and gums can affect your growing baby.  DENTAL CARE RECOMMENDATIONS To help prevent infection and maintain healthy teeth and gums, a thorough oral examination is recommended for all women in the first trimester of pregnancy. Routine cleanings and examinations are recommended throughout pregnancy.  DENTAL CARE CONSIDERATIONS  Tell your dentist if you are pregnant or want to become pregnant.   If you are pregnant, routine X-ray exams should be avoided until after your baby is born. However, they do not need to be avoided if you are trying to become pregnant.   If you need an emergency procedure that includes a dental X-ray exam during pregnancy, very low levels of radiation will be emitted from the X-ray machines. Lead aprons can be used for protection of the chest, abdomen, and thyroid.  Your dentist will help you weigh the risks and benefits of dental procedures during pregnancy. If possible, it is best to have dental procedures (such as cavity fillings and crown repair) during the second trimester of pregnancy or after the baby is born.   If you and your dentist decide to postpone a procedure for any reason, your dentist can suggest treatment to reduce the chance of infection until the procedure is performed.  HOME CARE INSTRUCTIONS   Follow good oral hygiene habits at home.  Brush your teeth twice daily with fluoride toothpaste. Brush thoroughly for at least 2 minutes. Avoid strong flavored toothpastes if you have morning sickness.   Floss at least once daily.   Eat a well-balanced diet low in sugar and carbohydrates.   See your dentist for normal interval oral examinations and cleanings.   If you vomit, rinse your  mouth with water afterward.   Make a dental appointment if you experience oral problems.   Follow up with your dentist as directed.  SEEK DENTAL CARE IF:  Oral symptoms such as pain, bleeding, swelling, or inflammation develop or worsen.   You develop growths or swelling in between teeth.   You develop signs of infection, such as:   A fever of more than 100.5 F (38.1 C).   Chills.   This information is not intended to replace advice given to you by your health care provider. Make sure you discuss any questions you have with your health care provider.   Document Released: 04/22/2010 Document Revised: 08/23/2013 Document Reviewed: 04/05/2013 Elsevier Interactive Patient Education 2016 [employer].  Tdap Vaccine (Tetanus, Diphtheria and Pertussis): What You Need to Know 1. Why get vaccinated? Tetanus, diphtheria and pertussis are very serious diseases. Tdap vaccine can protect [place] from these diseases. And, Tdap vaccine given to pregnant women can protect newborn babies against pertussis. TETANUS (Lockjaw) is rare in the [place] today. It causes painful muscle tightening and stiffness, usually all over the body.  It can lead to tightening of muscles in the head and neck so you can't open your mouth, swallow, or sometimes even breathe. Tetanus kills about 1 out of 10 people who are infected even after receiving the best medical care. DIPHTHERIA is also rare in the [place] today. It can cause a thick coating to form in the back of the throat.  It can lead to breathing problems, heart failure, paralysis, and death. PERTUSSIS (Whooping Cough)  causes severe coughing spells, which can cause difficulty breathing, vomiting and disturbed sleep.  It can also lead to weight loss, incontinence, and rib fractures. Up to 2 in 100 adolescents and 5 in 100 adults with pertussis are hospitalized or have complications, which could include pneumonia or death. These diseases are  caused by bacteria. Diphtheria and pertussis are spread from person to person through secretions from coughing or sneezing. Tetanus enters the body through cuts, scratches, or wounds. Before vaccines, as many as 200,000 cases of diphtheria, 200,000 cases of pertussis, and hundreds of cases of tetanus, were reported in the [place] each year. Since vaccination began, reports of cases for tetanus and diphtheria have dropped by about 99% and for pertussis by about 80%. 2. Tdap vaccine Tdap vaccine can protect adolescents and adults from tetanus, diphtheria, and pertussis. One dose of Tdap is routinely given at age 58 or 69. People who did not get Tdap at that age should get it as soon as possible. Tdap is especially important for healthcare professionals and anyone having close contact with a baby younger than 12 months. Pregnant women should get a dose of Tdap during every pregnancy, to protect the newborn from pertussis. Infants are most at risk for severe, life-threatening complications from pertussis. Another vaccine, called Td, protects against tetanus and diphtheria, but not pertussis. A Td booster should be given every 10 years. Tdap may be given as one of these boosters if you have never gotten Tdap before. Tdap may also be given after a severe cut or burn to prevent tetanus infection. Your doctor or the person giving you the vaccine can give you more information. Tdap may safely be given at the same time as other vaccines. 3. Some people should not get this vaccine  A person who has ever had a life-threatening allergic reaction after a previous dose of any diphtheria, tetanus or pertussis containing vaccine, OR has a severe allergy to any part of this vaccine, should not get Tdap vaccine. Tell the person giving the vaccine about any severe allergies.  Anyone who had coma or long repeated seizures within 7 days after a childhood dose of DTP or DTaP, or a previous dose of Tdap, should not get  Tdap, unless a cause other than the vaccine was found. They can still get Td.  Talk to your doctor if you:  have seizures or another nervous system problem,  had severe pain or swelling after any vaccine containing diphtheria, tetanus or pertussis,  ever had a condition called Guillain-Barr Syndrome (GBS),  aren't feeling well on the day the shot is scheduled. 4. Risks With any medicine, including vaccines, there is a chance of side effects. These are usually mild and go away on their own. Serious reactions are also possible but are rare. Most people who get Tdap vaccine do not have any problems with it. Mild problems following Tdap (Did not interfere with activities)  Pain where the shot was given (about 3 in 4 adolescents or 2 in 3 adults)  Redness or swelling where the shot was given (about 1 person in 5)  Mild fever of at least 100.20F (up to about 1 in 25 adolescents or 1 in 100 adults)  Headache (about 3 or 4 people in 10)  Tiredness (about 1 person in 3 or 4)  Nausea, vomiting, diarrhea, stomach ache (up to 1 in 4 adolescents or 1 in 10 adults)  Chills, sore joints (about 1 person in 10)  Body aches (about 1  person in 3 or 4)  Rash, swollen glands (uncommon) Moderate problems following Tdap (Interfered with activities, but did not require medical attention)  Pain where the shot was given (up to 1 in 5 or 6)  Redness or swelling where the shot was given (up to about 1 in 16 adolescents or 1 in 12 adults)  Fever over 102F (about 1 in 100 adolescents or 1 in 250 adults)  Headache (about 1 in 7 adolescents or 1 in 10 adults)  Nausea, vomiting, diarrhea, stomach ache (up to 1 or 3 people in 100)  Swelling of the entire arm where the shot was given (up to about 1 in 500). Severe problems following Tdap (Unable to perform usual activities; required medical attention)  Swelling, severe pain, bleeding and redness in the arm where the shot was given (rare). Problems  that could happen after any vaccine:  People sometimes faint after a medical procedure, including vaccination. Sitting or lying down for about 15 minutes can help prevent fainting, and injuries caused by a fall. Tell your doctor if you feel dizzy, or have vision changes or ringing in the ears.  Some people get severe pain in the shoulder and have difficulty moving the arm where a shot was given. This happens very rarely.  Any medication can cause a severe allergic reaction. Such reactions from a vaccine are very rare, estimated at fewer than 1 in a million doses, and would happen within a few minutes to a few hours after the vaccination. As with any medicine, there is a very remote chance of a vaccine causing a serious injury or death. The safety of vaccines is always being monitored. For more information, visit: http://www.aguilar.org/ 5. What if there is a serious problem? What should I look for?  Look for anything that concerns you, such as signs of a severe allergic reaction, very high fever, or unusual behavior.  Signs of a severe allergic reaction can include hives, swelling of the face and throat, difficulty breathing, a fast heartbeat, dizziness, and weakness. These would usually start a few minutes to a few hours after the vaccination. What should I do?  If you think it is a severe allergic reaction or other emergency that can't wait, call 9-1-1 or get the person to the nearest hospital. Otherwise, call your doctor.  Afterward, the reaction should be reported to the Vaccine Adverse Event Reporting System (VAERS). Your doctor might file this report, or you can do it yourself through the VAERS web site at www.vaers.SamedayNews.es, or by calling (386)226-5593. VAERS does not give medical advice.  6. The National Vaccine Injury Compensation Program The [employer] Vaccine Injury Compensation Program (VICP) is a federal program that was created to compensate people who may have been injured by  certain vaccines. Persons who believe they may have been injured by a vaccine can learn about the program and about filing a claim by calling 951-485-3789 or visiting the [employer] website at GoldCloset.com.ee. There is a time limit to file a claim for compensation. 7. How can I learn more?  Ask your doctor. He or she can give you the vaccine package insert or suggest other sources of information.  Call your local or state health department.  Contact the Centers for Disease Control and Prevention (CDC):  Call 604-219-2033 (1-800-CDC-INFO) or  Visit CDC's website at http://hunter.com/ CDC Tdap Vaccine VIS (01/09/14)   This information is not intended to replace advice given to you by your health care provider. Make sure you discuss any  questions you have with your health care provider.   Document Released: 05/03/2012 Document Revised: 11/23/2014 Document Reviewed: 02/14/2014 Elsevier Interactive Patient Education [employer].

## 2016-04-21 ENCOUNTER — Telehealth: Payer: Self-pay | Admitting: *Deleted

## 2016-04-21 DIAGNOSIS — R7302 Impaired glucose tolerance (oral): Secondary | ICD-10-CM | POA: Diagnosis not present

## 2016-04-21 DIAGNOSIS — R7309 Other abnormal glucose: Secondary | ICD-10-CM

## 2016-04-21 LAB — THYROID PANEL WITH TSH
FREE THYROXINE INDEX: 3.7 (ref 1.4–3.8)
T3 Uptake: 47 % — ABNORMAL HIGH (ref 22–35)
T4, Total: 7.8 ug/dL (ref 4.5–12.0)
TSH: 2.42 m[IU]/L

## 2016-04-21 LAB — HIV ANTIBODY (ROUTINE TESTING W REFLEX): HIV 1&2 Ab, 4th Generation: NONREACTIVE

## 2016-04-21 LAB — RPR

## 2016-04-21 NOTE — Telephone Encounter (Signed)
Pt notified of abnormal 1 hr GTT and she is scheduled for a fasting 3 hr GTT

## 2016-04-23 ENCOUNTER — Telehealth: Payer: Self-pay | Admitting: *Deleted

## 2016-04-23 DIAGNOSIS — O0992 Supervision of high risk pregnancy, unspecified, second trimester: Secondary | ICD-10-CM

## 2016-04-23 LAB — GLUCOSE TOLERANCE, 3 HOURS
Glucose Tolerance, 1 hour: 150 mg/dL (ref <190)
Glucose Tolerance, 2 hour: 134 mg/dL (ref <165)
Glucose Tolerance, Fasting: 83 mg/dL (ref 65–104)
Glucose, GTT - 3 Hour: 111 mg/dL (ref <145)

## 2016-04-23 NOTE — Telephone Encounter (Signed)
Pt notified of normal 3 hr GTT

## 2016-05-05 ENCOUNTER — Ambulatory Visit (INDEPENDENT_AMBULATORY_CARE_PROVIDER_SITE_OTHER): Payer: 59 | Admitting: Obstetrics & Gynecology

## 2016-05-05 VITALS — BP 126/78 | HR 88 | Wt 278.0 lb

## 2016-05-05 DIAGNOSIS — O99280 Endocrine, nutritional and metabolic diseases complicating pregnancy, unspecified trimester: Secondary | ICD-10-CM

## 2016-05-05 DIAGNOSIS — E039 Hypothyroidism, unspecified: Secondary | ICD-10-CM

## 2016-05-05 DIAGNOSIS — O0993 Supervision of high risk pregnancy, unspecified, third trimester: Secondary | ICD-10-CM

## 2016-05-05 NOTE — Progress Notes (Signed)
Subjective:  Jacqueline Peterson is a 30 y.o. MW G1P0 (daughter Lincoln Maxin) at [redacted]w[redacted]d being seen today for ongoing prenatal care.  She is currently monitored for the following issues for this high-risk pregnancy and has Anxiety; Insomnia; Severe obesity (BMI >= 40) ([place]); Screen for STD (sexually transmitted disease); Routine general medical examination at a health care facility; Herpes; Palpitations; Esophageal reflux; Hypothyroidism; Supervision of high risk pregnancy, antepartum; Hypothyroidism affecting pregnancy; and Elevated blood pressure affecting pregnancy, antepartum on her problem list.  Patient reports no complaints.  Contractions: Not present. Vag. Bleeding: None.  Movement: Present. Denies leaking of fluid.   The following portions of the patient's history were reviewed and updated as appropriate: allergies, current medications, past family history, past medical history, past social history, past surgical history and problem list. Problem list updated.  Objective:   Filed Vitals:   05/05/16 1439  BP: 126/78  Pulse: 88  Weight: 278 lb (126.1 kg)    Fetal Status:   Fundal Height: 32 cm Movement: Present     General:  Alert, oriented and cooperative. Patient is in no acute distress.  Skin: Skin is warm and dry. No rash noted.   Cardiovascular: Normal heart rate noted  Respiratory: Normal respiratory effort, no problems with respiration noted  Abdomen: Soft, gravid, appropriate for gestational age. Pain/Pressure: Absent     Pelvic: Cervical exam deferred        Extremities: Normal range of motion.  Edema: Trace  Mental Status: Normal mood and affect. Normal behavior. Normal judgment and thought content.   Urinalysis: Urine Protein: Trace Urine Glucose: Negative  Assessment and Plan:  Pregnancy: G1P0 at [redacted]w[redacted]d  1. Hypothyroidism affecting pregnancy   2. Supervision of high risk pregnancy, antepartum, third trimester   3. Morbid obesity due to excess calories ([place]) -Rec'd be  careful with weight gain  Preterm labor symptoms and general obstetric precautions including but not limited to vaginal bleeding, contractions, leaking of fluid and fetal movement were reviewed in detail with the patient. Please refer to After Visit Summary for other counseling recommendations.  Return in about 2 weeks (around 05/19/2016).   Emily Filbert, MD

## 2016-05-21 ENCOUNTER — Ambulatory Visit (INDEPENDENT_AMBULATORY_CARE_PROVIDER_SITE_OTHER): Payer: 59 | Admitting: Obstetrics & Gynecology

## 2016-05-21 ENCOUNTER — Encounter: Payer: Self-pay | Admitting: Obstetrics & Gynecology

## 2016-05-21 VITALS — BP 130/78 | HR 90 | Wt 281.0 lb

## 2016-05-21 DIAGNOSIS — O0993 Supervision of high risk pregnancy, unspecified, third trimester: Secondary | ICD-10-CM

## 2016-05-21 DIAGNOSIS — E039 Hypothyroidism, unspecified: Secondary | ICD-10-CM

## 2016-05-21 DIAGNOSIS — O169 Unspecified maternal hypertension, unspecified trimester: Secondary | ICD-10-CM

## 2016-05-21 DIAGNOSIS — O99280 Endocrine, nutritional and metabolic diseases complicating pregnancy, unspecified trimester: Secondary | ICD-10-CM

## 2016-05-21 DIAGNOSIS — B009 Herpesviral infection, unspecified: Secondary | ICD-10-CM

## 2016-05-21 DIAGNOSIS — O139 Gestational [pregnancy-induced] hypertension without significant proteinuria, unspecified trimester: Secondary | ICD-10-CM

## 2016-05-21 NOTE — Patient Instructions (Signed)
Levonorgestrel intrauterine device (IUD) What is this medicine? LEVONORGESTREL IUD (LEE voe nor jes trel) is a contraceptive (birth control) device. The device is placed inside the uterus by a healthcare professional. It is used to prevent pregnancy and can also be used to treat heavy bleeding that occurs during your period. Depending on the device, it can be used for 3 to 5 years. This medicine may be used for other purposes; ask your health care provider or pharmacist if you have questions. What should I tell my health care provider before I take this medicine? They need to know if you have any of these conditions: -abnormal Pap smear -cancer of the breast, uterus, or cervix -diabetes -endometritis -genital or pelvic infection now or in the past -have more than one sexual partner or your partner has more than one partner -heart disease -history of an ectopic or tubal pregnancy -immune system problems -IUD in place -liver disease or tumor -problems with blood clots or take blood-thinners -use intravenous drugs -uterus of unusual shape -vaginal bleeding that has not been explained -an unusual or allergic reaction to levonorgestrel, other hormones, silicone, or polyethylene, medicines, foods, dyes, or preservatives -pregnant or trying to get pregnant -breast-feeding How should I use this medicine? This device is placed inside the uterus by a health care professional. Talk to your pediatrician regarding the use of this medicine in children. Special care may be needed. Overdosage: If you think you have taken too much of this medicine contact a poison control center or emergency room at once. NOTE: This medicine is only for you. Do not share this medicine with others. What if I miss a dose? This does not apply. What may interact with this medicine? Do not take this medicine with any of the following medications: -amprenavir -bosentan -fosamprenavir This medicine may also interact with  the following medications: -aprepitant -barbiturate medicines for inducing sleep or treating seizures -bexarotene -griseofulvin -medicines to treat seizures like carbamazepine, ethotoin, felbamate, oxcarbazepine, phenytoin, topiramate -modafinil -pioglitazone -rifabutin -rifampin -rifapentine -some medicines to treat HIV infection like atazanavir, indinavir, lopinavir, nelfinavir, tipranavir, ritonavir -St. John's wort -warfarin This list may not describe all possible interactions. Give your health care provider a list of all the medicines, herbs, non-prescription drugs, or dietary supplements you use. Also tell them if you smoke, drink alcohol, or use illegal drugs. Some items may interact with your medicine. What should I watch for while using this medicine? Visit your doctor or health care professional for regular check ups. See your doctor if you or your partner has sexual contact with others, becomes HIV positive, or gets a sexual transmitted disease. This product does not protect you against HIV infection (AIDS) or other sexually transmitted diseases. You can check the placement of the IUD yourself by reaching up to the top of your vagina with clean fingers to feel the threads. Do not pull on the threads. It is a good habit to check placement after each menstrual period. Call your doctor right away if you feel more of the IUD than just the threads or if you cannot feel the threads at all. The IUD may come out by itself. You may become pregnant if the device comes out. If you notice that the IUD has come out use a backup birth control method like condoms and call your health care provider. Using tampons will not change the position of the IUD and are okay to use during your period. What side effects may I notice from receiving this medicine?   Side effects that you should report to your doctor or health care professional as soon as possible: -allergic reactions like skin rash, itching or  hives, swelling of the face, lips, or tongue -fever, flu-like symptoms -genital sores -high blood pressure -no menstrual period for 6 weeks during use -pain, swelling, warmth in the leg -pelvic pain or tenderness -severe or sudden headache -signs of pregnancy -stomach cramping -sudden shortness of breath -trouble with balance, talking, or walking -unusual vaginal bleeding, discharge -yellowing of the eyes or skin Side effects that usually do not require medical attention (report to your doctor or health care professional if they continue or are bothersome): -acne -breast pain -change in sex drive or performance -changes in weight -cramping, dizziness, or faintness while the device is being inserted -headache -irregular menstrual bleeding within first 3 to 6 months of use -nausea This list may not describe all possible side effects. Call your doctor for medical advice about side effects. You may report side effects to FDA at 1-800-FDA-1088. Where should I keep my medicine? This does not apply. NOTE: This sheet is a summary. It may not cover all possible information. If you have questions about this medicine, talk to your doctor, pharmacist, or health care provider.    2016, Elsevier/Gold Standard. (2011-12-03 13:54:04) Natural Childbirth Natural childbirth is going through labor and delivery without any drugs to relieve pain. You also do not use fetal monitors, have a cesarean delivery, or get a sugical cut to enlarge the vaginal opening (episiotomy). With the help of a birthing professional (midwife), you will direct your own labor and delivery as you choose. Many women chose natural childbirth because they feel more in control and in touch with their labor and delivery. They are also concerned about the medications affecting themselves and the baby. Pregnant women with a high risk pregnancy should not attempt natural childbirth. It is better to deliver the infant in a hospital if an  emergency situation arises. Sometimes, the caregiver has to intervene for the health and safety of the mother and infant. TWO TECHNIQUES FOR NATURAL CHILDBIRTH:   The Lamaze method. This method teaches women that having a baby is normal, healthy, and natural. It also teaches the mother to take a neutral position regarding pain medication and anesthesia and to make an informed decision if and when it is right for them.  The [place]ulan Fray (also called husband coached birth). This method teaches the father to be the birth coach and stresses a natural approach. It also encourages exercise and a balanced diet with good nutrition. The exercises teach relaxation and deep breathing techniques. However, there are also classes to prepare the parents for an emergency situation that may occur. METHODS OF DEALING WITH LABOR PAIN AND DELIVERY:  Meditation.  Yoga.  Hypnosis.  Acupuncture.  Massage.  Changing positions (walking, rocking, showering, leaning on birth balls).  Lying in warm water or a jacuzzi.  Find an activity that keeps your mind off of the labor pain.  Listen to soft music.  Visual imagery (focus on a particular object). BEFORE GOING INTO LABOR  Be sure you and your spouse/partner are in agreement to have natural childbirth.  Decide if your caregiver or a midwife will deliver your baby.  Decide if you will have your baby in the hospital, birthing center, or at home.  If you have children, make plans to have someone to take care of them when you go to the hospital.  Know the distance and the time it  takes to go to the delivery center. Make a dry run to be sure.  Have a bag packed with a night gown, bathrobe, and toiletries ready to take when you go into labor.  Keep phone numbers of your family and friends handy if you need to call someone when you go into labor.  Your spouse or partner should go to all the teaching classes.  Talk with your caregiver about the  possibility of a medical emergency and what will happen if that occurs. ADVANTAGES OF NATURAL CHILDBIRTH  You are in control of your labor and delivery.  It is safe.  There are no medications or anesthetics that may affect you and the fetus.  There are no invasive procedures such as an episiotomy.  You and your partner will work together, which can increase your bond.  Meditation, yoga, massage, and breathing exercises can be learned while pregnant and help you when you are in labor and at delivery.  In most delivery centers, the family and friends can be involved in the labor and delivery process. DISADVANTAGES OF NATURAL CHILDBIRTH  You will experience pain during your labor and delivery.  The methods of helping relieve your labor pains may not work for you.  You may feel embarrassed, disappointed, and like a failure if you decide to change your mind during labor and not have natural childbirth. AFTER THE DELIVERY  You will be very tired.  You will be uncomfortable because of your uterus contracting. You will feel soreness around the vagina.  You may feel cold and shaky.This is a natural reaction.  You will be excited, overwhelmed, accomplished, and proud to be a mother. HOME CARE INSTRUCTIONS   Follow the advice and instructions of your caregiver.  Follow the instructions of your natural childbirth instructor ([place]aze or [place]).   This information is not intended to replace advice given to you by your health care provider. Make sure you discuss any questions you have with your health care provider.   Document Released: 10/15/2008 Document Revised: 01/25/2012 Document Reviewed: 07/10/2013 Elsevier Interactive Patient Education [employer].

## 2016-05-21 NOTE — Progress Notes (Signed)
Subjective:  Jacqueline Peterson is a 30 y.o. G1P0 at [redacted]w[redacted]d being seen today for ongoing prenatal care.  She is currently monitored for the following issues for this low-risk pregnancy and has Anxiety; Insomnia; Severe obesity (BMI >= 40) ([place]); Screen for STD (sexually transmitted disease); Routine general medical examination at a health care facility; Herpes; Palpitations; Esophageal reflux; Hypothyroidism; Supervision of high risk pregnancy, antepartum; Hypothyroidism affecting pregnancy; and Elevated blood pressure affecting pregnancy, antepartum on her problem list.  Patient reports no complaints.  Contractions: Not present. Vag. Bleeding: None.  Movement: Present. Denies leaking of fluid.   The following portions of the patient's history were reviewed and updated as appropriate: allergies, current medications, past family history, past medical history, past social history, past surgical history and problem list. Problem list updated.  Objective:   Filed Vitals:   05/21/16 1517  BP: 130/78  Pulse: 90  Weight: 281 lb (127.461 kg)    Fetal Status: Fetal Heart Rate (bpm): 136   Movement: Present     General:  Alert, oriented and cooperative. Patient is in no acute distress.  Skin: Skin is warm and dry. No rash noted.   Cardiovascular: Normal heart rate noted  Respiratory: Normal respiratory effort, no problems with respiration noted  Abdomen: Soft, gravid, appropriate for gestational age. Pain/Pressure: Absent     Pelvic:  Cervical exam deferred        Extremities: Normal range of motion.  Edema: Trace  Mental Status: Normal mood and affect. Normal behavior. Normal judgment and thought content.   Urinalysis: Urine Protein: Trace Urine Glucose: Negative  Assessment and Plan:  Pregnancy: G1P0 at [redacted]w[redacted]d  1. Supervision of high risk pregnancy, antepartum, third trimester Reviewed PP contraception. Pt thinks that she will do lnIUD  2. Hypothyroidism affecting pregnancy  3. Elevated  blood pressure affecting pregnancy, antepartum BP WNL  4. Morbid obesity due to excess calories ([place])  5. Herpes (need to address with partner out of room n subsequent visit)  Preterm labor symptoms and general obstetric precautions including but not limited to vaginal bleeding, contractions, leaking of fluid and fetal movement were reviewed in detail with the patient. Please refer to After Visit Summary for other counseling recommendations.  No Follow-up on file.   Lavonia Drafts, MD

## 2016-05-25 ENCOUNTER — Encounter: Payer: Self-pay | Admitting: *Deleted

## 2016-06-09 ENCOUNTER — Ambulatory Visit (INDEPENDENT_AMBULATORY_CARE_PROVIDER_SITE_OTHER): Payer: 59 | Admitting: Obstetrics & Gynecology

## 2016-06-09 DIAGNOSIS — O0993 Supervision of high risk pregnancy, unspecified, third trimester: Secondary | ICD-10-CM

## 2016-06-09 MED FILL — LEVOTHYROXINE 75 MCG TABLET: 75 | 90 days supply | Qty: 90 | Fill #1

## 2016-06-09 MED FILL — PANTOPRAZOLE SOD DR 40 MG T: 40 | 90 days supply | Qty: 180 | Fill #1

## 2016-06-09 NOTE — Progress Notes (Signed)
Subjective:  Jacqueline Peterson is a 30 y.o. G1P0 at [redacted]w[redacted]d being seen today for ongoing prenatal care.  She is currently monitored for the following issues for this low-risk pregnancy and has Anxiety; Insomnia; Severe obesity (BMI >= 40) ([place]); Screen for STD (sexually transmitted disease); Routine general medical examination at a health care facility; Herpes; Palpitations; Esophageal reflux; Hypothyroidism; Supervision of high risk pregnancy, antepartum; Hypothyroidism affecting pregnancy; and Elevated blood pressure affecting pregnancy, antepartum on her problem list.  Patient reports no complaints.  Contractions: Irregular. Vag. Bleeding: None.  Movement: Present. Denies leaking of fluid.   The following portions of the patient's history were reviewed and updated as appropriate: allergies, current medications, past family history, past medical history, past social history, past surgical history and problem list. Problem list updated.  Objective:   Vitals:   06/09/16 1542  BP: 130/82  Weight: 285 lb (129.3 kg)    Fetal Status: Fetal Heart Rate (bpm): 147   Movement: Present     General:  Alert, oriented and cooperative. Patient is in no acute distress.  Skin: Skin is warm and dry. No rash noted.   Cardiovascular: Normal heart rate noted  Respiratory: Normal respiratory effort, no problems with respiration noted  Abdomen: Soft, gravid, appropriate for gestational age. Pain/Pressure: Present     Pelvic:  Cervical exam deferred        Extremities: Normal range of motion.  Edema: Trace  Mental Status: Normal mood and affect. Normal behavior. Normal judgment and thought content.   Urinalysis:      Assessment and Plan:  Pregnancy: G1P0 at [redacted]w[redacted]d  1. Morbid obesity due to excess calories (Jacqueline Peterson)   2. Supervision of high risk pregnancy, antepartum, third trimester - Cervical cultures at next visit  Preterm labor symptoms and general obstetric precautions including but not limited to  vaginal bleeding, contractions, leaking of fluid and fetal movement were reviewed in detail with the patient. Please refer to After Visit Summary for other counseling recommendations.  No Follow-up on file.   Emily Filbert, MD

## 2016-06-16 ENCOUNTER — Ambulatory Visit (INDEPENDENT_AMBULATORY_CARE_PROVIDER_SITE_OTHER): Payer: 59 | Admitting: Obstetrics & Gynecology

## 2016-06-16 VITALS — BP 130/92 | Wt 285.0 lb

## 2016-06-16 DIAGNOSIS — O10912 Unspecified pre-existing hypertension complicating pregnancy, second trimester: Secondary | ICD-10-CM

## 2016-06-16 DIAGNOSIS — O10919 Unspecified pre-existing hypertension complicating pregnancy, unspecified trimester: Secondary | ICD-10-CM

## 2016-06-16 NOTE — Progress Notes (Signed)
Subjective:  Jacqueline Peterson is a 30 y.o. G1P0 at [redacted]w[redacted]d being seen today for ongoing prenatal care.  She is currently monitored for the following issues for this high-risk pregnancy and has Anxiety; Insomnia; Severe obesity (BMI >= 40) ([place]); Screen for STD (sexually transmitted disease); Routine general medical examination at a health care facility; Herpes; Palpitations; Esophageal reflux; Hypothyroidism; Supervision of high risk pregnancy, antepartum; Hypothyroidism affecting pregnancy; and Elevated blood pressure affecting pregnancy, antepartum on her problem list.  Patient reports no complaints.  Contractions: Irregular. Vag. Bleeding: None.  Movement: Present. Denies leaking of fluid.   The following portions of the patient's history were reviewed and updated as appropriate: allergies, current medications, past family history, past medical history, past social history, past surgical history and problem list. Problem list updated.  Objective:   Vitals:   06/16/16 1450  BP: (!) 130/92  Weight: 285 lb (129.3 kg)    Fetal Status: Fetal Heart Rate (bpm): 137 Fundal Height: 39 cm Movement: Present     General:  Alert, oriented and cooperative. Patient is in no acute distress.  Skin: Skin is warm and dry. No rash noted.   Cardiovascular: Normal heart rate noted  Respiratory: Normal respiratory effort, no problems with respiration noted  Abdomen: Soft, gravid, appropriate for gestational age. Pain/Pressure: Present     Pelvic:  Cervical exam deferred        Extremities: Normal range of motion.  Edema: Trace  Mental Status: Normal mood and affect. Normal behavior. Normal judgment and thought content.   Urinalysis: Urine Protein: Trace Urine Glucose: Negative  Assessment and Plan:  Pregnancy: G1P0 at [redacted]w[redacted]d  1. Chronic hypertension during pregnancy, antepartum -pt has had several elevated BPs prior to pregnancy at [employer].  She believes they were due to malfunction of the automatic BP  cuff.  (Today was automatic).  When taken manually, BP is always normal.  Unclear whether she is a true chronic HTN or not.  Pt does have risk factors with BMI. -Pt to take BP at work and call with results.  If elevated, will need to get labs and protein/creatinie ratio (Pt works [employer] in [place]). - [place] MFM OB FOLLOW UP; Future - [place] MFM FETAL BPP W/NONSTRESS; Future  Preterm labor symptoms and general obstetric precautions including but not limited to vaginal bleeding, contractions, leaking of fluid and fetal movement were reviewed in detail with the patient. Please refer to After Visit Summary for other counseling recommendations.  Return in about 1 week (around 06/23/2016).   [name], MD

## 2016-06-17 ENCOUNTER — Telehealth: Payer: Self-pay | Admitting: *Deleted

## 2016-06-17 NOTE — Telephone Encounter (Signed)
LM on voicemail to have someone @ work check her BP today and tomorrow and call  Office with results.  Concerned that her BP is creeping up,

## 2016-06-17 NOTE — Telephone Encounter (Signed)
Nurse from [place] from where pt works to let me know that she took Jacqueline Peterson's BP today and it was 126/78.   I will forward this info to Dr Gala Romney.

## 2016-06-17 NOTE — Telephone Encounter (Signed)
-----   Message from [name], MD sent at 06/16/2016  6:36 PM EDT ----- Please call patient and have her take her BP at work and give [place] values (write note in Epic).  I think she is a CHTN but she thinks it is the automatic BP cuff.  Let me know her results.    THANKS!!

## 2016-06-18 ENCOUNTER — Telehealth: Payer: Self-pay | Admitting: *Deleted

## 2016-06-18 NOTE — Telephone Encounter (Signed)
Pt called to report her BP today 132/82.  We are pleased with this reading.

## 2016-06-22 ENCOUNTER — Ambulatory Visit (INDEPENDENT_AMBULATORY_CARE_PROVIDER_SITE_OTHER): Payer: 59 | Admitting: Obstetrics & Gynecology

## 2016-06-22 VITALS — BP 128/84 | Wt 286.0 lb

## 2016-06-22 DIAGNOSIS — E039 Hypothyroidism, unspecified: Secondary | ICD-10-CM

## 2016-06-22 DIAGNOSIS — O0993 Supervision of high risk pregnancy, unspecified, third trimester: Secondary | ICD-10-CM

## 2016-06-22 DIAGNOSIS — O133 Gestational [pregnancy-induced] hypertension without significant proteinuria, third trimester: Secondary | ICD-10-CM

## 2016-06-22 DIAGNOSIS — O169 Unspecified maternal hypertension, unspecified trimester: Secondary | ICD-10-CM

## 2016-06-22 DIAGNOSIS — Z113 Encounter for screening for infections with a predominantly sexual mode of transmission: Secondary | ICD-10-CM

## 2016-06-22 DIAGNOSIS — O99213 Obesity complicating pregnancy, third trimester: Secondary | ICD-10-CM

## 2016-06-22 DIAGNOSIS — O99280 Endocrine, nutritional and metabolic diseases complicating pregnancy, unspecified trimester: Secondary | ICD-10-CM

## 2016-06-22 DIAGNOSIS — O99283 Endocrine, nutritional and metabolic diseases complicating pregnancy, third trimester: Secondary | ICD-10-CM

## 2016-06-22 LAB — OB RESULTS CONSOLE GBS: STREP GROUP B AG: POSITIVE

## 2016-06-22 LAB — OB RESULTS CONSOLE GC/CHLAMYDIA: Gonorrhea: NEGATIVE

## 2016-06-22 NOTE — Progress Notes (Signed)
Subjective:  Jacqueline Peterson is a 30 y.o. G1P0 at [redacted]w[redacted]d being seen today for ongoing prenatal care.  She is currently monitored for the following issues for this high-risk pregnancy and has Anxiety; Insomnia; Severe obesity (BMI >= 40) ([place]); Screen for STD (sexually transmitted disease); Routine general medical examination at a health care facility; Herpes; Palpitations; Esophageal reflux; Hypothyroidism; Supervision of high risk pregnancy, antepartum; Hypothyroidism affecting pregnancy; and Elevated blood pressure affecting pregnancy, antepartum on her problem list.  Patient reports no complaints.  Contractions: Irregular. Vag. Bleeding: None.  Movement: Present. Denies leaking of fluid.   The following portions of the patient's history were reviewed and updated as appropriate: allergies, current medications, past family history, past medical history, past social history, past surgical history and problem list. Problem list updated.  Objective:   Vitals:   06/22/16 1441  BP: 128/84  Weight: 286 lb (129.7 kg)    Fetal Status: Fetal Heart Rate (bpm): 130   Movement: Present     General:  Alert, oriented and cooperative. Patient is in no acute distress.  Skin: Skin is warm and dry. No rash noted.   Cardiovascular: Normal heart rate noted  Respiratory: Normal respiratory effort, no problems with respiration noted  Abdomen: Soft, gravid, appropriate for gestational age. Pain/Pressure: Present     Pelvic:  Cervical exam deferred        Extremities: Normal range of motion.  Edema: Trace  Mental Status: Normal mood and affect. Normal behavior. Normal judgment and thought content.   Urinalysis:      Assessment and Plan:  Pregnancy: G1P0 at [redacted]w[redacted]d  1. Elevated blood pressure affecting pregnancy, antepartum   2. Hypothyroidism affecting pregnancy   3. Morbid obesity due to excess calories ([place])   4. Supervision of high risk pregnancy, antepartum, third trimester  - Culture, beta  strep (group b only) - GC/Chlamydia probe amp ([place])not at [facility]  Preterm labor symptoms and general obstetric precautions including but not limited to vaginal bleeding, contractions, leaking of fluid and fetal movement were reviewed in detail with the patient. Please refer to After Visit Summary for other counseling recommendations.  No Follow-up on file.   Emily Filbert, MD

## 2016-06-24 ENCOUNTER — Other Ambulatory Visit: Payer: Self-pay | Admitting: Obstetrics & Gynecology

## 2016-06-24 ENCOUNTER — Ambulatory Visit (HOSPITAL_COMMUNITY)
Admission: RE | Admit: 2016-06-24 | Discharge: 2016-06-24 | Disposition: A | Discharge status: Home / Self Care | Payer: 59 | Source: Ambulatory Visit | Attending: Obstetrics & Gynecology | Admitting: Obstetrics & Gynecology

## 2016-06-24 ENCOUNTER — Encounter (HOSPITAL_COMMUNITY): Payer: Self-pay

## 2016-06-24 DIAGNOSIS — O10013 Pre-existing essential hypertension complicating pregnancy, third trimester: Secondary | ICD-10-CM | POA: Diagnosis not present

## 2016-06-24 DIAGNOSIS — Z3A36 36 weeks gestation of pregnancy: Secondary | ICD-10-CM | POA: Insufficient documentation

## 2016-06-24 DIAGNOSIS — O99213 Obesity complicating pregnancy, third trimester: Secondary | ICD-10-CM | POA: Diagnosis not present

## 2016-06-24 DIAGNOSIS — O10912 Unspecified pre-existing hypertension complicating pregnancy, second trimester: Secondary | ICD-10-CM | POA: Diagnosis not present

## 2016-06-24 DIAGNOSIS — O10919 Unspecified pre-existing hypertension complicating pregnancy, unspecified trimester: Secondary | ICD-10-CM

## 2016-06-24 LAB — CULTURE, BETA STREP (GROUP B ONLY)

## 2016-06-24 LAB — GC/CHLAMYDIA PROBE AMP (~~LOC~~) NOT AT ARMC
CHLAMYDIA, DNA PROBE: NEGATIVE
Neisseria Gonorrhea: NEGATIVE

## 2016-06-29 ENCOUNTER — Encounter: Payer: Self-pay | Admitting: Obstetrics & Gynecology

## 2016-06-29 DIAGNOSIS — O99820 Streptococcus B carrier state complicating pregnancy: Secondary | ICD-10-CM | POA: Insufficient documentation

## 2016-06-30 ENCOUNTER — Ambulatory Visit (INDEPENDENT_AMBULATORY_CARE_PROVIDER_SITE_OTHER): Payer: 59 | Admitting: Obstetrics & Gynecology

## 2016-06-30 VITALS — BP 124/86 | Wt 286.0 lb

## 2016-06-30 DIAGNOSIS — O99820 Streptococcus B carrier state complicating pregnancy: Secondary | ICD-10-CM

## 2016-06-30 DIAGNOSIS — O99280 Endocrine, nutritional and metabolic diseases complicating pregnancy, unspecified trimester: Secondary | ICD-10-CM

## 2016-06-30 DIAGNOSIS — E039 Hypothyroidism, unspecified: Secondary | ICD-10-CM

## 2016-06-30 DIAGNOSIS — O99213 Obesity complicating pregnancy, third trimester: Secondary | ICD-10-CM

## 2016-06-30 DIAGNOSIS — Z22330 Carrier of Group B streptococcus: Secondary | ICD-10-CM

## 2016-06-30 DIAGNOSIS — O99283 Endocrine, nutritional and metabolic diseases complicating pregnancy, third trimester: Secondary | ICD-10-CM

## 2016-06-30 DIAGNOSIS — O0993 Supervision of high risk pregnancy, unspecified, third trimester: Secondary | ICD-10-CM

## 2016-06-30 NOTE — Progress Notes (Signed)
Subjective:  Jacqueline Peterson is a 30 y.o. G1P0 at [redacted]w[redacted]d being seen today for ongoing prenatal care.  She is currently monitored for the following issues for this low-risk pregnancy and has Anxiety; Insomnia; Severe obesity (BMI >= 40) ([place]); Screen for STD (sexually transmitted disease); Routine general medical examination at a health care facility; Herpes; Palpitations; Esophageal reflux; Hypothyroidism; Supervision of high risk pregnancy, antepartum; Hypothyroidism affecting pregnancy; Elevated blood pressure affecting pregnancy, antepartum; and GBS (group B Streptococcus carrier), +RV culture, currently pregnant on her problem list.  Patient reports no complaints.  Contractions: Irregular. Vag. Bleeding: None.  Movement: Present. Denies leaking of fluid.   The following portions of the patient's history were reviewed and updated as appropriate: allergies, current medications, past family history, past medical history, past social history, past surgical history and problem list. Problem list updated.  Objective:   Vitals:   06/30/16 1535  BP: 124/86  Weight: 286 lb (129.7 kg)    Fetal Status: Fetal Heart Rate (bpm): 143   Movement: Present     General:  Alert, oriented and cooperative. Patient is in no acute distress.  Skin: Skin is warm and dry. No rash noted.   Cardiovascular: Normal heart rate noted  Respiratory: Normal respiratory effort, no problems with respiration noted  Abdomen: Soft, gravid, appropriate for gestational age. Pain/Pressure: Present     Pelvic:  Cervical exam performed        Extremities: Normal range of motion.  Edema: Trace  Mental Status: Normal mood and affect. Normal behavior. Normal judgment and thought content.   Urinalysis: Urine Protein: Negative Urine Glucose: Negative  Assessment and Plan:  Pregnancy: G1P0 at [redacted]w[redacted]d  1. Supervision of high risk pregnancy, antepartum, third trimester   2. Morbid obesity due to excess calories ([place])   3.  Hypothyroidism affecting pregnancy - TSH normal 6/17  4. GBS (group B Streptococcus carrier), +RV culture, currently pregnant - Treat in labor  Preterm labor symptoms and general obstetric precautions including but not limited to vaginal bleeding, contractions, leaking of fluid and fetal movement were reviewed in detail with the patient. Please refer to After Visit Summary for other counseling recommendations.  No Follow-up on file.   Emily Filbert, MD

## 2016-07-07 ENCOUNTER — Ambulatory Visit (INDEPENDENT_AMBULATORY_CARE_PROVIDER_SITE_OTHER): Payer: 59 | Admitting: Obstetrics & Gynecology

## 2016-07-07 VITALS — Wt 284.0 lb

## 2016-07-07 DIAGNOSIS — E039 Hypothyroidism, unspecified: Secondary | ICD-10-CM

## 2016-07-07 DIAGNOSIS — O0993 Supervision of high risk pregnancy, unspecified, third trimester: Secondary | ICD-10-CM

## 2016-07-07 DIAGNOSIS — O99283 Endocrine, nutritional and metabolic diseases complicating pregnancy, third trimester: Secondary | ICD-10-CM

## 2016-07-07 NOTE — Progress Notes (Signed)
Subjective:  Jacqueline Peterson is a 30 y.o. G1P0 at [redacted]w[redacted]d being seen today for ongoing prenatal care.  She is currently monitored for the following issues for this low-risk pregnancy and has Anxiety; Insomnia; Severe obesity (BMI >= 40) ([place]); Screen for STD (sexually transmitted disease); Routine general medical examination at a health care facility; Herpes; Palpitations; Esophageal reflux; Hypothyroidism; Supervision of high risk pregnancy, antepartum; Hypothyroidism affecting pregnancy; Elevated blood pressure affecting pregnancy, antepartum; and GBS (group B Streptococcus carrier), +RV culture, currently pregnant on her problem list.  Patient reports no complaints.  Contractions: Irritability. Vag. Bleeding: None.  Movement: Present. Denies leaking of fluid.   The following portions of the patient's history were reviewed and updated as appropriate: allergies, current medications, past family history, past medical history, past social history, past surgical history and problem list. Problem list updated.  Objective:   Vitals:   07/07/16 1618  Weight: 284 lb (128.8 kg)    Fetal Status:     Movement: Present     General:  Alert, oriented and cooperative. Patient is in no acute distress.  Skin: Skin is warm and dry. No rash noted.   Cardiovascular: Normal heart rate noted  Respiratory: Normal respiratory effort, no problems with respiration noted  Abdomen: Soft, gravid, appropriate for gestational age. Pain/Pressure: Present     Pelvic:  Cervical exam performed        Extremities: Normal range of motion.  Edema: Mild pitting, slight indentation  Mental Status: Normal mood and affect. Normal behavior. Normal judgment and thought content.   Urinalysis:      Assessment and Plan:  Pregnancy: G1P0 at [redacted]w[redacted]d  There are no diagnoses linked to this encounter. Term labor symptoms and general obstetric precautions including but not limited to vaginal bleeding, contractions, leaking of fluid  and fetal movement were reviewed in detail with the patient.  REC DEL by [facility] Please refer to After Visit Summary for other counseling recommendations.  No Follow-up on file.   Emily Filbert, MD

## 2016-07-14 ENCOUNTER — Inpatient Hospital Stay (HOSPITAL_COMMUNITY)
Admission: AD | Admit: 2016-07-14 | Discharge: 2016-07-19 | DRG: 765 | Disposition: A | Discharge status: Home / Self Care | Payer: 59 | Source: Ambulatory Visit | Attending: Obstetrics and Gynecology | Admitting: Obstetrics and Gynecology

## 2016-07-14 ENCOUNTER — Ambulatory Visit (INDEPENDENT_AMBULATORY_CARE_PROVIDER_SITE_OTHER): Payer: 59 | Admitting: Obstetrics & Gynecology

## 2016-07-14 ENCOUNTER — Encounter (HOSPITAL_COMMUNITY): Payer: Self-pay | Admitting: *Deleted

## 2016-07-14 VITALS — BP 158/99 | HR 112 | Wt 284.0 lb

## 2016-07-14 DIAGNOSIS — O99820 Streptococcus B carrier state complicating pregnancy: Secondary | ICD-10-CM

## 2016-07-14 DIAGNOSIS — O133 Gestational [pregnancy-induced] hypertension without significant proteinuria, third trimester: Secondary | ICD-10-CM

## 2016-07-14 DIAGNOSIS — E039 Hypothyroidism, unspecified: Secondary | ICD-10-CM | POA: Diagnosis present

## 2016-07-14 DIAGNOSIS — O169 Unspecified maternal hypertension, unspecified trimester: Secondary | ICD-10-CM

## 2016-07-14 DIAGNOSIS — Z3A39 39 weeks gestation of pregnancy: Secondary | ICD-10-CM

## 2016-07-14 DIAGNOSIS — O1414 Severe pre-eclampsia complicating childbirth: Secondary | ICD-10-CM

## 2016-07-14 DIAGNOSIS — O1410 Severe pre-eclampsia, unspecified trimester: Secondary | ICD-10-CM

## 2016-07-14 DIAGNOSIS — O99213 Obesity complicating pregnancy, third trimester: Secondary | ICD-10-CM

## 2016-07-14 DIAGNOSIS — O99283 Endocrine, nutritional and metabolic diseases complicating pregnancy, third trimester: Secondary | ICD-10-CM

## 2016-07-14 DIAGNOSIS — O99214 Obesity complicating childbirth: Secondary | ICD-10-CM | POA: Diagnosis present

## 2016-07-14 DIAGNOSIS — O99824 Streptococcus B carrier state complicating childbirth: Secondary | ICD-10-CM | POA: Diagnosis not present

## 2016-07-14 DIAGNOSIS — O99280 Endocrine, nutritional and metabolic diseases complicating pregnancy, unspecified trimester: Secondary | ICD-10-CM

## 2016-07-14 DIAGNOSIS — O0993 Supervision of high risk pregnancy, unspecified, third trimester: Secondary | ICD-10-CM

## 2016-07-14 DIAGNOSIS — Z6841 Body Mass Index (BMI) 40.0 and over, adult: Secondary | ICD-10-CM

## 2016-07-14 DIAGNOSIS — Z3A40 40 weeks gestation of pregnancy: Secondary | ICD-10-CM | POA: Diagnosis not present

## 2016-07-14 DIAGNOSIS — O99284 Endocrine, nutritional and metabolic diseases complicating childbirth: Secondary | ICD-10-CM | POA: Diagnosis present

## 2016-07-14 DIAGNOSIS — Z22330 Carrier of Group B streptococcus: Secondary | ICD-10-CM

## 2016-07-14 DIAGNOSIS — O324XX0 Maternal care for high head at term, not applicable or unspecified: Secondary | ICD-10-CM | POA: Diagnosis not present

## 2016-07-14 HISTORY — DX: Herpesviral infection, unspecified: B00.9

## 2016-07-14 HISTORY — DX: Hypothyroidism, unspecified: E03.9

## 2016-07-14 LAB — CBC
HCT: 32.5 % — ABNORMAL LOW (ref 36.0–46.0)
Hemoglobin: 11 g/dL — ABNORMAL LOW (ref 12.0–15.0)
MCH: 29.5 pg (ref 26.0–34.0)
MCHC: 33.8 g/dL (ref 30.0–36.0)
MCV: 87.1 fL (ref 78.0–100.0)
PLATELETS: 310 10*3/uL (ref 150–400)
RBC: 3.73 MIL/uL — AB (ref 3.87–5.11)
RDW: 14.1 % (ref 11.5–15.5)
WBC: 12.1 10*3/uL — ABNORMAL HIGH (ref 4.0–10.5)

## 2016-07-14 LAB — COMPREHENSIVE METABOLIC PANEL
ALT: 12 U/L — ABNORMAL LOW (ref 14–54)
ANION GAP: 7 (ref 5–15)
AST: 15 U/L (ref 15–41)
Albumin: 3.1 g/dL — ABNORMAL LOW (ref 3.5–5.0)
Alkaline Phosphatase: 118 U/L (ref 38–126)
BUN: 5 mg/dL — ABNORMAL LOW (ref 6–20)
CHLORIDE: 105 mmol/L (ref 101–111)
CO2: 23 mmol/L (ref 22–32)
Calcium: 8.8 mg/dL — ABNORMAL LOW (ref 8.9–10.3)
Creatinine, Ser: 0.66 mg/dL (ref 0.44–1.00)
Glucose, Bld: 80 mg/dL (ref 65–99)
Potassium: 3.6 mmol/L (ref 3.5–5.1)
SODIUM: 135 mmol/L (ref 135–145)
Total Bilirubin: 0.2 mg/dL — ABNORMAL LOW (ref 0.3–1.2)
Total Protein: 6.8 g/dL (ref 6.5–8.1)

## 2016-07-14 LAB — PROTEIN / CREATININE RATIO, URINE
CREATININE, URINE: 166 mg/dL
Protein Creatinine Ratio: 0.1 mg/mg{Cre} (ref 0.00–0.15)
TOTAL PROTEIN, URINE: 16 mg/dL

## 2016-07-14 LAB — TYPE AND SCREEN
ABO/RH(D): B POS
Antibody Screen: NEGATIVE

## 2016-07-14 LAB — ABO/RH: ABO/RH(D): B POS

## 2016-07-14 MED ORDER — LABETALOL HCL 5 MG/ML IV SOLN
INTRAVENOUS | Status: AC
  Administered 2016-07-14: 40 mg via INTRAVENOUS
  Filled 2016-07-14: qty 4

## 2016-07-14 MED ORDER — OXYCODONE-ACETAMINOPHEN 5-325 MG PO TABS: 1.0000 | ORAL_TABLET | ORAL | Status: DC | PRN

## 2016-07-14 MED ORDER — LIDOCAINE HCL (PF) 1 % IJ SOLN
30.0000 mL | INTRAMUSCULAR | Status: DC | PRN
  Filled 2016-07-14: qty 30

## 2016-07-14 MED ORDER — HYDRALAZINE HCL 20 MG/ML IJ SOLN: 10.0000 mg | Freq: Once | INTRAMUSCULAR | Status: DC | PRN

## 2016-07-14 MED ORDER — MAGNESIUM SULFATE BOLUS VIA INFUSION
4.0000 g | Freq: Once | INTRAVENOUS | Status: AC
  Administered 2016-07-14: 4 g via INTRAVENOUS
  Filled 2016-07-14: qty 500

## 2016-07-14 MED ORDER — LABETALOL HCL 5 MG/ML IV SOLN
20.0000 mg | INTRAVENOUS | Status: AC | PRN
  Administered 2016-07-14: 40 mg via INTRAVENOUS
  Administered 2016-07-15: 80 mg via INTRAVENOUS
  Administered 2016-07-16: 20 mg via INTRAVENOUS
  Filled 2016-07-14: qty 16
  Filled 2016-07-14: qty 8
  Filled 2016-07-14: qty 4

## 2016-07-14 MED ORDER — MISOPROSTOL 25 MCG QUARTER TABLET
25.0000 ug | ORAL_TABLET | ORAL | Status: DC | PRN
  Administered 2016-07-14: 25 ug via VAGINAL
  Filled 2016-07-14 (×2): qty 0.25

## 2016-07-14 MED ORDER — LORATADINE 10 MG PO TABS
10.0000 mg | ORAL_TABLET | Freq: Every day | ORAL | Status: DC
  Administered 2016-07-14 – 2016-07-15 (×2): 10 mg via ORAL
  Filled 2016-07-14 (×4): qty 1

## 2016-07-14 MED ORDER — OXYTOCIN BOLUS FROM INFUSION: 500.0000 mL | Freq: Once | INTRAVENOUS | Status: DC

## 2016-07-14 MED ORDER — TERBUTALINE SULFATE 1 MG/ML IJ SOLN: 0.2500 mg | Freq: Once | INTRAMUSCULAR | Status: DC | PRN

## 2016-07-14 MED ORDER — MAGNESIUM SULFATE 50 % IJ SOLN
2.0000 g/h | INTRAVENOUS | Status: AC
  Administered 2016-07-14 – 2016-07-16 (×3): 2 g/h via INTRAVENOUS
  Filled 2016-07-14 (×4): qty 80

## 2016-07-14 MED ORDER — OXYCODONE-ACETAMINOPHEN 5-325 MG PO TABS: 2.0000 | ORAL_TABLET | ORAL | Status: DC | PRN

## 2016-07-14 MED ORDER — PENICILLIN G POTASSIUM 5000000 UNITS IJ SOLR
2.5000 10*6.[IU] | INTRAVENOUS | Status: DC
  Administered 2016-07-14 – 2016-07-16 (×10): 2.5 10*6.[IU] via INTRAVENOUS
  Filled 2016-07-14 (×14): qty 2.5

## 2016-07-14 MED ORDER — VALACYCLOVIR HCL 500 MG PO TABS
1000.0000 mg | ORAL_TABLET | Freq: Two times a day (BID) | ORAL | Status: DC
  Administered 2016-07-14 – 2016-07-15 (×2): 1000 mg via ORAL
  Filled 2016-07-14 (×6): qty 2

## 2016-07-14 MED ORDER — ONDANSETRON HCL 4 MG/2ML IJ SOLN
4.0000 mg | Freq: Four times a day (QID) | INTRAMUSCULAR | Status: DC | PRN
  Administered 2016-07-15 – 2016-07-16 (×3): 4 mg via INTRAVENOUS
  Filled 2016-07-14 (×3): qty 2

## 2016-07-14 MED ORDER — SOD CITRATE-CITRIC ACID 500-334 MG/5ML PO SOLN
30.0000 mL | ORAL | Status: DC | PRN
  Administered 2016-07-16: 30 mL via ORAL
  Filled 2016-07-14: qty 15

## 2016-07-14 MED ORDER — ACETAMINOPHEN 325 MG PO TABS
650.0000 mg | ORAL_TABLET | ORAL | Status: DC | PRN
  Administered 2016-07-15: 650 mg via ORAL
  Filled 2016-07-14: qty 2

## 2016-07-14 MED ORDER — OXYTOCIN 40 UNITS IN LACTATED RINGERS INFUSION - SIMPLE MED
2.5000 [IU]/h | INTRAVENOUS | Status: DC
  Filled 2016-07-14: qty 1000

## 2016-07-14 MED ORDER — LACTATED RINGERS IV SOLN
INTRAVENOUS | Status: DC
  Administered 2016-07-14 – 2016-07-15 (×4): via INTRAVENOUS

## 2016-07-14 MED ORDER — PENICILLIN G POTASSIUM 5000000 UNITS IJ SOLR
5.0000 10*6.[IU] | Freq: Once | INTRAVENOUS | Status: AC
  Administered 2016-07-14: 5 10*6.[IU] via INTRAVENOUS
  Filled 2016-07-14: qty 5

## 2016-07-14 MED ORDER — LABETALOL HCL 5 MG/ML IV SOLN
20.0000 mg | INTRAVENOUS | Status: DC | PRN
  Administered 2016-07-14: 20 mg via INTRAVENOUS

## 2016-07-14 MED ORDER — FLEET ENEMA 7-19 GM/118ML RE ENEM: 1.0000 | ENEMA | RECTAL | Status: DC | PRN

## 2016-07-14 MED ORDER — LEVOTHYROXINE SODIUM 75 MCG PO TABS
75.0000 ug | ORAL_TABLET | Freq: Every day | ORAL | Status: DC
  Administered 2016-07-15: 75 ug via ORAL
  Filled 2016-07-14 (×3): qty 1

## 2016-07-14 MED ORDER — LACTATED RINGERS IV SOLN
500.0000 mL | INTRAVENOUS | Status: DC | PRN
  Administered 2016-07-15: 1000 mL via INTRAVENOUS

## 2016-07-14 NOTE — Progress Notes (Signed)
OB Note Plan of care d/w pt and family. No s/s of pre-eclampsia. BPs still in the severe range so getting meds to help and Mg is already infusing, given most recent BP 160s/90s in the room   Patient Vitals for the past 24 hrs:  BP Temp Temp src Pulse Resp Height Weight  07/14/16 2121 (!) 155/95 - - 91 20 - -  07/14/16 2103 (!) 157/97 98.4 F (36.9 C) Oral 97 20 - -  07/14/16 2048 (!) 150/73 - - 89 - - -  07/14/16 2032 (!) 147/103 - - 88 20 - -  07/14/16 2018 (!) 142/68 - - 89 (!) 22 - -  07/14/16 2005 138/69 98.4 F (36.9 C) Oral (!) 122 (!) 22 - -  07/14/16 1953 (!) 150/93 98.3 F (36.8 C) Oral 93 (!) 25 - -  07/14/16 1942 (!) 156/82 98.8 F (37.1 C) Oral 88 (!) 24 - -  07/14/16 1932 (!) 151/92 - - 92 18 - -  07/14/16 1922 (!) 150/81 - - 86 20 - -  07/14/16 1912 (!) 152/80 - - 88 16 - -  07/14/16 1905 (!) 158/87 99.5 F (37.5 C) Oral 96 18 5\' 2"  (1.575 m) 128.8 kg (284 lb)  07/14/16 1902 (!) 158/87 - - 90 16 - -  07/14/16 1900 (!) 163/96 - - 97 16 - -  07/14/16 1815 (!) 161/108 - - 107 - - -  07/14/16 1756 165/98 - - 104 - - -  07/14/16 1745 (!) 164/106 - - 103 - - -  07/14/16 1730 153/100 - - 93 - - -  07/14/16 1718 (!) 161/106 - - 97 - - -  07/14/16 1716 158/91 - - 98 - - -  07/14/16 1706 148/93 - - 104 - - -  07/14/16 1640 (!) 148/105 98.5 F (36.9 C) Oral 99 18 - -   Fetus category I with accels.  Toco quiet NAD  A/P: pt stable Continue with current plan of care cytotec 25 placed at 8pm and FB once more dilated D/w them re: potential long latency period to get her into active labor which is okay as long as maternal fetal status reassuring Repeat labs later in the shift HSV: +valtrex use since 36wks, denies any prodromal s/s.  Durene Romans MD Attending Center for [employer])

## 2016-07-14 NOTE — Patient Instructions (Signed)
Labor Induction Labor induction is when steps are taken to cause a pregnant woman to begin the labor process. Most women go into labor on their own between 37 weeks and 42 weeks of the pregnancy. When this does not happen or when there is a medical need, methods may be used to induce labor. Labor induction causes a pregnant woman's uterus to contract. It also causes the cervix to soften (ripen), open (dilate), and thin out (efface). Usually, labor is not induced before 39 weeks of the pregnancy unless there is a problem with the baby or mother.  Before inducing labor, your health care provider will consider a number of factors, including the following:  The medical condition of you and the baby.   How many weeks along you are.   The status of the baby's lung maturity.   The condition of the cervix.   The position of the baby.  WHAT ARE THE REASONS FOR LABOR INDUCTION? Labor may be induced for the following reasons:  The health of the baby or mother is at risk.   The pregnancy is overdue by 1 week or more.   The water breaks but labor does not start on its own.   The mother has a health condition or serious illness, such as high blood pressure, infection, placental abruption, or diabetes.  The amniotic fluid amounts are low around the baby.   The baby is distressed.  Convenience or wanting the baby to be born on a certain date is not a reason for inducing labor. WHAT METHODS ARE USED FOR LABOR INDUCTION? Several methods of labor induction may be used, such as:   Prostaglandin medicine. This medicine causes the cervix to dilate and ripen. The medicine will also start contractions. It can be taken by mouth or by inserting a suppository into the vagina.   Inserting a thin tube (catheter) with a balloon on the end into the vagina to dilate the cervix. Once inserted, the balloon is expanded with water, which causes the cervix to open.   Stripping the membranes. Your health  care provider separates amniotic sac tissue from the cervix, causing the cervix to be stretched and causing the release of a hormone called progesterone. This may cause the uterus to contract. It is often done during an office visit. You will be sent home to wait for the contractions to begin. You will then come in for an induction.   Breaking the water. Your health care provider makes a hole in the amniotic sac using a small instrument. Once the amniotic sac breaks, contractions should begin. This may still take hours to see an effect.   Medicine to trigger or strengthen contractions. This medicine is given through an IV access tube inserted into a vein in your arm.  All of the methods of induction, besides stripping the membranes, will be done in the hospital. Induction is done in the hospital so that you and the baby can be carefully monitored.  HOW LONG DOES IT TAKE FOR LABOR TO BE INDUCED? Some inductions can take up to 2-3 days. Depending on the cervix, it usually takes less time. It takes longer when you are induced early in the pregnancy or if this is your first pregnancy. If a mother is still pregnant and the induction has been going on for 2-3 days, either the mother will be sent home or a cesarean delivery will be needed. WHAT ARE THE RISKS ASSOCIATED WITH LABOR INDUCTION? Some of the risks of induction include:  Changes in fetal heart rate, such as too high, too low, or erratic.   Fetal distress.   Chance of infection for the mother and baby.   Increased chance of having a cesarean delivery.   Breaking off (abruption) of the placenta from the uterus (rare).   Uterine rupture (very rare).  When induction is needed for medical reasons, the benefits of induction may outweigh the risks. WHAT ARE SOME REASONS FOR NOT INDUCING LABOR? Labor induction should not be done if:   It is shown that your baby does not tolerate labor.   You have had previous surgeries on your  uterus, such as a myomectomy or the removal of fibroids.   Your placenta lies very low in the uterus and blocks the opening of the cervix (placenta previa).   Your baby is not in a head-down position.   The umbilical cord drops down into the birth canal in front of the baby. This could cut off the baby's blood and oxygen supply.   You have had a previous cesarean delivery.   There are unusual circumstances, such as the baby being extremely premature.    This information is not intended to replace advice given to you by your health care provider. Make sure you discuss any questions you have with your health care provider.   Document Released: 03/24/2007 Document Revised: 11/23/2014 Document Reviewed: 06/01/2013 Elsevier Interactive Patient Education [employer].

## 2016-07-14 NOTE — Progress Notes (Signed)
Labor Progress Note Jacqueline Peterson is a 30 y.o. G1P0 at [redacted]w[redacted]d presented for severe range BP with h/o cHTN. IOL of labor for severe preE  S: Patient doing well. No issues. Not currently contracting   O:  BP (!) 155/95   Pulse 91   Temp 98.4 F (36.9 C) (Oral)   Resp 20   Ht 5\' 2"  (1.575 m)   Wt 128.8 kg (284 lb)   LMP 10/10/2015 (Approximate) Comment: within a day or two  BMI 51.94 kg/m  EFM: 140/moderate/accels present   CVE: Dilation: 1.5 Effacement (%): 60 Station: -2 Presentation: Vertex Exam by:: [name], RN   A&P: 30 y.o. G1P0 [redacted]w[redacted]d for severe range BP with h/o cHTN. IOL of labor for severe preE  #Labor: Cytotec currently, foley bulb at next check. Patient agreeable  #Pain: Planning for epidural  #FWB: Category 1 #GBS positive   # severe PreE - on Mag and labetalol for blood pressure    Montavious Wierzba Criss Rosales, MD 9:53 PM

## 2016-07-14 NOTE — MAU Note (Signed)
Went in for weekly check up today and BP was elevated. This was a new occurrence. Denies HA, visual changes, epigastric pain or swelling.  Was a "loose centimeter"

## 2016-07-14 NOTE — H&P (Signed)
LABOR AND DELIVERY ADMISSION HISTORY AND PHYSICAL NOTE  Jacqueline Peterson is a 30 y.o. female G1P0 with IUP at [redacted]w[redacted]d by LMP and first trimester U/S presenting for severe range BP with h/o cHTN.   She reports positive fetal movement. She denies leakage of fluid or vaginal bleeding.\  She denies headache, blurred vision, RUQ pain or significant swelling.   Prenatal History/Complications: h/o cHTN, morbid obesity, hypothyroidism  Past Medical History: Past Medical History:  Diagnosis Date  . History of chicken pox   . HSV infection   . Hypothyroidism due to amiodarone     Past Surgical History: Past Surgical History:  Procedure Laterality Date  . NO PAST SURGERIES      Obstetrical History: OB History    Gravida Para Term Preterm AB Living   1         0   SAB TAB Ectopic Multiple Live Births                  Social History: Social History   Social History  . Marital status: Single    Spouse name: N/A  . Number of children: 0  . Years of education: N/A   Occupational History  . [employer]    Social History Main Topics  . Smoking status: Former Smoker    Packs/day: 0.50    Years: 6.00    Quit date: 12/24/2014  . Smokeless tobacco: Never Used  . Alcohol use 0.0 oz/week     Comment: Occasional  . Drug use: No  . Sexual activity: Yes    Birth control/ protection: None   Other Topics Concern  . None   Social History Narrative   Lives in [place] with husband. Dog and cat in house. Works at [employer].      Regular Exercise -  NO   Daily Caffeine Use:  1 tea    Family History: Family History  Problem Relation Age of Onset  . Fibroids Mother   . Thyroid disease Mother     Hypo  . Basal cell carcinoma Mother   . Hypertension Mother   . Cancer Father 62    Non Hodgkins Adreanal carcinoma, Non small cell  . Hypertension Father   . Early death Father   . ADD / ADHD Sister   . Kidney disease Maternal Uncle   . Hypertension  Maternal Uncle   . Anxiety disorder Maternal Uncle   . Diabetes Maternal Grandmother     Type 1  . Pancreatitis Maternal Grandmother   . Aneurysm Maternal Grandmother     brain  . Hypertension Maternal Grandfather   . Rheumatic fever Maternal Grandfather   . Hypertension Paternal Grandmother   . Stroke Paternal Grandmother   . Lymphoma Paternal Grandfather   . Hypertension Paternal Grandfather   . Diabetes Paternal Grandfather   . Cancer Other     60's Breast Ca  . Rheum arthritis Other     Allergies: Allergies  Allergen Reactions  . Saxenda [Liraglutide -Weight Management]     nausea    Prescriptions Prior to Admission  Medication Sig Dispense Refill Last Dose  . ALPRAZolam (XANAX) 0.25 MG tablet Take 1 tablet (0.25 mg total) by mouth at bedtime as needed. (Patient not taking: Reported on 06/24/2016) 30 tablet 5 Not Taking  . Cetirizine HCl (ZYRTEC PO) Take by mouth.   Not Taking  . Doxylamine-Pyridoxine (DICLEGIS) 10-10 MG TBEC Take two at bedtime, if no improvement add another one in  the morning.  If still no improvement then add another one in the afternoon. (Patient not taking: Reported on 06/24/2016) 100 tablet 1 Not Taking  . levothyroxine (SYNTHROID, LEVOTHROID) 75 MCG tablet TAKE 1 TABLET (75 MCG TOTAL) BY MOUTH DAILY. (Patient not taking: Reported on 06/30/2016) 90 tablet 3 Not Taking  . Multiple Vitamins-Minerals (MULTI ADULT GUMMIES) CHEW Chew by mouth. Reported on 01/09/2016   Not Taking  . pantoprazole (PROTONIX) 40 MG tablet Take 1 tablet (40 mg total) by mouth 2 (two) times daily. (Patient not taking: Reported on 06/30/2016) 180 tablet 4 Not Taking  . Probiotic Product (PROBIOTIC DAILY PO) Take by mouth. Reported on 02/18/2016   Not Taking  . pyridOXINE (VITAMIN B-6) 100 MG tablet Take 100 mg by mouth daily. Reported on 02/11/2016   Not Taking  . valACYclovir (VALTREX) 1000 MG tablet Take 1 tablet (1,000 mg total) by mouth 2 (two) times daily. (Patient not taking: Reported  on 06/30/2016) 180 tablet 3 Not Taking     Review of Systems   All systems reviewed and negative except as stated in HPI  Blood pressure (!) 161/108, pulse 107, temperature 98.5 F (36.9 C), temperature source Oral, resp. rate 18, last menstrual period 10/10/2015. General appearance: alert, cooperative and no distress Lungs: no respiratory distress Heart: regular rate Abdomen: soft, non-tender Extremities: No calf swelling or tenderness Presentation: cephalic by cervical exam in clinic today Fetal monitoring: 135 baseline, mod variability, +accelerations, no decelerations Uterine activity: none  EFW: 8lbs  Prenatal labs: ABO, Rh: B/POS/-- (02/21 0924) Antibody: NEG (02/21 0924) Rubella: immune RPR: NON REAC (06/05 0935)  HBsAg: NEGATIVE (02/21 0924)  HIV: NONREACTIVE (06/05 0935)  GBS: Positive (08/07 0000)  Genetic screning:  normal Anatomy US: normal  Prenatal Transfer Tool  Maternal Diabetes: No Genetic Screening: Normal Maternal Ultrasounds/Referrals: Normal Fetal Ultrasounds or other Referrals:  None Maternal Substance Abuse:  No Significant Maternal Medications:  Meds include: Protonix Syntroid Other: valtrex Significant Maternal Lab Results: Lab values include: Group B Strep positive  Results for orders placed or performed during the hospital encounter of 07/14/16 (from the past 24 hour(s))  Protein / creatinine ratio, urine   Collection Time: 07/14/16  4:43 PM  Result Value Ref Range   Creatinine, Urine 166.00 mg/dL   Total Protein, Urine 16 mg/dL   Protein Creatinine Ratio 0.10 0.00 - 0.15 mg/mg[Cre]  CBC   Collection Time: 07/14/16  5:07 PM  Result Value Ref Range   WBC 12.1 (H) 4.0 - 10.5 K/uL   RBC 3.73 (L) 3.87 - 5.11 MIL/uL   Hemoglobin 11.0 (L) 12.0 - 15.0 g/dL   HCT 32.5 (L) 36.0 - 46.0 %   MCV 87.1 78.0 - 100.0 fL   MCH 29.5 26.0 - 34.0 pg   MCHC 33.8 30.0 - 36.0 g/dL   RDW 14.1 11.5 - 15.5 %   Platelets 310 150 - 400 K/uL  Comprehensive  metabolic panel   Collection Time: 07/14/16  5:07 PM  Result Value Ref Range   Sodium 135 135 - 145 mmol/L   Potassium 3.6 3.5 - 5.1 mmol/L   Chloride 105 101 - 111 mmol/L   CO2 23 22 - 32 mmol/L   Glucose, Bld 80 65 - 99 mg/dL   BUN PENDING 6 - 20 mg/dL   Creatinine, Ser 0.66 0.44 - 1.00 mg/dL   Calcium 8.8 (L) 8.9 - 10.3 mg/dL   Total Protein 6.8 6.5 - 8.1 g/dL   Albumin 3.1 (L) 3.5 - 5.0 g/dL  AST 15 15 - 41 U/L   ALT 12 (L) 14 - 54 U/L   Alkaline Phosphatase 118 38 - 126 U/L   Total Bilirubin 0.2 (L) 0.3 - 1.2 mg/dL   GFR calc non Af Amer >60 >60 mL/min   GFR calc Af Amer >60 >60 mL/min   Anion gap 7 5 - 15    Patient Active Problem List   Diagnosis Date Noted  . GBS (group B Streptococcus carrier), +RV culture, currently pregnant 06/29/2016  . Elevated blood pressure affecting pregnancy, antepartum 02/11/2016  . Supervision of high risk pregnancy, antepartum 01/07/2016  . Hypothyroidism affecting pregnancy 01/07/2016  . Hypothyroidism 10/04/2015  . Esophageal reflux 10/25/2014  . Palpitations 08/28/2014  . Herpes 08/14/2013  . Routine general medical examination at a health care facility 07/10/2013  . Screen for STD (sexually transmitted disease) 12/23/2012  . Severe obesity (BMI >= 40) ([place]) 03/24/2012  . Anxiety 01/15/2012  . Insomnia 01/15/2012    Assessment: MYANA SZUBA is a 30 y.o. G1P0 at [redacted]w[redacted]d here for IOL for severe pre-eclampsia secondary to conistently elevated BP.  #Severe Pre-eclampsia - pt is ordered IV labetolol protocol and has been started on magnesium. Lab including Cr, Plt, LFTs and Pr/Cr ratio are WNL. #History of HSV: continue valacyclovir until delivery.  #Labor: cytotec with foley and then pitocin #Pain: Planning for epidural #FWB: Category I #ID:  GBS pos #MOF: breast #MOC:mirena #Circ:  Girl, N/A  Bufford Lope, DO PGY-1 8/29/20176:25 PM  OB FELLOW HISTORY AND PHYSICAL ATTESTATION  I have seen and examined this patient; I  agree with above documentation in the resident's note.    Jacqueline Peterson 07/14/2016, 7:12 PM

## 2016-07-14 NOTE — Progress Notes (Signed)
   PRENATAL VISIT NOTE  Subjective:  Jacqueline Peterson is a 30 y.o. G1P0 at [redacted]w[redacted]d being seen today for ongoing prenatal care.  She is currently monitored for the following issues for this high-risk pregnancy and has Anxiety; Insomnia; Severe obesity (BMI >= 40) ([place]); Screen for STD (sexually transmitted disease); Routine general medical examination at a health care facility; Herpes; Palpitations; Esophageal reflux; Hypothyroidism; Supervision of high risk pregnancy, antepartum; Hypothyroidism affecting pregnancy; Elevated blood pressure affecting pregnancy, antepartum; and GBS (group B Streptococcus carrier), +RV culture, currently pregnant on her problem list.  Patient reports no complaints.  Contractions: Irritability. Vag. Bleeding: None.  Movement: Present. Denies leaking of fluid.   The following portions of the patient's history were reviewed and updated as appropriate: allergies, current medications, past family history, past medical history, past social history, past surgical history and problem list. Problem list updated.  Objective:   Vitals:   07/14/16 1437  BP: (!) 158/99  Pulse: (!) 112  Weight: 284 lb (128.8 kg)    Fetal Status:   Fundal Height: 41 cm Movement: Present  Presentation: Vertex  General:  Alert, oriented and cooperative. Patient is in no acute distress.  Skin: Skin is warm and dry. No rash noted.   Cardiovascular: Normal heart rate noted  Respiratory: Normal respiratory effort, no problems with respiration noted  Abdomen: Soft, gravid, appropriate for gestational age. Pain/Pressure: Present     Pelvic:  Cervical exam performed Dilation: 2 Effacement (%): 60 Station: -3  Extremities: Normal range of motion.  Edema: Trace  Mental Status: Normal mood and affect. Normal behavior. Normal judgment and thought content.   Urinalysis: Urine Protein: Trace Urine Glucose: Negative  Assessment and Plan:  Pregnancy: G1P0 at [redacted]w[redacted]d  1. Supervision of high risk  pregnancy, antepartum, third trimester BP elevated today in the severe range. Pt asymptomatic. She reports being told once PRIOR to pregnancy that her BP was elevated.  She had just stopped smoking. She never had elevated BP's again.  Her BP's today wre checked manually.  To L&D for eval of probable IOL     2. Morbid obesity, unspecified obesity type ([place])  3. Hypothyroidism affecting pregnancy  4. GBS (group B Streptococcus carrier), +RV culture, currently pregnant Needs atbx in labor  5. Elevated blood pressure affecting pregnancy, antepartum see above note  Term labor symptoms and general obstetric precautions including but not limited to vaginal bleeding, contractions, leaking of fluid and fetal movement were reviewed in detail with the patient. Please refer to After Visit Summary for other counseling recommendations.  No Follow-up on file.  Lavonia Drafts, MD

## 2016-07-14 NOTE — MAU Note (Signed)
Pt BP is elevated.  Large BP cuff being used.  Pt's has oddly shaped upper arm which causes the BP cuff not to fit properly.  Very snug at top but loose at the bottom.  When cuff inflates, pt tenses up and breathes deeply.  Reported to Dr. Shawna Orleans.  No large manual cuff available.  Trending BP's.

## 2016-07-14 NOTE — Anesthesia Pain Management Evaluation Note (Signed)
  CRNA Pain Management Visit Note  Patient: Jacqueline Peterson, 30 y.o., female  "Hello I am a member of the anesthesia team at [facility]. We have an anesthesia team available at all times to provide care throughout the hospital, including epidural management and anesthesia for C-section. I don't know your plan for the delivery whether it a natural birth, water birth, IV sedation, nitrous supplementation, doula or epidural, but we want to meet your pain goals."   1.Was your pain managed to your expectations on prior hospitalizations?   No prior hospitalizations  2.What is your expectation for pain management during this hospitalization?     Epidural  3.How can we help you reach that goal? unsure  Record the patient's initial score and the patient's pain goal.   Pain: 0  Pain Goal: 5 The [facility] wants you to be able to say your pain was always managed very well.  Casimer Lanius 07/14/2016

## 2016-07-15 ENCOUNTER — Inpatient Hospital Stay (HOSPITAL_COMMUNITY): Payer: 59 | Admitting: Anesthesiology

## 2016-07-15 LAB — COMPREHENSIVE METABOLIC PANEL
ALBUMIN: 2.8 g/dL — AB (ref 3.5–5.0)
ALT: 11 U/L — ABNORMAL LOW (ref 14–54)
AST: 15 U/L (ref 15–41)
Alkaline Phosphatase: 106 U/L (ref 38–126)
Anion gap: 6 (ref 5–15)
BUN: 5 mg/dL — AB (ref 6–20)
CHLORIDE: 105 mmol/L (ref 101–111)
CO2: 24 mmol/L (ref 22–32)
Calcium: 8.2 mg/dL — ABNORMAL LOW (ref 8.9–10.3)
Creatinine, Ser: 0.63 mg/dL (ref 0.44–1.00)
GFR calc Af Amer: >60 mL/min (ref >60)
Glucose, Bld: 119 mg/dL — ABNORMAL HIGH (ref 65–99)
POTASSIUM: 3.5 mmol/L (ref 3.5–5.1)
Sodium: 135 mmol/L (ref 135–145)
Total Bilirubin: 0.3 mg/dL (ref 0.3–1.2)
Total Protein: 6.2 g/dL — ABNORMAL LOW (ref 6.5–8.1)

## 2016-07-15 LAB — CBC
HEMATOCRIT: 29 % — AB (ref 36.0–46.0)
HEMATOCRIT: 30.8 % — AB (ref 36.0–46.0)
HEMOGLOBIN: 10.1 g/dL — AB (ref 12.0–15.0)
HEMOGLOBIN: 10.4 g/dL — AB (ref 12.0–15.0)
MCH: 29.9 pg (ref 26.0–34.0)
MCH: 29.5 pg (ref 26.0–34.0)
MCHC: 34.8 g/dL (ref 30.0–36.0)
MCHC: 33.8 g/dL (ref 30.0–36.0)
MCV: 85.8 fL (ref 78.0–100.0)
MCV: 87.3 fL (ref 78.0–100.0)
Platelets: 258 10*3/uL (ref 150–400)
Platelets: 285 10*3/uL (ref 150–400)
RBC: 3.38 MIL/uL — ABNORMAL LOW (ref 3.87–5.11)
RBC: 3.53 MIL/uL — ABNORMAL LOW (ref 3.87–5.11)
RDW: 14.2 % (ref 11.5–15.5)
RDW: 14.3 % (ref 11.5–15.5)
WBC: 14.7 10*3/uL — AB (ref 4.0–10.5)
WBC: 14.2 10*3/uL — ABNORMAL HIGH (ref 4.0–10.5)

## 2016-07-15 LAB — RPR: RPR Ser Ql: NONREACTIVE

## 2016-07-15 MED ORDER — PHENYLEPHRINE 40 MCG/ML (10ML) SYRINGE FOR IV PUSH (FOR BLOOD PRESSURE SUPPORT): 80.0000 ug | PREFILLED_SYRINGE | INTRAVENOUS | Status: DC | PRN

## 2016-07-15 MED ORDER — FENTANYL 2.5 MCG/ML BUPIVACAINE 1/10 % EPIDURAL INFUSION (WH - ANES)
14.0000 mL/h | INTRAMUSCULAR | Status: DC | PRN
Start: 2016-07-15 — End: 2016-07-16
  Administered 2016-07-15 – 2016-07-16 (×4): 14 mL/h via EPIDURAL
  Filled 2016-07-15: qty 125

## 2016-07-15 MED ORDER — PHENYLEPHRINE 40 MCG/ML (10ML) SYRINGE FOR IV PUSH (FOR BLOOD PRESSURE SUPPORT)
80.0000 ug | PREFILLED_SYRINGE | INTRAVENOUS | Status: DC | PRN
  Filled 2016-07-15 (×2): qty 10

## 2016-07-15 MED ORDER — FENTANYL 2.5 MCG/ML BUPIVACAINE 1/10 % EPIDURAL INFUSION (WH - ANES)
14.0000 mL/h | INTRAMUSCULAR | Status: DC | PRN
  Filled 2016-07-15 (×5): qty 125

## 2016-07-15 MED ORDER — OXYTOCIN 40 UNITS IN LACTATED RINGERS INFUSION - SIMPLE MED
1.0000 m[IU]/min | INTRAVENOUS | Status: DC
  Administered 2016-07-15: 18 m[IU]/min via INTRAVENOUS
  Administered 2016-07-15: 21 m[IU]/min via INTRAVENOUS
  Administered 2016-07-15: 2 m[IU]/min via INTRAVENOUS
  Administered 2016-07-15: 22 m[IU]/min via INTRAVENOUS
  Administered 2016-07-15: 21 m[IU]/min via INTRAVENOUS
  Administered 2016-07-16: 2 m[IU]/min via INTRAVENOUS
  Filled 2016-07-15: qty 1000

## 2016-07-15 MED ORDER — EPHEDRINE 5 MG/ML INJ: 10.0000 mg | INTRAVENOUS | Status: DC | PRN

## 2016-07-15 MED ORDER — LACTATED RINGERS IV SOLN: 500.0000 mL | Freq: Once | INTRAVENOUS | Status: DC

## 2016-07-15 MED ORDER — TERBUTALINE SULFATE 1 MG/ML IJ SOLN: 0.2500 mg | Freq: Once | INTRAMUSCULAR | Status: DC | PRN

## 2016-07-15 MED ORDER — FENTANYL CITRATE (PF) 100 MCG/2ML IJ SOLN: 100.0000 ug | INTRAMUSCULAR | Status: DC | PRN

## 2016-07-15 MED ORDER — PANTOPRAZOLE SODIUM 20 MG PO TBEC
20.0000 mg | DELAYED_RELEASE_TABLET | Freq: Every day | ORAL | Status: DC
  Filled 2016-07-15: qty 1

## 2016-07-15 MED ORDER — DIPHENHYDRAMINE HCL 50 MG/ML IJ SOLN
12.5000 mg | INTRAMUSCULAR | Status: DC | PRN
  Administered 2016-07-16: 12.5 mg via INTRAVENOUS
  Filled 2016-07-15: qty 1

## 2016-07-15 MED ORDER — LIDOCAINE HCL (PF) 1 % IJ SOLN
INTRAMUSCULAR | Status: DC | PRN
  Administered 2016-07-15 (×2): 5 mL

## 2016-07-15 MED ORDER — PANTOPRAZOLE SODIUM 20 MG PO TBEC
20.0000 mg | DELAYED_RELEASE_TABLET | Freq: Every day | ORAL | Status: DC
  Administered 2016-07-15: 20 mg via ORAL
  Filled 2016-07-15 (×3): qty 1

## 2016-07-15 MED ORDER — DIPHENHYDRAMINE HCL 50 MG/ML IJ SOLN
12.5000 mg | INTRAMUSCULAR | Status: DC | PRN
  Administered 2016-07-16 (×2): 12.5 mg via INTRAVENOUS
  Filled 2016-07-15: qty 1

## 2016-07-15 MED ORDER — LACTATED RINGERS IV SOLN
500.0000 mL | Freq: Once | INTRAVENOUS | Status: AC
  Administered 2016-07-15: 250 mL via INTRAVENOUS

## 2016-07-15 MED ORDER — MISOPROSTOL 200 MCG PO TABS
50.0000 ug | ORAL_TABLET | ORAL | Status: DC
  Administered 2016-07-15: 50 ug via ORAL
  Filled 2016-07-15: qty 0.5

## 2016-07-15 NOTE — Progress Notes (Signed)
LABOR PROGRESS NOTE  Jacqueline Peterson is a 30 y.o. G1P0 at [redacted]w[redacted]d admitted for IOL 2/2 pre-E with severe range BP's.   Subjective: Pain well-controlled with epidural. No complaints at this time.  Objective: BP (!) 148/91   Pulse (!) 120   Temp 99 F (37.2 C) (Oral)   Resp 18   Ht 5\' 2"  (1.575 m)   Wt 128.8 kg (284 lb)   LMP 10/10/2015 (Approximate) Comment: within a day or two  SpO2 99%   BMI 51.94 kg/m  or  Vitals:   07/15/16 1923 07/15/16 1935 07/15/16 2005 07/15/16 2032  BP:  (!) 148/80 (!) 147/75 (!) 148/91  Pulse:  (!) 116 (!) 114 (!) 120  Resp: 18  18 18   Temp:   99 F (37.2 C)   TempSrc:   Oral   SpO2:      Weight:      Height:       Dilation: 6 Effacement (%): 80 Cervical Position: Middle Station: -1 Presentation: Vertex Exam by:: erin davis rn FHT: 140 baseline, moderate variability, + accelerations, early decelerations Uterine activity: q2-3 min  Labs: Lab Results  Component Value Date   WBC 14.2 (H) 07/15/2016   HGB 10.4 (L) 07/15/2016   HCT 30.8 (L) 07/15/2016   MCV 87.3 07/15/2016   PLT 285 07/15/2016    Patient Active Problem List   Diagnosis Date Noted  . Severe pre-eclampsia 07/14/2016  . GBS (group B Streptococcus carrier), +RV culture, currently pregnant 06/29/2016  . Elevated blood pressure affecting pregnancy, antepartum 02/11/2016  . Supervision of high risk pregnancy, antepartum 01/07/2016  . Hypothyroidism affecting pregnancy 01/07/2016  . Hypothyroidism 10/04/2015  . Esophageal reflux 10/25/2014  . Palpitations 08/28/2014  . Herpes 08/14/2013  . Routine general medical examination at a health care facility 07/10/2013  . Screen for STD (sexually transmitted disease) 12/23/2012  . Severe obesity (BMI >= 40) ([place]) 03/24/2012  . Anxiety 01/15/2012  . Insomnia 01/15/2012    Assessment / Plan: 30 y.o. G1P0 at [redacted]w[redacted]d here for IOL 2/2 preE with severe range blood pressures.  Labor: Will stop pitocin for a couple hours given slow  labor progression Fetal Wellbeing:  Category I Pain Control:  Epidural Anticipated MOD:  SVD  Jacqueline Peterson, Medical Student  8/30/20179:17 PM    S: Comfortable w/ epidural  O: 148/80, 145/75, other VSS FHR 140s, +accels, no decels Ctx irreg 1.5-4 w/ Pit @ 48mu/min; MVUs just under adequate  A: IUP@30 .6 Protracted active phase  P: Will stop Pit x 2-3 hrs and then restart  Jacqueline Peterson CNM 07/16/2016 2:02 AM

## 2016-07-15 NOTE — Anesthesia Procedure Notes (Signed)
Epidural Patient location during procedure: OB Start time: 07/15/2016 11:01 AM End time: 07/15/2016 11:11 AM  Staffing Anesthesiologist: Reginal Lutes Performed: anesthesiologist   Preanesthetic Checklist Completed: patient identified, site marked, surgical consent, pre-op evaluation, timeout performed, IV checked, risks and benefits discussed and monitors and equipment checked  Epidural Patient position: sitting Prep: site prepped and draped and DuraPrep Patient monitoring: continuous pulse ox and blood pressure Approach: midline Location: L4-L5 Injection technique: LOR air  Needle:  Needle type: Tuohy  Needle gauge: 17 G Needle length: 9 cm and 9 Needle insertion depth: 5 cm cm Catheter type: closed end flexible Catheter size: 19 Gauge Catheter at skin depth: 13 cm Test dose: negative  Assessment Events: blood not aspirated, injection not painful, no injection resistance, negative IV test and no paresthesia  Additional Notes Reason for block:post-op pain management

## 2016-07-15 NOTE — Progress Notes (Signed)
Patient ID: Jacqueline Peterson, female   DOB: 1986-02-29, 30 y.o.   MRN: [id number] LABOR PROGRESS NOTE  Jacqueline Peterson is a 30 y.o. G1P0 at [redacted]w[redacted]d  admitted for IOL 2/2 pre-E with severe range BPs  Subjective: More comfortable with epidural. Amenable to AROM.  Objective: BP (!) 141/70   Pulse 87   Temp 98.2 F (36.8 C) (Oral)   Resp 18   Ht 5\' 2"  (1.575 m)   Wt 128.8 kg (284 lb)   LMP 10/10/2015 (Approximate) Comment: within a day or two  SpO2 99%   BMI 51.94 kg/m  or  Vitals:   07/15/16 1123 07/15/16 1126 07/15/16 1127 07/15/16 1130  BP: (!) 147/74  (!) 141/70 (!) 141/70  Pulse: 88 88 86 87  Resp:    18  Temp:      TempSrc:      SpO2:      Weight:      Height:         Dilation: 4 Effacement (%): 80 Station: -2 Presentation: Vertex Exam by:: [name], RN  5/80/-1 by myself and Dr Vanetta Shawl FHT: 135 baseline, moderate variability, +accelerations, no decelerations Uterine activity: q2-37min  Labs: Lab Results  Component Value Date   WBC 14.2 (H) 07/15/2016   HGB 10.4 (L) 07/15/2016   HCT 30.8 (L) 07/15/2016   MCV 87.3 07/15/2016   PLT 285 07/15/2016    Patient Active Problem List   Diagnosis Date Noted  . Severe pre-eclampsia 07/14/2016  . GBS (group B Streptococcus carrier), +RV culture, currently pregnant 06/29/2016  . Elevated blood pressure affecting pregnancy, antepartum 02/11/2016  . Supervision of high risk pregnancy, antepartum 01/07/2016  . Hypothyroidism affecting pregnancy 01/07/2016  . Hypothyroidism 10/04/2015  . Esophageal reflux 10/25/2014  . Palpitations 08/28/2014  . Herpes 08/14/2013  . Routine general medical examination at a health care facility 07/10/2013  . Screen for STD (sexually transmitted disease) 12/23/2012  . Severe obesity (BMI >= 40) ([place]) 03/24/2012  . Anxiety 01/15/2012  . Insomnia 01/15/2012    Assessment / Plan: 30 y.o. G1P0 at [redacted]w[redacted]d here for IOL 2/2 preE with severe BPs  Labor: now AROM to minimal clear  fluid with bloody show.  IUPC placed  Fetal Wellbeing:  Category I Pain Control:  epidural Anticipated MOD:  SVD  Bufford Lope, DO PGY-1 8/30/201712:00 PM

## 2016-07-15 NOTE — Progress Notes (Addendum)
Pt with history of HSV. Denies any symptoms or prodrome. No lesions on external exam.

## 2016-07-15 NOTE — Anesthesia Preprocedure Evaluation (Signed)
Anesthesia Evaluation  Patient identified by MRN, date of birth, ID band Patient awake    Reviewed: Allergy & Precautions, NPO status , Patient's Chart, lab work & pertinent test results  Airway Mallampati: III  TM Distance: >3 FB Neck ROM: Full    Dental no notable dental hx.    Pulmonary neg pulmonary ROS, former smoker,    Pulmonary exam normal        Cardiovascular hypertension (pre-eclampsia), Normal cardiovascular exam     Neuro/Psych negative neurological ROS  negative psych ROS   GI/Hepatic negative GI ROS, Neg liver ROS, GERD  ,  Endo/Other  negative endocrine ROSHypothyroidism Morbid obesity  Renal/GU negative Renal ROS  negative genitourinary   Musculoskeletal negative musculoskeletal ROS (+)   Abdominal   Peds negative pediatric ROS (+)  Hematology negative hematology ROS (+)   Anesthesia Other Findings   Reproductive/Obstetrics negative OB ROS                             Anesthesia Physical Anesthesia Plan  ASA: III  Anesthesia Plan: Epidural   Post-op Pain Management:    Induction:   Airway Management Planned:   Additional Equipment:   Intra-op Plan:   Post-operative Plan:   Informed Consent:   Plan Discussed with:   Anesthesia Plan Comments:         Anesthesia Quick Evaluation

## 2016-07-15 NOTE — Anesthesia Pain Management Evaluation Note (Addendum)
  CRNA Pain Management Visit Note  Patient: Jacqueline Peterson, 30 y.o., female  "Hello I am a member of the anesthesia team at [facility]. We have an anesthesia team available at all times to provide care throughout the hospital, including epidural management and anesthesia for C-section. I don't know your plan for the delivery whether it a natural birth, water birth, IV sedation, nitrous supplementation, doula or epidural, but we want to meet your pain goals."   1.Was your pain managed to your expectations on prior hospitalizations?   No prior hospitalizations  2.What is your expectation for pain management during this hospitalization? Curently using nitrous supplementation with good results, patient considering natural delivery with nitrous versus Epidural  3.How can we help you reach that goal? Comfort measures and support  Record the patient's initial score and the patient's pain goal.   Pain: 2  Pain Goal: 4 The [facility] wants you to be able to say your pain was always managed very well.  [facility] 07/15/2016

## 2016-07-16 ENCOUNTER — Encounter (HOSPITAL_COMMUNITY)
Admission: AD | Disposition: A | Discharge status: Home / Self Care | Payer: Self-pay | Source: Ambulatory Visit | Attending: Obstetrics and Gynecology

## 2016-07-16 DIAGNOSIS — Z3A39 39 weeks gestation of pregnancy: Secondary | ICD-10-CM

## 2016-07-16 DIAGNOSIS — O1414 Severe pre-eclampsia complicating childbirth: Secondary | ICD-10-CM

## 2016-07-16 DIAGNOSIS — O324XX0 Maternal care for high head at term, not applicable or unspecified: Secondary | ICD-10-CM

## 2016-07-16 DIAGNOSIS — O99824 Streptococcus B carrier state complicating childbirth: Secondary | ICD-10-CM

## 2016-07-16 SURGERY — Surgical Case
Anesthesia: Epidural

## 2016-07-16 MED ORDER — ONDANSETRON HCL 4 MG/2ML IJ SOLN
4.0000 mg | Freq: Three times a day (TID) | INTRAMUSCULAR | Status: DC | PRN
  Administered 2016-07-18: 4 mg via INTRAVENOUS
  Filled 2016-07-16 (×2): qty 2

## 2016-07-16 MED ORDER — SODIUM BICARBONATE 8.4 % IV SOLN
INTRAVENOUS | Status: AC
  Filled 2016-07-16: qty 50

## 2016-07-16 MED ORDER — LABETALOL HCL 5 MG/ML IV SOLN
20.0000 mg | INTRAVENOUS | Status: DC | PRN
Start: 2016-07-16 — End: 2016-07-19
  Administered 2016-07-16 (×2): 20 mg via INTRAVENOUS
  Filled 2016-07-16 (×2): qty 4

## 2016-07-16 MED ORDER — OXYTOCIN 10 UNIT/ML IJ SOLN
INTRAMUSCULAR | Status: DC | PRN
  Administered 2016-07-16: 40 [IU] via INTRAVENOUS

## 2016-07-16 MED ORDER — NALBUPHINE HCL 10 MG/ML IJ SOLN: 5.0000 mg | INTRAMUSCULAR | Status: DC | PRN

## 2016-07-16 MED ORDER — OXYTOCIN 10 UNIT/ML IJ SOLN
INTRAMUSCULAR | Status: AC
  Filled 2016-07-16: qty 4

## 2016-07-16 MED ORDER — NALOXONE HCL 0.4 MG/ML IJ SOLN: 0.4000 mg | INTRAMUSCULAR | Status: DC | PRN

## 2016-07-16 MED ORDER — ONDANSETRON HCL 4 MG/2ML IJ SOLN
INTRAMUSCULAR | Status: DC | PRN
  Administered 2016-07-16: 4 mg via INTRAVENOUS

## 2016-07-16 MED ORDER — LACTATED RINGERS IV SOLN
INTRAVENOUS | Status: DC | PRN
  Administered 2016-07-16: 19:00:00 via INTRAVENOUS

## 2016-07-16 MED ORDER — CHLOROPROCAINE HCL (PF) 3 % IJ SOLN
INTRAMUSCULAR | Status: DC | PRN
  Administered 2016-07-16: 20 mL

## 2016-07-16 MED ORDER — BUPIVACAINE HCL (PF) 0.5 % IJ SOLN: INTRAMUSCULAR | Status: DC | PRN

## 2016-07-16 MED ORDER — LIDOCAINE-EPINEPHRINE (PF) 2 %-1:200000 IJ SOLN
INTRAMUSCULAR | Status: AC
  Filled 2016-07-16: qty 20

## 2016-07-16 MED ORDER — SODIUM CHLORIDE 0.9 % IR SOLN
Status: DC | PRN
  Administered 2016-07-16 (×2): 1

## 2016-07-16 MED ORDER — SODIUM CHLORIDE 0.9% FLUSH: 3.0000 mL | INTRAVENOUS | Status: DC | PRN

## 2016-07-16 MED ORDER — NALBUPHINE HCL 10 MG/ML IJ SOLN: 5.0000 mg | Freq: Once | INTRAMUSCULAR | Status: DC | PRN

## 2016-07-16 MED ORDER — MORPHINE SULFATE (PF) 0.5 MG/ML IJ SOLN
INTRAMUSCULAR | Status: DC | PRN
  Administered 2016-07-16: 4 mg via EPIDURAL

## 2016-07-16 MED ORDER — DIPHENHYDRAMINE HCL 50 MG/ML IJ SOLN: 12.5000 mg | INTRAMUSCULAR | Status: DC | PRN

## 2016-07-16 MED ORDER — LIDOCAINE-EPINEPHRINE (PF) 2 %-1:200000 IJ SOLN
INTRAMUSCULAR | Status: DC | PRN
  Administered 2016-07-16 (×2): 5 mL via INTRADERMAL

## 2016-07-16 MED ORDER — ONDANSETRON HCL 4 MG/2ML IJ SOLN
INTRAMUSCULAR | Status: AC
  Filled 2016-07-16: qty 2

## 2016-07-16 MED ORDER — DIPHENHYDRAMINE HCL 25 MG PO CAPS
25.0000 mg | ORAL_CAPSULE | ORAL | Status: DC | PRN
  Administered 2016-07-16 – 2016-07-17 (×2): 25 mg via ORAL
  Filled 2016-07-16 (×2): qty 1

## 2016-07-16 MED ORDER — KETOROLAC TROMETHAMINE 30 MG/ML IJ SOLN: 30.0000 mg | Freq: Four times a day (QID) | INTRAMUSCULAR | Status: AC | PRN

## 2016-07-16 MED ORDER — DEXTROSE 5 % IV SOLN
INTRAVENOUS | Status: AC
  Filled 2016-07-16: qty 3000

## 2016-07-16 MED ORDER — HYDRALAZINE HCL 20 MG/ML IJ SOLN: 10.0000 mg | Freq: Once | INTRAMUSCULAR | Status: DC | PRN

## 2016-07-16 MED ORDER — PROMETHAZINE HCL 25 MG/ML IJ SOLN: 6.2500 mg | INTRAMUSCULAR | Status: DC | PRN

## 2016-07-16 MED ORDER — CHLOROPROCAINE HCL (PF) 3 % IJ SOLN
INTRAMUSCULAR | Status: AC
  Filled 2016-07-16: qty 20

## 2016-07-16 MED ORDER — NALOXONE HCL 2 MG/2ML IJ SOSY
1.0000 ug/kg/h | PREFILLED_SYRINGE | INTRAVENOUS | Status: DC | PRN
  Filled 2016-07-16: qty 2

## 2016-07-16 MED ORDER — SCOPOLAMINE 1 MG/3DAYS TD PT72
1.0000 | MEDICATED_PATCH | Freq: Once | TRANSDERMAL | Status: DC
  Filled 2016-07-16: qty 1

## 2016-07-16 MED ORDER — BUPIVACAINE HCL (PF) 0.5 % IJ SOLN
INTRAMUSCULAR | Status: AC
  Filled 2016-07-16: qty 30

## 2016-07-16 MED ORDER — SOD CITRATE-CITRIC ACID 500-334 MG/5ML PO SOLN
30.0000 mL | Freq: Once | ORAL | Status: DC
Start: 2016-07-16 — End: 2016-07-17

## 2016-07-16 MED ORDER — MORPHINE SULFATE (PF) 0.5 MG/ML IJ SOLN
INTRAMUSCULAR | Status: AC
  Filled 2016-07-16: qty 10

## 2016-07-16 MED ORDER — DEXTROSE 5 % IV SOLN
3.0000 g | Freq: Once | INTRAVENOUS | Status: DC
  Filled 2016-07-16: qty 3000

## 2016-07-16 MED ORDER — SODIUM BICARBONATE 8.4 % IV SOLN
INTRAVENOUS | Status: DC | PRN
  Administered 2016-07-16: 7 mL via EPIDURAL
  Administered 2016-07-16: 3 mL via EPIDURAL

## 2016-07-16 MED ORDER — FENTANYL CITRATE (PF) 100 MCG/2ML IJ SOLN: 25.0000 ug | INTRAMUSCULAR | Status: DC | PRN

## 2016-07-16 SURGICAL SUPPLY — 39 items
BENZOIN TINCTURE PRP APPL 2/3 (GAUZE/BANDAGES/DRESSINGS) ×2 IMPLANT
CHLORAPREP W/TINT 26ML (MISCELLANEOUS) ×2 IMPLANT
CLAMP CORD UMBIL (MISCELLANEOUS) IMPLANT
CLOSURE STERI STRIP 1/2 X4 (GAUZE/BANDAGES/DRESSINGS) ×2 IMPLANT
CLOTH BEACON ORANGE TIMEOUT ST (SAFETY) ×2 IMPLANT
DRAPE C SECTION CLR SCREEN (DRAPES) ×2 IMPLANT
DRSG OPSITE POSTOP 4X10 (GAUZE/BANDAGES/DRESSINGS) ×2 IMPLANT
ELECT REM PT RETURN 9FT ADLT (ELECTROSURGICAL) ×2
ELECTRODE REM PT RTRN 9FT ADLT (ELECTROSURGICAL) ×1 IMPLANT
EXTRACTOR VACUUM M CUP 4 TUBE (SUCTIONS) IMPLANT
GLOVE BIO SURGEON STRL SZ7.5 (GLOVE) ×2 IMPLANT
GLOVE BIOGEL PI IND STRL 7.0 (GLOVE) ×1 IMPLANT
GLOVE BIOGEL PI INDICATOR 7.0 (GLOVE) ×1
GOWN STRL REUS W/TWL 2XL LVL3 (GOWN DISPOSABLE) ×2 IMPLANT
GOWN STRL REUS W/TWL LRG LVL3 (GOWN DISPOSABLE) ×4 IMPLANT
KIT ABG SYR 3ML LUER SLIP (SYRINGE) IMPLANT
NEEDLE HYPO 22GX1.5 SAFETY (NEEDLE) ×2 IMPLANT
NEEDLE HYPO 25X5/8 SAFETYGLIDE (NEEDLE) IMPLANT
NS IRRIG 1000ML POUR BTL (IV SOLUTION) ×2 IMPLANT
PACK C SECTION WH (CUSTOM PROCEDURE TRAY) ×2 IMPLANT
PAD OB MATERNITY 4.3X12.25 (PERSONAL CARE ITEMS) ×2 IMPLANT
PENCIL SMOKE EVAC W/HOLSTER (ELECTROSURGICAL) ×2 IMPLANT
RTRCTR C-SECT PINK 25CM LRG (MISCELLANEOUS) ×2 IMPLANT
STRIP CLOSURE SKIN 1/2X4 (GAUZE/BANDAGES/DRESSINGS) ×2 IMPLANT
SUT CHROMIC 1 CTX 36 (SUTURE) ×6 IMPLANT
SUT VIC AB 1 CT1 27 (SUTURE) ×2
SUT VIC AB 1 CT1 27XBRD ANTBC (SUTURE) ×2 IMPLANT
SUT VIC AB 2-0 CT1 (SUTURE) ×2 IMPLANT
SUT VIC AB 2-0 CT1 27 (SUTURE) ×1
SUT VIC AB 2-0 CT1 TAPERPNT 27 (SUTURE) ×1 IMPLANT
SUT VIC AB 3-0 CT1 27 (SUTURE) ×2
SUT VIC AB 3-0 CT1 TAPERPNT 27 (SUTURE) ×2 IMPLANT
SUT VIC AB 3-0 SH 27 (SUTURE)
SUT VIC AB 3-0 SH 27X BRD (SUTURE) IMPLANT
SUT VIC AB 4-0 KS 27 (SUTURE) ×2 IMPLANT
SYR BULB IRRIGATION 50ML (SYRINGE) ×2 IMPLANT
SYR CONTROL 10ML LL (SYRINGE) ×2 IMPLANT
TOWEL OR 17X24 6PK STRL BLUE (TOWEL DISPOSABLE) ×2 IMPLANT
TRAY FOLEY CATH SILVER 14FR (SET/KITS/TRAYS/PACK) ×2 IMPLANT

## 2016-07-16 NOTE — Op Note (Signed)
Cesarean Section Procedure Note  07/14/2016 - 07/16/2016  9:32 PM  PATIENT:  Jacqueline Peterson  30 y.o. female  PRE-OPERATIVE DIAGNOSIS:  failure to decend  POST-OPERATIVE DIAGNOSIS:  failure to decend  PROCEDURE:  Procedure(s): CESAREAN SECTION (N/A)  SURGEON:  Surgeon(s) and Role:    * Chancy Milroy, MD - Primary  ASSISTANTS: Ree Shay Mumaw, DO  ANESTHESIA:   epidural  EBL:  Total I/O In: 500 [I.V.:500] Out: 1100 [Urine:300; Blood:800]  BLOOD ADMINISTERED:none  DRAINS: none   LOCAL MEDICATIONS USED:  NONE  SPECIMEN:  No Specimen  DISPOSITION OF SPECIMEN:  N/A   Procedure Details   The patient was seen in the Holding Room. The risks, benefits, complications, treatment options, and expected outcomes were discussed with the patient.  The patient concurred with the proposed plan, giving informed consent.  The site of surgery properly noted/marked. The patient was taken to Operating Room # 9, identified as Jacqueline Peterson and the procedure verified as C-Section Delivery. A Time Out was held and the above information confirmed.  After induction of anesthesia, the patient was draped and prepped in the usual sterile manner. A Pfannenstiel incision was made and carried down through the subcutaneous tissue to the fascia. Fascial incision was made and extended transversely. The fascia was separated from the underlying rectus tissue superiorly and inferiorly. The peritoneum was identified and entered. Peritoneal incision was extended longitudinally. The utero-vesical peritoneal reflection was incised transversely and the bladder flap was bluntly freed from the lower uterine segment. A low transverse uterine incision was made. Delivered from cephalic presentation was a  Female with Apgar scores of 8 at one minute and 8 at five minutes. After the umbilical cord was clamped and cut cord blood was obtained for evaluation. The placenta was removed intact and appeared normal. The  uterine outline, tubes and ovaries appeared normal. The uterine incision was closed with running locked sutures of 1-0 Chromic. Hemostasis was observed. Imbrication of uterine incision was performed with 1-0 Chromic. Bladder flap was closed with 2-0 Chromic. Lavage was carried out until clear. The fascia was then reapproximated with running sutures of 1 Vicryl Subcuticular layer was reapproximated with 2-0 Chromic. The skin was reapproximated with 4.0 Vicryl.  Instrument, sponge, and needle counts were correct prior the abdominal closure and at the conclusion of the case.   Complications:  None; patient tolerated the procedure well.  COUNTS:  YES  PLAN OF CARE: PACU then postpartum for post operative care.  PATIENT DISPOSITION:  PACU - hemodynamically stable.  Condition: stable   [place], [place] Fellow 07/16/2016 9:32 PM

## 2016-07-16 NOTE — Anesthesia Postprocedure Evaluation (Signed)
Anesthesia Post Note  Patient: Jacqueline Peterson  Procedure(s) Performed: Procedure(s) (LRB): CESAREAN SECTION (N/A)  Patient location during evaluation: PACU Anesthesia Type: Spinal Level of consciousness: oriented and awake and alert Pain management: pain level controlled Vital Signs Assessment: post-procedure vital signs reviewed and stable Respiratory status: spontaneous breathing, respiratory function stable and patient connected to nasal cannula oxygen Cardiovascular status: blood pressure returned to baseline and stable Postop Assessment: no headache and no backache Anesthetic complications: no     Last Vitals:  Vitals:   07/16/16 2030 07/16/16 2045  BP: 127/81 103/82  Pulse: 93 (!) 103  Resp: 17 (!) 22  Temp:  36.3 C    Last Pain:  Vitals:   07/16/16 2045  TempSrc: Oral  PainSc: 3    Pain Goal:                 Reginal Lutes

## 2016-07-16 NOTE — Progress Notes (Signed)
Patient ID: Jacqueline Peterson, female   DOB: 25-Dec-1985, 30 y.o.   MRN: [id number] OB Attending Pt feeling some vaginal pressure with ut ctx. SVE 9-9.5/100/+2 Continue with expectant management

## 2016-07-16 NOTE — Transfer of Care (Signed)
Immediate Anesthesia Transfer of Care Note  Patient: Jacqueline Peterson  Procedure(s) Performed: Procedure(s): CESAREAN SECTION (N/A)  Patient Location: PACU  Anesthesia Type:Epidural  Level of Consciousness: sedated and lethargic  Airway & Oxygen Therapy: Patient Spontanous Breathing  Post-op Assessment: Report given to RN  Post vital signs: Reviewed and stable  Last Vitals:  Vitals:   07/16/16 1817 07/16/16 1824  BP: 138/76 (!) 142/71  Pulse: 99 (!) 101  Resp: 20 18  Temp:      Last Pain:  Vitals:   07/16/16 1630  TempSrc: Oral  PainSc:          Complications: No apparent anesthesia complications

## 2016-07-16 NOTE — Progress Notes (Signed)
OB Attending. Pt has made no progress with almost 3 hrs of pushing. C/C/+ 2 with some molding and caput. Management options reviewed with pt and [name]. Following discussion pt desires to proceed to c section.  The risks of cesarean section discussed with the patient included but were not limited to: bleeding which may require transfusion or reoperation; infection which may require antibiotics; injury to bowel, bladder, ureters or other surrounding organs; injury to the fetus; need for additional procedures including hysterectomy in the event of a life-threatening hemorrhage; placental abnormalities wth subsequent pregnancies, incisional problems, thromboembolic phenomenon and other postoperative/anesthesia complications. The patient concurred with the proposed plan, giving informed written consent for the procedure.   Anesthesia and OR aware. Preoperative prophylactic antibiotics and SCDs ordered on call to the OR.  To OR when ready.  Jacqueline Peterson L. Rip Harbour, MD

## 2016-07-16 NOTE — Plan of Care (Signed)
Problem: Life Cycle: Goal: Ability to effectively push during vaginal delivery will improve Outcome: Not Applicable Date Met: 92/92/44 Patient went for primary C/S for arrest of descent.

## 2016-07-17 ENCOUNTER — Encounter (HOSPITAL_COMMUNITY): Payer: Self-pay

## 2016-07-17 LAB — CBC
HCT: 27.3 % — ABNORMAL LOW (ref 36.0–46.0)
Hemoglobin: 9.4 g/dL — ABNORMAL LOW (ref 12.0–15.0)
MCH: 30 pg (ref 26.0–34.0)
MCHC: 34.4 g/dL (ref 30.0–36.0)
MCV: 87.2 fL (ref 78.0–100.0)
Platelets: 298 10*3/uL (ref 150–400)
RBC: 3.13 MIL/uL — ABNORMAL LOW (ref 3.87–5.11)
RDW: 14.7 % (ref 11.5–15.5)
WBC: 20.6 10*3/uL — ABNORMAL HIGH (ref 4.0–10.5)

## 2016-07-17 LAB — BIRTH TISSUE RECOVERY COLLECTION (PLACENTA DONATION)

## 2016-07-17 MED ORDER — OXYCODONE-ACETAMINOPHEN 5-325 MG PO TABS
1.0000 | ORAL_TABLET | ORAL | Status: DC | PRN
  Administered 2016-07-18 – 2016-07-19 (×5): 1 via ORAL
  Filled 2016-07-17 (×5): qty 1

## 2016-07-17 MED ORDER — DIPHENHYDRAMINE HCL 25 MG PO CAPS: 25.0000 mg | ORAL_CAPSULE | Freq: Four times a day (QID) | ORAL | Status: DC | PRN

## 2016-07-17 MED ORDER — IBUPROFEN 600 MG PO TABS
600.0000 mg | ORAL_TABLET | Freq: Four times a day (QID) | ORAL | Status: DC
  Administered 2016-07-17 – 2016-07-19 (×9): 600 mg via ORAL
  Filled 2016-07-17 (×9): qty 1

## 2016-07-17 MED ORDER — TETANUS-DIPHTH-ACELL PERTUSSIS 5-2.5-18.5 LF-MCG/0.5 IM SUSP: 0.5000 mL | Freq: Once | INTRAMUSCULAR | Status: DC

## 2016-07-17 MED ORDER — ZOLPIDEM TARTRATE 5 MG PO TABS: 5.0000 mg | ORAL_TABLET | Freq: Every evening | ORAL | Status: DC | PRN

## 2016-07-17 MED ORDER — WITCH HAZEL-GLYCERIN EX PADS: 1.0000 "application " | MEDICATED_PAD | CUTANEOUS | Status: DC | PRN

## 2016-07-17 MED ORDER — MEPERIDINE HCL 25 MG/ML IJ SOLN: 6.2500 mg | INTRAMUSCULAR | Status: DC | PRN

## 2016-07-17 MED ORDER — SIMETHICONE 80 MG PO CHEW
80.0000 mg | CHEWABLE_TABLET | Freq: Three times a day (TID) | ORAL | Status: DC
  Administered 2016-07-17 – 2016-07-19 (×6): 80 mg via ORAL
  Filled 2016-07-17 (×6): qty 1

## 2016-07-17 MED ORDER — SIMETHICONE 80 MG PO CHEW: 80.0000 mg | CHEWABLE_TABLET | ORAL | Status: DC | PRN

## 2016-07-17 MED ORDER — SIMETHICONE 80 MG PO CHEW
80.0000 mg | CHEWABLE_TABLET | ORAL | Status: DC
  Administered 2016-07-17 – 2016-07-19 (×3): 80 mg via ORAL
  Filled 2016-07-17 (×3): qty 1

## 2016-07-17 MED ORDER — LACTATED RINGERS IV SOLN
INTRAVENOUS | Status: DC
  Administered 2016-07-17: 08:00:00 via INTRAVENOUS

## 2016-07-17 MED ORDER — DIBUCAINE 1 % RE OINT: 1.0000 "application " | TOPICAL_OINTMENT | RECTAL | Status: DC | PRN

## 2016-07-17 MED ORDER — COCONUT OIL OIL: 1.0000 "application " | TOPICAL_OIL | Status: DC | PRN

## 2016-07-17 MED ORDER — ACETAMINOPHEN 325 MG PO TABS
650.0000 mg | ORAL_TABLET | ORAL | Status: DC | PRN
  Administered 2016-07-17 (×2): 650 mg via ORAL
  Filled 2016-07-17 (×2): qty 2

## 2016-07-17 MED ORDER — MENTHOL 3 MG MT LOZG: 1.0000 | LOZENGE | OROMUCOSAL | Status: DC | PRN

## 2016-07-17 MED ORDER — OXYCODONE-ACETAMINOPHEN 5-325 MG PO TABS: 2.0000 | ORAL_TABLET | ORAL | Status: DC | PRN

## 2016-07-17 MED ORDER — OXYTOCIN 40 UNITS IN LACTATED RINGERS INFUSION - SIMPLE MED: 2.5000 [IU]/h | INTRAVENOUS | Status: AC

## 2016-07-17 MED ORDER — PRENATAL MULTIVITAMIN CH
1.0000 | ORAL_TABLET | Freq: Every day | ORAL | Status: DC
  Administered 2016-07-17 – 2016-07-19 (×3): 1 via ORAL
  Filled 2016-07-17 (×3): qty 1

## 2016-07-17 MED ORDER — SENNOSIDES-DOCUSATE SODIUM 8.6-50 MG PO TABS
2.0000 | ORAL_TABLET | ORAL | Status: DC
  Administered 2016-07-17 – 2016-07-19 (×3): 2 via ORAL
  Filled 2016-07-17 (×3): qty 2

## 2016-07-17 NOTE — Progress Notes (Signed)
POD # 1 Pt without complaints this morning. She denies any HA or blurry vision.  Pain controlled. Tolerating diet.  PE AF BP 120-130's/70-80's Lungs clear Heart RRR Abd soft + BS uterus firm , nl lochica, incision drsg intact Ext SCD's, +2 edema, DTR's + 2 , no clonus  A/P POD # 1 LTCS        Severe Preeclampsia  Stable. Will d/c magnesium after 24 hrs this evening. Continue to monitor BP. Continue with progressive care.

## 2016-07-17 NOTE — Progress Notes (Signed)
Subjective: Postpartum Day 1: Cesarean Delivery Patient reports incisional pain, tolerating PO, + flatus and no problems voiding.    Objective: Vital signs in last 24 hours: Temp:  [97.4 F (36.3 C)-99.1 F (37.3 C)] 98.5 F (36.9 C) (09/01 0605) Pulse Rate:  [80-121] 87 (09/01 0700) Resp:  [15-24] 18 (09/01 0605) BP: (103-177)/(60-137) 137/65 (09/01 0700) SpO2:  [90 %-100 %] 97 % (09/01 0700) Weight:  [135.2 kg (298 lb)] 135.2 kg (298 lb) (09/01 [id number])  Physical Exam:  General: alert, cooperative and no distress Lochia: appropriate Uterine Fundus: firm Incision: healing well, no significant drainage, no dehiscence, no significant erythema DVT Evaluation: No evidence of DVT seen on physical exam, SCDs in place bilaterally.   Recent Labs  07/15/16 0844 07/17/16 0609  HGB 10.4* 9.4*  HCT 30.8* 27.3*    Assessment/Plan: Status post Cesarean section. Doing well postoperatively. All blood pressures in normotensive range over past 24 hours.  Continue current care.  Lorenza Evangelist 07/17/2016, 7:44 AM

## 2016-07-17 NOTE — Anesthesia Postprocedure Evaluation (Signed)
Anesthesia Post Note  Patient: ANORAH CROSSWHITE  Procedure(s) Performed: Procedure(s) (LRB): CESAREAN SECTION (N/A)  Patient location during evaluation: Mother Baby Anesthesia Type: Epidural Level of consciousness: awake and alert and oriented Pain management: pain level controlled Vital Signs Assessment: post-procedure vital signs reviewed and stable Respiratory status: spontaneous breathing and nonlabored ventilation Cardiovascular status: stable Postop Assessment: no headache, no backache, patient able to bend at knees, no signs of nausea or vomiting and adequate PO intake Anesthetic complications: no     Last Vitals:  Vitals:   07/17/16 0605 07/17/16 0700  BP: 132/65 137/65  Pulse: 98 87  Resp: 18   Temp: 36.9 C     Last Pain:  Vitals:   07/17/16 0732  TempSrc:   PainSc: 0-No pain   Pain Goal: Patients Stated Pain Goal: 3 (07/17/16 0530)               Willa Rough

## 2016-07-17 NOTE — Addendum Note (Signed)
Addendum  created 07/17/16 [id number] by Jonna Munro, CRNA   Sign clinical note

## 2016-07-17 NOTE — Progress Notes (Signed)
[name] was referred for history of depression/anxiety.  Referral is screened out by Clinical Social Worker because none of the following criteria appear to apply and  there are no reports impacting the pregnancy or her transition to the postpartum period. CSW does not deem it clinically necessary to further investigate at this time.  -History of anxiety/depression during this pregnancy, or of post-partum depression.  - Diagnosis of anxiety and/or depression within last 3 years.-  Dx 2013, history of medication (Xanax)  - History of depression due to pregnancy loss/loss of child or -[name]'s symptoms are currently being treated with medication and/or therapy.  Please contact the Clinical Social Worker if needs arise or upon [name] request.    Lane Hacker, MSW Clinical Social Work: System Print production planner for [employer] social worker [phone]

## 2016-07-17 NOTE — Lactation Note (Addendum)
This note was copied from a baby's chart. Lactation Consultation Note  Patient Name: Jacqueline Peterson [id number] Date: 07/17/2016 Reason for consult: Initial assessment;Difficult latch Baby 7 hours old. Mom is obese and hypothyroid with widely spaced, v-shaped breast. Enc mom to make sure her thyroid levels are checked and discussed importance of hormone balance to breast milk production. Mom reports that she has had baby latched to both breasts, but now baby is sleepy at breast. Assisted mom to latch baby to right breast in football position. Baby latched deeply after using hand pump to evert mom's nipple. However, no colostrum present with hand expression. Baby suckled off-and-on for about 3 minutes and then fell asleep. Enc parents to put baby to breast with cues, using manual pump to evert nipple. Enc parents to supplement after BF according to supplementation guidelines which parents have at bedside. Enc mom to postpump after BF. Mom has personal DEBP in room, but enc to keep using hospital-grade pump for now.   Discussed assessment and interventions with patient's bedside nurse, Larene Beach, RN.  Maternal Data Has patient been taught Hand Expression?: Yes Does the patient have breastfeeding experience prior to this delivery?: No  Feeding Feeding Type: Breast Fed Nipple Type: Slow - flow Length of feed: 3 min  LATCH Score/Interventions Latch: Repeated attempts needed to sustain latch, nipple held in mouth throughout feeding, stimulation needed to elicit sucking reflex. Intervention(s): Skin to skin;Teach feeding cues;Waking techniques Intervention(s): Adjust position;Assist with latch;Breast compression  Audible Swallowing: None Intervention(s): Skin to skin;Hand expression  Type of Nipple: Everted at rest and after stimulation (Nipples are tough and not easily compressible.) Intervention(s): Hand pump;Double electric pump  Comfort (Breast/Nipple): Soft / non-tender     Hold  (Positioning): Assistance needed to correctly position infant at breast and maintain latch. Intervention(s): Breastfeeding basics reviewed;Support Pillows;Position options;Skin to skin  LATCH Score: 6  Lactation Tools Discussed/Used     Consult Status Consult Status: Follow-up Date: 07/18/16 Follow-up type: In-patient    Andres Labrum 07/17/2016, 3:01 PM

## 2016-07-18 MED ORDER — AMLODIPINE BESYLATE 10 MG PO TABS
10.0000 mg | ORAL_TABLET | Freq: Every day | ORAL | Status: DC
  Administered 2016-07-18 – 2016-07-19 (×2): 10 mg via ORAL
  Filled 2016-07-18 (×3): qty 1

## 2016-07-18 MED ORDER — PANTOPRAZOLE SODIUM 40 MG PO TBEC
40.0000 mg | DELAYED_RELEASE_TABLET | Freq: Every day | ORAL | Status: DC
  Administered 2016-07-18 – 2016-07-19 (×2): 40 mg via ORAL
  Filled 2016-07-18 (×2): qty 1

## 2016-07-18 MED ORDER — PANTOPRAZOLE SODIUM 40 MG PO TBEC: 40.0000 mg | DELAYED_RELEASE_TABLET | Freq: Every day | ORAL | 0 refills | Status: DC

## 2016-07-18 MED ORDER — OXYCODONE-ACETAMINOPHEN 5-325 MG PO TABS: 1.0000 | ORAL_TABLET | ORAL | 0 refills | Status: DC | PRN

## 2016-07-18 MED ORDER — AMLODIPINE BESYLATE 10 MG PO TABS: 10.0000 mg | ORAL_TABLET | Freq: Every day | ORAL | 0 refills | Status: DC

## 2016-07-18 NOTE — Discharge Summary (Addendum)
OB Discharge Summary     Patient Name: Jacqueline Peterson DOB: Mar 18, 1986 MRN: [id number]  Date of admission: 07/14/2016 Delivering MD: Katherine Basset [facility]   Date of discharge: 07/18/2016  Admitting diagnosis: 39wks elevated bp at doctors office Intrauterine pregnancy: [redacted]w[redacted]d     Secondary diagnosis:  Active Problems:   Severe pre-eclampsia  Additional problems:      Discharge diagnosis: CHTN with superimposed preeclampsia                                                                                                Post partum procedures:mag sulfate x 24 hours  Augmentation: Pitocin, Cytotec and Foley Balloon  Complications: None  Hospital course:  Induction of Labor With Cesarean Section  30 y.o. yo G1P0 at [redacted]w[redacted]d was admitted to the hospital 07/14/2016 for induction of labor. Patient had a labor course significant for Severe range BPs with negative pre-eclampsia lab and ROS w/u. Severe gHTN vs cHTN but given BPs, will start Mg. No prodromal s/s and negative EGBUS exam for HSV, per fellow. Will start PCN for GBS pos per departmental protocol. . The patient went for cesarean section due to Arrest of Descent, and delivered a Viable infant,@BABYSUPPRESS (DBLINK,ept,110,,1,,) Membrane Rupture Time/Date: )12:02 PM ,07/15/2016   @Details  of operation can be found in separate operative Note.  Patient had an uncomplicated postpartum course. She is ambulating, tolerating a regular diet, passing flatus, and urinating well.  Patient is discharged home in stable condition on 07/18/16.                                  CBC Latest Ref Rng & Units 07/17/2016 07/15/2016 07/15/2016  WBC 4.0 - 10.5 K/uL 20.6(H) 14.2(H) 14.7(H)  Hemoglobin 12.0 - 15.0 g/dL 9.4(L) 10.4(L) 10.1(L)  Hematocrit 36.0 - 46.0 % 27.3(L) 30.8(L) 29.0(L)  Platelets 150 - 400 K/uL 298 285 258      Physical exam Vitals:   07/17/16 2155 07/18/16 0148 07/18/16 0300 07/18/16 0512  BP: (!) 147/90 (!) 141/67  (!) 159/81  Pulse:  93 92  95  Resp: 18 18 18 18   Temp: 98.4 F (36.9 C) 98.6 F (37 C)  98.1 F (36.7 C)  TempSrc: Oral Oral    SpO2: 96% 95%  99%  Weight:    277 lb (125.6 kg)  Height:       General: alert, cooperative and no distress Lochia: appropriate Uterine Fundus: firm Incision: Healing well with no significant drainage, No significant erythema, Dressing is clean, dry, and intact DVT Evaluation: No evidence of DVT seen on physical exam. Labs: Lab Results  Component Value Date   WBC 20.6 (H) 07/17/2016   HGB 9.4 (L) 07/17/2016   HCT 27.3 (L) 07/17/2016   MCV 87.2 07/17/2016   PLT 298 07/17/2016   CMP Latest Ref Rng & Units 07/15/2016  Glucose 65 - 99 mg/dL 119(H)  BUN 6 - 20 mg/dL 5(L)  Creatinine 0.44 - 1.00 mg/dL 0.63  Sodium 135 - 145 mmol/L 135  Potassium 3.5 - 5.1 mmol/L  3.5  Chloride 101 - 111 mmol/L 105  CO2 22 - 32 mmol/L 24  Calcium 8.9 - 10.3 mg/dL 8.2(L)  Total Protein 6.5 - 8.1 g/dL 6.2(L)  Total Bilirubin 0.3 - 1.2 mg/dL 0.3  Alkaline Phos 38 - 126 U/L 106  AST 15 - 41 U/L 15  ALT 14 - 54 U/L 11(L)    Discharge instruction: per After Visit Summary and "Baby and Me Booklet".  After visit meds:    Medication List    TAKE these medications   acetaminophen 325 MG tablet Commonly known as:  TYLENOL Take 325 mg by mouth every 6 (six) hours as needed for moderate pain.   levothyroxine 75 MCG tablet Commonly known as:  SYNTHROID, LEVOTHROID TAKE 1 TABLET (75 MCG TOTAL) BY MOUTH DAILY.   oxyCODONE-acetaminophen 5-325 MG tablet Commonly known as:  PERCOCET/ROXICET Take 1 tablet by mouth every 4 (four) hours as needed (pain scale 4-7).   pantoprazole 40 MG tablet Commonly known as:  PROTONIX Take 1 tablet (40 mg total) by mouth 2 (two) times daily. What changed:  when to take this   prenatal multivitamin Tabs tablet Take 1 tablet by mouth daily at 12 noon.   valACYclovir 1000 MG tablet Commonly known as:  VALTREX Take 1 tablet (1,000 mg total) by mouth 2  (two) times daily. What changed:  when to take this   ZYRTEC PO Take 1 tablet by mouth daily.     Norvasc 10mg  daily to be added to d/c meds x 30 days  Diet: routine diet  Activity: Advance as tolerated. Pelvic rest for 6 weeks.   Outpatient follow up:6 weeks Follow up Appt:No future appointments. Follow up Visit:No Follow-up on file.  Postpartum contraception: Nexplanon  Newborn Data: Live born female  Birth Weight: 8 lb 6 oz (3800 g) APGAR: 8, 8  Baby Feeding: Breast Disposition:home with mother   07/18/2016 Jonnie Kind, MD

## 2016-07-18 NOTE — Progress Notes (Signed)
Subjective: Postpartum Day 2: Cesarean Delivery Patient reports tolerating PO, + flatus and no problems voiding.   Breast feeding, supplementing  BC-Nexplanon Objective: Vital signs in last 24 hours: Temp:  [98 F (36.7 C)-98.7 F (37.1 C)] 98.1 F (36.7 C) (09/02 0512) Pulse Rate:  [90-95] 95 (09/02 0512) Resp:  [14-20] 18 (09/02 0512) BP: (112-159)/(58-90) 159/81 (09/02 0512) SpO2:  [95 %-99 %] 99 % (09/02 0512) Weight:  [277 lb (125.6 kg)] 277 lb (125.6 kg) (09/02 0512)  Physical Exam:  General: alert, cooperative, no distress and morbidly obese Lochia: appropriate Uterine Fundus: difficult to assess Incision: healing well, no significant drainage, no dehiscence, dressing intact,  DVT Evaluation: No evidence of DVT seen on physical exam.   Recent Labs  07/15/16 0844 07/17/16 0609  HGB 10.4* 9.4*  HCT 30.8* 27.3*    Assessment/Plan: Status post Cesarean section. Doing well postoperatively.  Discharge home with standard precautions and return to clinic in 4-6 weeks, Nexplanon postpartum 4-6 wk .  Jacqueline Peterson V 07/18/2016, 8:10 AM

## 2016-07-18 NOTE — Lactation Note (Signed)
This note was copied from a baby's chart. Lactation Consultation Note  Patient Name: Jacqueline Peterson [id number] Date: 07/18/2016 Reason for consult: Follow-up assessment;Difficult latch   Follow up with mom of 79 hour old infant. Infant with 3 BF attempts 6 bottle feeds of 5-15 cc, 8 voids and 6 stools in last 24 hours prior to this assessment. Infant weight 8 lb 0.6 oz with weight loss of 4% since birth. LATCH Scores 6-8 by bedside RN's. Mom's history significant for hypothyroid and wide spaced breasts.  Mom reports BF is not going well, infant sleepy at the breast and pulls off frequently. Infant returned to room from hearing screen and was cueing to feed. Spoke with mom about supplementing infant while at breast, mom agreed to try 5 fr feeding tube at breast. Placed 5 fr feeding tube to right breast and latched infant to right breast in cross cradle hold, she latched briefly and then pulled off. We then placed her to the right breast in the football hold. She latched and fed for about 10 minutes with a few swallow heard. Mom's nipple was pinched when infant came off. Mom denied pain with feeding. Infant took 1 cc via 5 fr feeding tube, she was bottle fed 20 cc Alimentum post BF and tolerated it well. We were unable to express colostrum from breasts at this time.    Enc mom to continue feeding infant at breast 8-12 x in 24 hours at first feeding cues using 5 fr feeding tube, Follow BF with supplementation of formula via bottle if not taken through SNS, Pump post BF for 15 minutes on initiate setting every 2-3 hours followed by hand expression.   Reviewed all BF information in Taking Care of Baby and Me Booklet. Reviewed Engorgement prevention/treatment, I/O, Supplementation by guidelines. Reviewed LC Brochure, mom aware of OP Services, BF Support Groups and [place] phone #. Spoke with mom about using hospital grade electric breast pump, mom agreed to 2 week rental. She has a Free Style to use later.    OP Appt made for Thursday July 23, 2016. Infant and mom to stay another day to work on feedings. Enc mom to call with questions/concerns prn.      Maternal Data Formula Feeding for Exclusion: No Has patient been taught Hand Expression?: Yes Does the patient have breastfeeding experience prior to this delivery?: No  Feeding Feeding Type: Breast Fed Nipple Type: Slow - flow Length of feed: 10 min  LATCH Score/Interventions Latch: Repeated attempts needed to sustain latch, nipple held in mouth throughout feeding, stimulation needed to elicit sucking reflex. Intervention(s): Skin to skin;Teach feeding cues Intervention(s): Adjust position;Assist with latch;Breast massage;Breast compression  Audible Swallowing: A few with stimulation Intervention(s): Alternate breast massage  Type of Nipple: Everted at rest and after stimulation Intervention(s): Hand pump  Comfort (Breast/Nipple): Soft / non-tender     Hold (Positioning): Assistance needed to correctly position infant at breast and maintain latch. Intervention(s): Breastfeeding basics reviewed;Support Pillows;Position options;Skin to skin  LATCH Score: 7  Lactation Tools Discussed/Used Tools: 4F feeding tube / Syringe WIC Program: No Pump Review: Setup, frequency, and cleaning;Milk Storage Initiated by:: Reviewed   Consult Status Consult Status: Follow-up Date: 07/19/16 Follow-up type: In-patient    Debby Freiberg Nahal Wanless 07/18/2016, 11:37 AM

## 2016-07-19 NOTE — Lactation Note (Signed)
This note was copied from a baby's chart. Lactation Consultation Note  Patient Name: Jacqueline Peterson [id number] Date: 07/19/2016 Reason for consult: Follow-up assessment   Follow up with mother. Mom reports she does not wish to bottle feed or pump. Advised her to use ice packs, supportive bra, cabbage leaves and Motrin to assist in drying up her milk if she is uncomfortable. Mom voiced understanding. She asked to cancel her OP appointment set for 9/7.   Maternal Data    Feeding Feeding Type: Bottle Fed - Formula Nipple Type: Slow - flow  LATCH Score/Interventions                      Lactation Tools Discussed/Used     Consult Status Consult Status: Complete Follow-up type: Call as needed    Donn Pierini 07/19/2016, 11:21 AM

## 2016-07-19 NOTE — Progress Notes (Signed)
Discharge instructions provided to patient and significant other at bedside.,  Acivity, medications, incision/dressing care,  when to call the doctor, follow up appointments and community resources discussed.  No questions at this time.  Patient left unit in stable condition with all personal belongings and prescriptions accompanied by staff.  Leighton Roach, RN--------

## 2016-07-19 NOTE — Discharge Summary (Signed)
OB Discharge Summary  Patient Name: Jacqueline Peterson DOB: 1986-07-27 MRN: [id number]  Date of admission: 07/14/2016 Delivering MD: Katherine Basset [facility]   Date of discharge: 07/19/2016  Admitting diagnosis: 39wks elevated bp at doctors office Intrauterine pregnancy: [redacted]w[redacted]d     Secondary diagnosis:Active Problems:   Severe pre-eclampsia  Additional problems:n/a     Discharge diagnosis: Term Pregnancy Delivered and Preeclampsia (severe)                                                                     Post partum procedures:n/a  Augmentation: Pitocin  Complications: None  Hospital course:  Induction of Labor With Cesarean Section  30 y.o. yo G1P0 at [redacted]w[redacted]d was admitted to the hospital 07/14/2016 for induction of labor. Patient had a labor course significant for severe preeclampsia. The patient went for cesarean section due to Arrest of Descent, and delivered a Viable infant,@BABYSUPPRESS (DBLINK,ept,110,,1,,) Membrane Rupture Time/Date: )12:02 PM ,07/15/2016   @Details  of operation can be found in separate operative Note.  Patient had an uncomplicated postpartum course. She is ambulating, tolerating a regular diet, passing flatus, and urinating well.  Patient is discharged home in stable condition on 07/19/16.                                     Physical exam Vitals:   07/18/16 1825 07/19/16 0013 07/19/16 0256 07/19/16 0542  BP: (!) 143/77 (!) 150/88 (!) 143/75 124/73  Pulse: (!) 107 98 93 88  Resp: 18 20 18 16   Temp: 98.4 F (36.9 C) 98.4 F (36.9 C) 98.4 F (36.9 C) 98.5 F (36.9 C)  TempSrc: Oral Oral Oral Oral  SpO2: 97% 98% 97% 97%  Weight:    124.3 kg (274 lb)  Height:       General: alert, cooperative and no distress Lochia: appropriate Uterine Fundus: firm Incision: Healing well with no significant drainage, No significant erythema DVT Evaluation: No evidence of DVT seen on physical exam. Negative Homan's sign. No cords or calf tenderness. No  significant calf/ankle edema. Labs: Lab Results  Component Value Date   WBC 20.6 (H) 07/17/2016   HGB 9.4 (L) 07/17/2016   HCT 27.3 (L) 07/17/2016   MCV 87.2 07/17/2016   PLT 298 07/17/2016   CMP Latest Ref Rng & Units 07/15/2016  Glucose 65 - 99 mg/dL 119(H)  BUN 6 - 20 mg/dL 5(L)  Creatinine 0.44 - 1.00 mg/dL 0.63  Sodium 135 - 145 mmol/L 135  Potassium 3.5 - 5.1 mmol/L 3.5  Chloride 101 - 111 mmol/L 105  CO2 22 - 32 mmol/L 24  Calcium 8.9 - 10.3 mg/dL 8.2(L)  Total Protein 6.5 - 8.1 g/dL 6.2(L)  Total Bilirubin 0.3 - 1.2 mg/dL 0.3  Alkaline Phos 38 - 126 U/L 106  AST 15 - 41 U/L 15  ALT 14 - 54 U/L 11(L)    Discharge instruction: per After Visit Summary and "Baby and Me Booklet".  After Visit Meds:    Medication List    TAKE these medications   acetaminophen 325 MG tablet Commonly known as:  TYLENOL Take 325 mg by mouth every 6 (six) hours as needed  for moderate pain.   amLODipine 10 MG tablet Commonly known as:  NORVASC Take 1 tablet (10 mg total) by mouth daily.   levothyroxine 75 MCG tablet Commonly known as:  SYNTHROID, LEVOTHROID TAKE 1 TABLET (75 MCG TOTAL) BY MOUTH DAILY.   oxyCODONE-acetaminophen 5-325 MG tablet Commonly known as:  PERCOCET/ROXICET Take 1 tablet by mouth every 4 (four) hours as needed (pain scale 4-7).   pantoprazole 40 MG tablet Commonly known as:  PROTONIX Take 1 tablet (40 mg total) by mouth 2 (two) times daily. What changed:  when to take this   pantoprazole 40 MG tablet Commonly known as:  PROTONIX Take 1 tablet (40 mg total) by mouth daily. What changed:  You were already taking a medication with the same name, and this prescription was added. Make sure you understand how and when to take each.   prenatal multivitamin Tabs tablet Take 1 tablet by mouth daily at 12 noon.   valACYclovir 1000 MG tablet Commonly known as:  VALTREX Take 1 tablet (1,000 mg total) by mouth 2 (two) times daily. What changed:  when to take  this   ZYRTEC PO Take 1 tablet by mouth daily.       Diet: routine diet  Activity: Advance as tolerated. Pelvic rest for 6 weeks.   Outpatient follow up:6 weeks Follow up Appt:Future Appointments Date Time Provider [place]  07/23/2016 9:00 AM [place] None   Follow up visit: No Follow-up on file.  Postpartum contraception: IUD Mirena  Newborn Data: Live born female  Birth Weight: 8 lb 6 oz (3800 g) APGAR: 8, 8  Baby Feeding: Bottle Disposition:home with mother   07/19/2016 Oletha Blend, Student-MidWife

## 2016-07-20 ENCOUNTER — Encounter (HOSPITAL_COMMUNITY): Payer: Self-pay | Admitting: Obstetrics and Gynecology

## 2016-07-21 MED FILL — AMLODIPINE BESYLATE 10 MG T: 10 | 30 days supply | Qty: 30 | Fill #0

## 2016-07-23 ENCOUNTER — Inpatient Hospital Stay (HOSPITAL_COMMUNITY): Admit: 2016-07-23 | Payer: 59

## 2016-07-23 ENCOUNTER — Ambulatory Visit (INDEPENDENT_AMBULATORY_CARE_PROVIDER_SITE_OTHER): Payer: 59 | Admitting: Obstetrics & Gynecology

## 2016-07-23 ENCOUNTER — Encounter: Payer: Self-pay | Admitting: Obstetrics & Gynecology

## 2016-07-23 VITALS — BP 128/86 | Ht 62.0 in | Wt 263.0 lb

## 2016-07-23 DIAGNOSIS — Z4889 Encounter for other specified surgical aftercare: Secondary | ICD-10-CM

## 2016-07-23 MED ORDER — OXYCODONE-ACETAMINOPHEN 5-325 MG PO TABS: 1.0000 | ORAL_TABLET | ORAL | 0 refills | Status: DC | PRN

## 2016-07-23 NOTE — Progress Notes (Signed)
   Subjective:    Patient ID: Jacqueline Peterson, female    DOB: 08/10/1986, 30 y.o.   MRN: [id number]  HPI  Pt here for wound check. No complaints.  Using percocet at night and needs refill.   Review of Systems  Constitutional: Negative.   Cardiovascular: Negative.   Gastrointestinal: Negative.   Genitourinary: Negative.        Objective:   Physical Exam  Constitutional: She is oriented to person, place, and time. She appears well-developed and well-nourished. No distress.  HENT:  Head: Normocephalic and atraumatic.  Eyes: Conjunctivae are normal.  Pulmonary/Chest: Effort normal.  Abdominal: Soft. Bowel sounds are normal. She exhibits no distension and no mass. There is no tenderness. There is no rebound and no guarding.  Musculoskeletal: She exhibits no edema.  Neurological: She is alert and oriented to person, place, and time.  Skin: Skin is warm and dry.  Psychiatric: She has a normal mood and affect.  Vitals reviewed.  Assessment & Plan:  30 yo female s/p c/s for failure to descend.  1-wound healing well  Keep clean and dry 2-Percocet refill x1 3-RTC 5 weeks.

## 2016-07-29 ENCOUNTER — Encounter: Payer: Self-pay | Admitting: Obstetrics & Gynecology

## 2016-08-11 ENCOUNTER — Encounter: Payer: Self-pay | Admitting: Obstetrics & Gynecology

## 2016-08-11 ENCOUNTER — Other Ambulatory Visit: Payer: Self-pay | Admitting: *Deleted

## 2016-08-11 ENCOUNTER — Other Ambulatory Visit (HOSPITAL_COMMUNITY): Payer: Self-pay | Admitting: Obstetrics and Gynecology

## 2016-08-12 ENCOUNTER — Other Ambulatory Visit: Payer: Self-pay | Admitting: *Deleted

## 2016-08-12 DIAGNOSIS — R030 Elevated blood-pressure reading, without diagnosis of hypertension: Principal | ICD-10-CM

## 2016-08-12 DIAGNOSIS — IMO0001 Reserved for inherently not codable concepts without codable children: Secondary | ICD-10-CM

## 2016-08-12 MED ORDER — AMLODIPINE BESYLATE 10 MG PO TABS: 10.0000 mg | ORAL_TABLET | Freq: Every day | ORAL | 0 refills | Status: DC

## 2016-08-12 MED FILL — AMLODIPINE BESYLATE 10 MG T: 10 | 30 days supply | Qty: 30 | Fill #0

## 2016-08-27 ENCOUNTER — Encounter: Payer: Self-pay | Admitting: Obstetrics & Gynecology

## 2016-08-27 ENCOUNTER — Ambulatory Visit (INDEPENDENT_AMBULATORY_CARE_PROVIDER_SITE_OTHER): Payer: 59 | Admitting: Obstetrics & Gynecology

## 2016-08-27 DIAGNOSIS — Z3202 Encounter for pregnancy test, result negative: Secondary | ICD-10-CM | POA: Diagnosis not present

## 2016-08-27 LAB — POCT URINE PREGNANCY: Preg Test, Ur: NEGATIVE

## 2016-08-27 MED ORDER — LEVONORGEST-ETH ESTRAD 91-DAY 0.15-0.03 MG PO TABS: 1.0000 | ORAL_TABLET | Freq: Every day | ORAL | 4 refills | Status: DC

## 2016-08-27 MED FILL — INTROVALE 0.15-0.03 MG TAB: 0.15-0.03 | 90 days supply | Qty: 91 | Fill #0

## 2016-08-27 NOTE — Progress Notes (Signed)
Post Partum Exam  Jacqueline Peterson is a 30 y.o. [id number] female who presents for a postpartum visit. She is 6 weeks postpartum following a c-section delivery. I have fully reviewed the prenatal and intrapartum course. The delivery was at 40 gestational weeks.  Anesthesia: epidural. Postpartum course has been unremarkable Baby's course has been unremarkable Baby is feeding by bottle. Bleeding: small amount. Bowel function is normal. Bladder function is normal. Patient is sexually active. Contraception method is condoms; desires birth control pill. Postpartum depression screening:neg (score is 0)  The following portions of the patient's history were reviewed and updated as appropriate: allergies, current medications, past family history, past medical history, past social history, past surgical history and problem list.  Normal pap in 06/18/2015.  Review of Systems Pertinent items noted in HPI and remainder of comprehensive ROS otherwise negative.   Objective:    BP 116/78 mmHg  Pulse 78  Resp 16  Ht 5\' 5"  (1.651 m)  Wt 211 lb (95.709 kg)  BMI 35.11 kg/m2  Breastfeeding? Yes  General:  alert and no distress   Breasts:  deferred  Lungs: clear to auscultation bilaterally  Heart:  regular rate and rhythm  Abdomen: soft, non-tender; bowel sounds normal; no masses,  no organomegaly   Pelvic:  not evaluated        Assessment:   Normal postpartum exam. Pap smear not done at today's visit.   Plan:   1. Contraception: OCP (estrogen/progesterone) prescribed 2. Follow up with PCP for BP check; or with [place] for any GYN concerns.  Verita Schneiders, MD, [place] Attending [place], [facility] for [employer], [place]

## 2016-08-27 NOTE — Patient Instructions (Signed)
Return to clinic for any scheduled appointments or for any gynecologic concerns as needed.   

## 2016-08-28 ENCOUNTER — Encounter: Payer: Self-pay | Admitting: Physician Assistant

## 2016-08-28 ENCOUNTER — Ambulatory Visit (INDEPENDENT_AMBULATORY_CARE_PROVIDER_SITE_OTHER): Payer: 59 | Admitting: Physician Assistant

## 2016-08-28 VITALS — BP 114/81 | HR 87 | Temp 98.1°F | Ht 63.0 in | Wt 259.8 lb

## 2016-08-28 DIAGNOSIS — K219 Gastro-esophageal reflux disease without esophagitis: Secondary | ICD-10-CM

## 2016-08-28 DIAGNOSIS — E039 Hypothyroidism, unspecified: Secondary | ICD-10-CM | POA: Diagnosis not present

## 2016-08-28 DIAGNOSIS — I10 Essential (primary) hypertension: Secondary | ICD-10-CM | POA: Diagnosis not present

## 2016-08-28 DIAGNOSIS — J3089 Other allergic rhinitis: Secondary | ICD-10-CM | POA: Diagnosis not present

## 2016-08-28 DIAGNOSIS — B009 Herpesviral infection, unspecified: Secondary | ICD-10-CM

## 2016-08-28 DIAGNOSIS — Z0000 Encounter for general adult medical examination without abnormal findings: Secondary | ICD-10-CM | POA: Diagnosis not present

## 2016-08-28 MED ORDER — AMLODIPINE BESYLATE 10 MG PO TABS: 10.0000 mg | ORAL_TABLET | Freq: Every day | ORAL | 3 refills | Status: DC

## 2016-08-28 MED ORDER — VALACYCLOVIR HCL 1 G PO TABS: 1000.0000 mg | ORAL_TABLET | Freq: Every day | ORAL | 3 refills | Status: DC

## 2016-08-28 MED FILL — valACYclovir HCL 1 GM TABS: 1 | 90 days supply | Qty: 90 | Fill #0

## 2016-08-28 NOTE — Progress Notes (Signed)
BP 114/81   Pulse 87   Temp 98.1 F (36.7 C) (Oral)   Ht 5' 3" (1.6 m)   Wt 259 lb 12.8 oz (117.8 kg)   BMI 46.02 kg/m    Subjective:    Patient ID: Jacqueline Peterson, female    DOB: [birth year], 30 y.o.   MRN: [id number]  Jacqueline Peterson is a 30 y.o. female presenting on 08/28/2016 for New Patient (Initial Visit) (establish care)  HPI  This patient comes in to be established today as an inpatient. On August 31 she had a C-section through [facility] women[facility] care. She has had no difficulty in her postpartum time. She has had no depression that has been noted. She needed follow-up for her maintenance medications. Around the age of 72 she stated her hypothyroidism diagnosis was made. After this was corrected she was able to get pregnant and had an uneventful pregnancy.  She had plan to see an allergist for urticaria. But that is when she found out she was pregnant. She does take Zyrtec daily. I have recommended that she get back in touch with the allergist for a full workup on this. She already takes pantoprazole. I did not want to start a histamine 2 blocker on her today.  All of her medications and past medical history is reviewed. She does not have any other complaints at this time.  Relevant past medical, surgical, family and social history reviewed and updated as indicated. Interim medical history since our last visit reviewed. Allergies and medications reviewed and updated.   Data reviewed from any sources in EPIC.  Review of Systems  Constitutional: Negative.  Negative for activity change, fatigue and fever.  HENT: Negative.   Eyes: Negative.   Respiratory: Negative.  Negative for cough.   Cardiovascular: Negative.  Negative for chest pain.  Gastrointestinal: Negative.  Negative for abdominal pain.  Endocrine: Negative.   Genitourinary: Negative.  Negative for dysuria.  Musculoskeletal: Negative.   Skin: Positive for color change and rash.  Neurological: Negative.       Social History   Social History  . Marital status: Single    Spouse name: N/A  . Number of children: 0  . Years of education: N/A   Occupational History  . [employer]    Social History Main Topics  . Smoking status: Former Smoker    Packs/day: 0.50    Years: 6.00    Quit date: 12/24/2014  . Smokeless tobacco: Never Used  . Alcohol use 0.0 oz/week     Comment: Occasional  . Drug use: No  . Sexual activity: Yes    Birth control/ protection: None   Other Topics Concern  . Not on file   Social History Narrative   Lives in [place] with husband. Dog and cat in house. Works at [employer].      Regular Exercise -  NO   Daily Caffeine Use:  1 tea    Past Surgical History:  Procedure Laterality Date  . CESAREAN SECTION N/A 07/16/2016   Procedure: CESAREAN SECTION;  Surgeon: Chancy Milroy, MD;  Location: [place];  Service: Obstetrics;  Laterality: N/A;  . NO PAST SURGERIES      Family History  Problem Relation Age of Onset  . Fibroids Mother   . Thyroid disease Mother     Hypo  . Basal cell carcinoma Mother   . Hypertension Mother   . Cancer Father 17    Non Hodgkins Adreanal  carcinoma, Non small cell  . Hypertension Father   . Early death Father   . ADD / ADHD Sister   . Kidney disease Maternal Uncle   . Hypertension Maternal Uncle   . Anxiety disorder Maternal Uncle   . Diabetes Maternal Grandmother     Type 1  . Pancreatitis Maternal Grandmother   . Aneurysm Maternal Grandmother     brain  . Hypertension Maternal Grandfather   . Rheumatic fever Maternal Grandfather   . Hypertension Paternal Grandmother   . Stroke Paternal Grandmother   . Lymphoma Paternal Grandfather   . Hypertension Paternal Grandfather   . Diabetes Paternal Grandfather   . Cancer Other     60's Breast Ca  . Rheum arthritis Other       Medication List       Accurate as of 08/28/16 10:12 AM. Always use your most recent med list.            acetaminophen 325 MG tablet Commonly known as:  TYLENOL Take 325 mg by mouth every 6 (six) hours as needed for moderate pain.   amLODipine 10 MG tablet Commonly known as:  NORVASC Take 1 tablet (10 mg total) by mouth daily.   levonorgestrel-ethinyl estradiol 0.15-0.03 MG tablet Commonly known as:  SEASONALE,INTROVALE,JOLESSA Take 1 tablet by mouth daily.   levothyroxine 75 MCG tablet Commonly known as:  SYNTHROID, LEVOTHROID TAKE 1 TABLET (75 MCG TOTAL) BY MOUTH DAILY.   pantoprazole 40 MG tablet Commonly known as:  PROTONIX Take 1 tablet (40 mg total) by mouth daily.   prenatal multivitamin Tabs tablet Take 1 tablet by mouth daily at 12 noon.   valACYclovir 1000 MG tablet Commonly known as:  VALTREX Take 1 tablet (1,000 mg total) by mouth daily.   ZYRTEC PO Take 1 tablet by mouth daily.          Objective:    BP 114/81   Pulse 87   Temp 98.1 F (36.7 C) (Oral)   Ht 5' 3" (1.6 m)   Wt 259 lb 12.8 oz (117.8 kg)   BMI 46.02 kg/m   Allergies  Allergen Reactions  . Saxenda [Liraglutide -Weight Management]     nausea   Wt Readings from Last 3 Encounters:  08/28/16 259 lb 12.8 oz (117.8 kg)  08/27/16 259 lb (117.5 kg)  07/23/16 263 lb (119.3 kg)    Physical Exam  Constitutional: She is oriented to person, place, and time. Vital signs are normal. She appears well-developed and well-nourished.  Morbidly obese female.  HENT:  Head: Normocephalic and atraumatic.  Eyes: Conjunctivae and EOM are normal. Pupils are equal, round, and reactive to light.  Neck: Normal range of motion. Neck supple.  Cardiovascular: Normal rate, regular rhythm, normal heart sounds and intact distal pulses.   Pulmonary/Chest: Effort normal and breath sounds normal.  Abdominal: Soft. Bowel sounds are normal.  Neurological: She is alert and oriented to person, place, and time. She has normal reflexes.  Skin: Skin is warm and dry. Rash noted. Rash is urticarial. There is erythema.      Psychiatric: She has a normal mood and affect. Her behavior is normal. Judgment and thought content normal.    Results for orders placed or performed in visit on 08/27/16  POCT urine pregnancy  Result Value Ref Range   Preg Test, Ur Negative Negative      Assessment & Plan:   1. Essential hypertension - TSH - amLODipine (NORVASC) 10 MG tablet; Take 1 tablet (10  mg total) by mouth daily.  Dispense: 90 tablet; Refill: 3  2. Chronic non-seasonal allergic rhinitis, unspecified trigger - CMP14+EGFR - Lipid panel  3. Gastroesophageal reflux disease without esophagitis  4. Hypothyroidism, unspecified type  5. Well adult exam Not performed today, but included for her well annual labs. - CBC with Differential/Platelet - CMP14+EGFR - Lipid panel  6. Herpes - valACYclovir (VALTREX) 1000 MG tablet; Take 1 tablet (1,000 mg total) by mouth daily.  Dispense: 90 tablet; Refill: 3  7. Urticaria Continue Zyrtec 10 mg daily, plan allergy consult as she had planned before.  Continue all other maintenance medications as listed above. Educational handout given for urticaria  Follow up plan: Return in about 6 months (around 02/26/2017).  Terald Sleeper PA-C [place] [address]  [place], [place] [address] [phone]   08/28/2016, 10:12 AM

## 2016-08-28 NOTE — Patient Instructions (Signed)
Hives Hives are itchy, red, swollen areas of the skin. They can vary in size and location on your body. Hives can come and go for hours or several days (acute hives) or for several weeks (chronic hives). Hives do not spread from person to person (noncontagious). They may get worse with scratching, exercise, and emotional stress. CAUSES   Allergic reaction to food, additives, or drugs.  Infections, including the common cold.  Illness, such as vasculitis, lupus, or thyroid disease.  Exposure to sunlight, heat, or cold.  Exercise.  Stress.  Contact with chemicals. SYMPTOMS   Red or white swollen patches on the skin. The patches may change size, shape, and location quickly and repeatedly.  Itching.  Swelling of the hands, feet, and face. This may occur if hives develop deeper in the skin. DIAGNOSIS  Your caregiver can usually tell what is wrong by performing a physical exam. Skin or blood tests may also be done to determine the cause of your hives. In some cases, the cause cannot be determined. TREATMENT  Mild cases usually get better with medicines such as antihistamines. Severe cases may require an emergency epinephrine injection. If the cause of your hives is known, treatment includes avoiding that trigger.  HOME CARE INSTRUCTIONS   Avoid causes that trigger your hives.  Take antihistamines as directed by your caregiver to reduce the severity of your hives. Non-sedating or low-sedating antihistamines are usually recommended. Do not drive while taking an antihistamine.  Take any other medicines prescribed for itching as directed by your caregiver.  Wear loose-fitting clothing.  Keep all follow-up appointments as directed by your caregiver. SEEK MEDICAL CARE IF:   You have persistent or severe itching that is not relieved with medicine.  You have painful or swollen joints. SEEK IMMEDIATE MEDICAL CARE IF:   You have a fever.  Your tongue or lips are swollen.  You have  trouble breathing or swallowing.  You feel tightness in the throat or chest.  You have abdominal pain. These problems may be the first sign of a life-threatening allergic reaction. Call your local emergency services (911 in [place] MAKE SURE YOU:   Understand these instructions.  Will watch your condition.  Will get help right away if you are not doing well or get worse.   This information is not intended to replace advice given to you by your health care provider. Make sure you discuss any questions you have with your health care provider.   Document Released: 11/02/2005 Document Revised: 11/07/2013 Document Reviewed: 01/26/2012 Elsevier Interactive Patient Education 2016 Elsevier Inc.  

## 2016-08-29 LAB — CMP14+EGFR
A/G RATIO: 1.5 (ref 1.2–2.2)
ALBUMIN: 4.1 g/dL (ref 3.5–5.5)
ALK PHOS: 106 IU/L (ref 39–117)
ALT: 14 IU/L (ref 0–32)
AST: 18 IU/L (ref 0–40)
BILIRUBIN TOTAL: 0.4 mg/dL (ref 0.0–1.2)
BUN/Creatinine Ratio: 13 (ref 9–23)
BUN: 12 mg/dL (ref 6–20)
CO2: 24 mmol/L (ref 18–29)
CREATININE: 0.9 mg/dL (ref 0.57–1.00)
Calcium: 9.3 mg/dL (ref 8.7–10.2)
Chloride: 98 mmol/L (ref 96–106)
GFR calc Af Amer: 99 mL/min/{1.73_m2} (ref >59)
GFR calc non Af Amer: 85 mL/min/{1.73_m2} (ref >59)
GLOBULIN, TOTAL: 2.7 g/dL (ref 1.5–4.5)
Glucose: 92 mg/dL (ref 65–99)
POTASSIUM: 4.8 mmol/L (ref 3.5–5.2)
SODIUM: 139 mmol/L (ref 134–144)
TOTAL PROTEIN: 6.8 g/dL (ref 6.0–8.5)

## 2016-08-29 LAB — CBC WITH DIFFERENTIAL/PLATELET
BASOS ABS: 0.1 10*3/uL (ref 0.0–0.2)
BASOS: 1 %
EOS (ABSOLUTE): 0.1 10*3/uL (ref 0.0–0.4)
Eos: 2 %
HEMATOCRIT: 34.7 % (ref 34.0–46.6)
HEMOGLOBIN: 11.5 g/dL (ref 11.1–15.9)
IMMATURE GRANS (ABS): 0 10*3/uL (ref 0.0–0.1)
Immature Granulocytes: 0 %
LYMPHS ABS: 2.2 10*3/uL (ref 0.7–3.1)
LYMPHS: 29 %
MCH: 27.8 pg (ref 26.6–33.0)
MCHC: 33.1 g/dL (ref 31.5–35.7)
MCV: 84 fL (ref 79–97)
MONOCYTES: 5 %
Monocytes Absolute: 0.4 10*3/uL (ref 0.1–0.9)
NEUTROS ABS: 4.8 10*3/uL (ref 1.4–7.0)
Neutrophils: 63 %
Platelets: 372 10*3/uL (ref 150–379)
RBC: 4.13 x10E6/uL (ref 3.77–5.28)
RDW: 14.2 % (ref 12.3–15.4)
WBC: 7.6 10*3/uL (ref 3.4–10.8)

## 2016-08-29 LAB — LIPID PANEL
CHOLESTEROL TOTAL: 243 mg/dL — AB (ref 100–199)
Chol/HDL Ratio: 4.8 ratio units — ABNORMAL HIGH (ref 0.0–4.4)
HDL: 51 mg/dL (ref >39)
LDL Calculated: 168 mg/dL — ABNORMAL HIGH (ref 0–99)
TRIGLYCERIDES: 121 mg/dL (ref 0–149)
VLDL Cholesterol Cal: 24 mg/dL (ref 5–40)

## 2016-08-29 LAB — TSH: TSH: 0.707 u[IU]/mL (ref 0.450–4.500)

## 2016-09-15 MED FILL — AMLODIPINE BESYLATE 10 MG T: 10 | 90 days supply | Qty: 90 | Fill #0

## 2016-09-28 DIAGNOSIS — L503 Dermatographic urticaria: Secondary | ICD-10-CM | POA: Diagnosis not present

## 2016-09-28 DIAGNOSIS — J3081 Allergic rhinitis due to animal (cat) (dog) hair and dander: Secondary | ICD-10-CM | POA: Diagnosis not present

## 2016-09-28 DIAGNOSIS — J301 Allergic rhinitis due to pollen: Secondary | ICD-10-CM | POA: Diagnosis not present

## 2016-09-28 DIAGNOSIS — J3089 Other allergic rhinitis: Secondary | ICD-10-CM | POA: Diagnosis not present

## 2016-09-29 ENCOUNTER — Ambulatory Visit (INDEPENDENT_AMBULATORY_CARE_PROVIDER_SITE_OTHER): Payer: 59 | Admitting: Family

## 2016-09-29 ENCOUNTER — Encounter: Payer: Self-pay | Admitting: Family

## 2016-09-29 VITALS — BP 128/75 | HR 90 | Temp 97.8°F | Ht 63.0 in | Wt 258.0 lb

## 2016-09-29 DIAGNOSIS — L0291 Cutaneous abscess, unspecified: Secondary | ICD-10-CM | POA: Diagnosis not present

## 2016-09-29 MED ORDER — SULFAMETHOXAZOLE-TRIMETHOPRIM 800-160 MG PO TABS: 1.0000 | ORAL_TABLET | Freq: Two times a day (BID) | ORAL | 0 refills | Status: DC

## 2016-09-29 NOTE — Patient Instructions (Signed)
Skin Abscess A skin abscess is an infected area on or under your skin that contains a collection of pus and other material. An abscess may also be called a furuncle, carbuncle, or boil. An abscess can occur in or on almost any part of your body. Some abscesses break open (rupture) on their own. Most continue to get worse unless they are treated. The infection can spread deeper into the body and eventually into your blood, which can make you feel ill. Treatment usually involves draining the abscess. What are the causes? An abscess occurs when germs, often bacteria, pass through your skin and cause an infection. This may be caused by:  A scrape or cut on your skin.  A puncture wound through your skin, including a needle injection.  Blocked oil or sweat glands.  Blocked and infected hair follicles.  A cyst that forms beneath your skin (sebaceous cyst) and becomes infected. What increases the risk? This condition is more likely to develop in people who:  Have a weak body defense system (immune system).  Have diabetes.  Have dry and irritated skin.  Get frequent injections or use illegal IV drugs.  Have a foreign body in a wound, such as a splinter.  Have problems with their lymph system or veins. What are the signs or symptoms? An abscess may start as a painful, firm bump under the skin. Over time, the abscess may get larger or become softer. Pus may appear at the top of the abscess, causing pressure and pain. It may eventually break through the skin and drain. Other symptoms include:  Redness.  Warmth.  Swelling.  Tenderness.  A sore on the skin. How is this diagnosed? This condition is diagnosed based on your medical history and a physical exam. A sample of pus may be taken from the abscess to find out what is causing the infection and what antibiotics can be used to treat it. You also may have:  Blood tests to look for signs of infection or spread of an infection to your  blood.  Imaging studies such as ultrasound, CT scan, or MRI if the abscess is deep. How is this treated? Small abscesses that drain on their own may not need treatment. Treatment for an abscess that does not rupture on its own may include:  Warm compresses applied to the area several times per day.  Incision and drainage. Your health care provider will make an incision to open the abscess and will remove pus and any foreign body or dead tissue. The incision area may be packed with gauze to keep it open for a few days while it heals.  Antibiotic medicines to treat infection. For a severe abscess, you may first get antibiotics through an IV and then change to oral antibiotics. Follow these instructions at home: Abscess Care   If you have an abscess that has not drained, place a warm, clean, wet washcloth over the abscess several times a day. Do this as told by your health care provider.  Follow instructions from your health care provider about how to take care of your abscess. Make sure you:  Cover the abscess with a bandage (dressing).  Change your dressing or gauze as told by your health care provider.  Wash your hands with soap and water before you change the dressing or gauze. If soap and water are not available, use hand sanitizer.  Check your abscess every day for signs of a worsening infection. Check for:  More redness, swelling, or   pain.  More fluid or blood.  Warmth.  More pus or a bad smell. Medicines   Take over-the-counter and prescription medicines only as told by your health care provider.  If you were prescribed an antibiotic medicine, take it as told by your health care provider. Do not stop taking the antibiotic even if you start to feel better. General instructions   To avoid spreading the infection:  Do not share personal care items, towels, or hot tubs with others.  Avoid making skin contact with other people.  Keep all follow-up visits as told by your  health care provider. This is important. Contact a health care provider if:  You have more redness, swelling, or pain around your abscess.  You have more fluid or blood coming from your abscess.  Your abscess feels warm to the touch.  You have more pus or a bad smell coming from your abscess.  You have a fever.  You have muscle aches.  You have chills or a general ill feeling. Get help right away if:  You have severe pain.  You see red streaks on your skin spreading away from the abscess. This information is not intended to replace advice given to you by your health care provider. Make sure you discuss any questions you have with your health care provider. Document Released: 08/12/2005 Document Revised: 06/28/2016 Document Reviewed: 09/11/2015 Elsevier Interactive Patient Education  2017 Elsevier Inc.  

## 2016-09-29 NOTE — Progress Notes (Addendum)
   Subjective:    Patient ID: Jacqueline Peterson, female    DOB: [birth year], 30 y.o.   MRN: [id number]  HPI Pt presents to the office with an abscess on her left inner thigh. Pt states she noticed it three days ago and soaked in a hot bath. Pt states it "came to a head" and drained a small serous fluid, but is not very painful, tender, hard, and warm.    Review of Systems  Skin: Positive for wound.       Objective:   Physical Exam  Constitutional: She is oriented to person, place, and time. She appears well-developed and well-nourished. No distress.  HENT:  Head: Normocephalic and atraumatic.  Right Ear: External ear normal.  Mouth/Throat: Oropharynx is clear and moist.  Eyes: Pupils are equal, round, and reactive to light.  Neck: Normal range of motion. Neck supple. No thyromegaly present.  Cardiovascular: Normal rate, regular rhythm, normal heart sounds and intact distal pulses.   No murmur heard. Pulmonary/Chest: Effort normal and breath sounds normal. No respiratory distress. She has no wheezes.  Abdominal: Soft. Bowel sounds are normal. She exhibits no distension. There is no tenderness.  Musculoskeletal: Normal range of motion. She exhibits no edema or tenderness.  Neurological: She is alert and oriented to person, place, and time.  Skin: Skin is warm and dry. There is erythema.  Abscess on left  Medial thigh apprx 1cmX1cm with erythemas border that extends 10 cm X 7 cm, warm, tender, and hard  Psychiatric: She has a normal mood and affect. Her behavior is normal. Judgment and thought content normal.  Vitals reviewed.    BP 128/75   Pulse 90   Temp 97.8 F (36.6 C) (Oral)   Ht 5\' 3"  (1.6 m)   Wt 258 lb (117 kg)   BMI 45.70 kg/m       Assessment & Plan:  1. Abscess -Warm compresses -Do not squeeze -Keep clean and dry -RTO Prn  - sulfamethoxazole-trimethoprim (BACTRIM DS) 800-160 MG tablet; Take 1 tablet by mouth 2 (two) times daily.  Dispense: 14 tablet;  Refill: 0  Evelina Dun, FNP

## 2016-10-02 DIAGNOSIS — B999 Unspecified infectious disease: Secondary | ICD-10-CM | POA: Diagnosis not present

## 2016-11-11 MED FILL — LEVOTHYROXINE 75 MCG TABLET: 75 | 90 days supply | Qty: 90 | Fill #2

## 2016-11-17 ENCOUNTER — Ambulatory Visit (INDEPENDENT_AMBULATORY_CARE_PROVIDER_SITE_OTHER): Payer: 59 | Admitting: Family Medicine

## 2016-11-17 ENCOUNTER — Encounter: Payer: Self-pay | Admitting: Family Medicine

## 2016-11-17 VITALS — BP 127/79 | HR 82 | Temp 98.6°F | Ht 63.0 in | Wt 267.0 lb

## 2016-11-17 DIAGNOSIS — E039 Hypothyroidism, unspecified: Secondary | ICD-10-CM | POA: Diagnosis not present

## 2016-11-17 DIAGNOSIS — F338 Other recurrent depressive disorders: Secondary | ICD-10-CM | POA: Insufficient documentation

## 2016-11-17 DIAGNOSIS — G4700 Insomnia, unspecified: Secondary | ICD-10-CM

## 2016-11-17 DIAGNOSIS — F339 Major depressive disorder, recurrent, unspecified: Secondary | ICD-10-CM | POA: Diagnosis not present

## 2016-11-17 MED ORDER — ALPRAZOLAM 0.25 MG PO TABS: 0.2500 mg | ORAL_TABLET | Freq: Every evening | ORAL | 0 refills | Status: DC | PRN

## 2016-11-17 MED ORDER — BUPROPION HCL ER (XL) 150 MG PO TB24: 150.0000 mg | ORAL_TABLET | Freq: Every day | ORAL | 4 refills | Status: DC

## 2016-11-17 MED FILL — BUPROPION HCL XL 150 MG TAB: 150 | 30 days supply | Qty: 30 | Fill #0

## 2016-11-17 NOTE — Progress Notes (Signed)
Subjective:  Patient ID: Jacqueline Peterson, female    DOB: [birth year]  Age: 31 y.o. MRN: [id number]  CC: Depression (pt here today to discuss going back on Wellbutrin which she has been on in the past and usually has to take it in the winter months)   HPI Jacqueline Peterson presents for annually starts feeling down, sad & withdrawn. PHQ noted below. Has responded in past to wellbutrin. Asks for xanax to help until wellbutrin kicks in.  Depression screen [employer] 2/9 11/17/2016 09/29/2016 08/28/2016 01/09/2016 07/10/2013  Decreased Interest 1 0 0 0 0  Down, Depressed, Hopeless 1 0 0 0 0  PHQ - 2 Score 2 0 0 0 0  Altered sleeping 0 - - - -  Tired, decreased energy 1 - - - -  Change in appetite 2 - - - -  Feeling bad or failure about yourself  1 - - - -  Trouble concentrating 0 - - - -  Moving slowly or fidgety/restless 0 - - - -  Suicidal thoughts 0 - - - -  PHQ-9 Score 6 - - - -    History Jacqueline Peterson has a past medical history of History of chicken pox; HSV infection; and Hypothyroidism.   She has a past surgical history that includes No past surgeries and Cesarean section (N/A, 07/16/2016).   Her family history includes ADD / ADHD in her sister; Aneurysm in her maternal grandmother; Anxiety disorder in her maternal uncle; Basal cell carcinoma in her mother; Cancer in her other; Cancer (age of onset: 81) in her father; Diabetes in her maternal grandmother and paternal grandfather; Early death in her father; Fibroids in her mother; Hypertension in her father, maternal grandfather, maternal uncle, mother, paternal grandfather, and paternal grandmother; Kidney disease in her maternal uncle; Lymphoma in her paternal grandfather; Pancreatitis in her maternal grandmother; Rheum arthritis in her other; Rheumatic fever in her maternal grandfather; Stroke in her paternal grandmother; Thyroid disease in her mother.She reports that she quit smoking about 22 months ago. She has a 3.00 pack-year smoking  history. She has never used smokeless tobacco. She reports that she drinks alcohol. She reports that she does not use drugs.    ROS Review of Systems  Constitutional: Negative for activity change, appetite change and fever.  HENT: Negative for congestion, rhinorrhea and sore throat.   Eyes: Negative for visual disturbance.  Respiratory: Negative for cough and shortness of breath.   Cardiovascular: Negative for chest pain and palpitations.  Gastrointestinal: Negative for abdominal pain, diarrhea and nausea.  Genitourinary: Negative for dysuria.  Musculoskeletal: Negative for arthralgias and myalgias.  Psychiatric/Behavioral: Positive for decreased concentration, dysphoric mood and sleep disturbance. The patient is nervous/anxious.     Objective:  BP 127/79   Pulse 82   Temp 98.6 F (37 C) (Oral)   Ht 5\' 3"  (1.6 m)   Wt 267 lb (121.1 kg)   BMI 47.30 kg/m   BP Readings from Last 3 Encounters:  11/17/16 127/79  09/29/16 128/75  08/28/16 114/81    Wt Readings from Last 3 Encounters:  11/17/16 267 lb (121.1 kg)  09/29/16 258 lb (117 kg)  08/28/16 259 lb 12.8 oz (117.8 kg)     Physical Exam  Constitutional: She is oriented to person, place, and time. She appears well-developed and well-nourished. No distress.  HENT:  Head: Normocephalic and atraumatic.  Eyes: Conjunctivae are normal. Pupils are equal, round, and reactive to light.  Neck: Normal range of motion. Neck supple. No  thyromegaly present.  Cardiovascular: Normal rate, regular rhythm and normal heart sounds.   No murmur heard. Pulmonary/Chest: Effort normal and breath sounds normal. No respiratory distress. She has no wheezes. She has no rales.  Abdominal: Soft. Bowel sounds are normal. She exhibits no distension. There is no tenderness.  Musculoskeletal: Normal range of motion.  Lymphadenopathy:    She has no cervical adenopathy.  Neurological: She is alert and oriented to person, place, and time.  Skin: Skin  is warm and dry.  Psychiatric: She has a normal mood and affect. Her behavior is normal. Judgment and thought content normal.     Lab Results  Component Value Date   WBC 7.6 08/28/2016   HGB 9.4 (L) 07/17/2016   HCT 34.7 08/28/2016   PLT 372 08/28/2016   GLUCOSE 92 08/28/2016   CHOL 243 (H) 08/28/2016   TRIG 121 08/28/2016   HDL 51 08/28/2016   LDLCALC 168 (H) 08/28/2016   ALT 14 08/28/2016   AST 18 08/28/2016   NA 139 08/28/2016   K 4.8 08/28/2016   CL 98 08/28/2016   CREATININE 0.90 08/28/2016   BUN 12 08/28/2016   CO2 24 08/28/2016   TSH 0.707 08/28/2016   HGBA1C 5.3 06/28/2015    [place] Mfm Fetal Bpp Wo Non Stress  Result Date: 06/24/2016 OBSTETRICAL ULTRASOUND: This exam was performed within a [facility]. The OB US report was generated in the AS system, and faxed to the ordering physician.  This report is available in the [facility]. See the AS Obstetric US report via the Image Link.  [place] Mfm Ob Follow Up  Result Date: 06/24/2016 OBSTETRICAL ULTRASOUND: This exam was performed within a [facility]. The OB US report was generated in the AS system, and faxed to the ordering physician.  This report is available in the [facility]. See the [facility] Obstetric US report via the Image Link.   Assessment & Plan:   Sarh was seen today for depression.  Diagnoses and all orders for this visit:  Insomnia, unspecified type  Seasonal affective disorder (HCC)  Hypothyroidism, unspecified type  Other orders -     ALPRAZolam (XANAX) 0.25 MG tablet; Take 1 tablet (0.25 mg total) by mouth at bedtime as needed for anxiety or sleep. -     buPROPion (WELLBUTRIN XL) 150 MG 24 hr tablet; Take 1 tablet (150 mg total) by mouth daily.     I have discontinued Jacqueline Peterson's acetaminophen and sulfamethoxazole-trimethoprim. I am also having her start on ALPRAZolam and buPROPion. Additionally, I am having her maintain her levothyroxine, Cetirizine  HCl (ZYRTEC PO), prenatal multivitamin, pantoprazole, levonorgestrel-ethinyl estradiol, amLODipine, and valACYclovir.  Meds ordered this encounter  Medications  . ALPRAZolam (XANAX) 0.25 MG tablet    Sig: Take 1 tablet (0.25 mg total) by mouth at bedtime as needed for anxiety or sleep.    Dispense:  30 tablet    Refill:  0  . buPROPion (WELLBUTRIN XL) 150 MG 24 hr tablet    Sig: Take 1 tablet (150 mg total) by mouth daily.    Dispense:  30 tablet    Refill:  4     Follow-up: Return in about 3 months (around 02/15/2017).  Claretta Fraise, M.D.

## 2016-11-17 NOTE — Patient Instructions (Signed)
It was a pleasure to meet you. Thanks for coming in!  Happy New Year!  Best Regards, Claretta Fraise, M.D.

## 2016-11-18 MED FILL — ALPRAZolam 0.25 MG TABS: 0.25 | 30 days supply | Qty: 30 | Fill #0

## 2016-11-24 MED FILL — INTROVALE 0.15-0.03 MG TAB: 0.15-0.03 | 90 days supply | Qty: 91 | Fill #1

## 2016-12-06 DIAGNOSIS — N390 Urinary tract infection, site not specified: Secondary | ICD-10-CM | POA: Diagnosis not present

## 2016-12-07 ENCOUNTER — Ambulatory Visit (INDEPENDENT_AMBULATORY_CARE_PROVIDER_SITE_OTHER): Payer: 59 | Admitting: Family Medicine

## 2016-12-07 ENCOUNTER — Encounter: Payer: Self-pay | Admitting: Family Medicine

## 2016-12-07 VITALS — BP 125/89 | HR 89 | Temp 97.7°F | Ht 63.0 in | Wt 264.4 lb

## 2016-12-07 DIAGNOSIS — R509 Fever, unspecified: Secondary | ICD-10-CM

## 2016-12-07 DIAGNOSIS — N3000 Acute cystitis without hematuria: Secondary | ICD-10-CM

## 2016-12-07 LAB — VERITOR FLU A/B WAIVED
INFLUENZA A: NEGATIVE
Influenza B: NEGATIVE

## 2016-12-07 MED ORDER — CEPHALEXIN 500 MG PO CAPS: 500.0000 mg | ORAL_CAPSULE | Freq: Three times a day (TID) | ORAL | 0 refills | Status: DC

## 2016-12-07 NOTE — Progress Notes (Signed)
   HPI  Patient presents today here with fever.  Patient explains that she's had UTI symptoms for about one week and was started on Bactrim one day ago for this.  This morning she woke up with fever of 102.3, body aches, facial pain, and headaches that started last night.  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 Tolerating food and fluids normally. She has normal respirations no dyspnea. She has a 7-month-old son.   PMH: Smoking status noted ROS: Per HPI  Objective: BP 125/89   Pulse 89   Temp 97.7 F (36.5 C) (Oral)   Ht 5\' 3"  (1.6 m)   Wt 264 lb 6.4 oz (119.9 kg)   BMI 46.84 kg/m  Gen: NAD, alert, cooperative with exam HEENT: NCAT, oropharynx moist and clear, no tenderness to palpation of the maxillary frontal sinuses.  CV: RRR, good S1/S2, no murmur Resp: CTABL, no wheezes, non-labored [id number], no suprapubic tenderness, no CVA tenderness  Ext: No edema, warm Neuro: Alert and oriented, No gross deficits  Assessment and plan:  # UTI Flu-like symptoms, however Flynn negative, she does have an insulin UTI from urinalysis at urgent care. Add Keflex to double cover, Offered rocephin - She declines.  No signs of pyelo RTC with any concerns     Orders Placed This Encounter  Procedures  . Veritor Flu A/B Waived    Order Specific Question:   Source    Answer:   nasopharynx    Meds ordered this encounter  Medications  . sulfamethoxazole-trimethoprim (BACTRIM DS,SEPTRA DS) 800-160 MG tablet  . cephALEXin (KEFLEX) 500 MG capsule    Sig: Take 1 capsule (500 mg total) by mouth 3 (three) times daily.    Dispense:  21 capsule    Refill:  [place], MD [facility] 12/07/2016, 2:19 PM

## 2016-12-07 NOTE — Patient Instructions (Signed)
Great to meet you!  Start kefelx + bactrim.   Call with any concerns   Urinary Tract Infection, Adult A urinary tract infection (UTI) is an infection of any part of the urinary tract, which includes the kidneys, ureters, bladder, and urethra. These organs make, store, and get rid of urine in the body. UTI can be a bladder infection (cystitis) or kidney infection (pyelonephritis). What are the causes? This infection may be caused by fungi, viruses, or bacteria. Bacteria are the most common cause of UTIs. This condition can also be caused by repeated incomplete emptying of the bladder during urination. What increases the risk? This condition is more likely to develop if:  You ignore your need to urinate or hold urine for long periods of time.  You do not empty your bladder completely during urination.  You wipe back to front after urinating or having a bowel movement, if you are female.  You are uncircumcised, if you are female.  You are constipated.  You have a urinary catheter that stays in place (indwelling).  You have a weak defense (immune) system.  You have a medical condition that affects your bowels, kidneys, or bladder.  You have diabetes.  You take antibiotic medicines frequently or for long periods of time, and the antibiotics no longer work well against certain types of infections (antibiotic resistance).  You take medicines that irritate your urinary tract.  You are exposed to chemicals that irritate your urinary tract.  You are female. What are the signs or symptoms? Symptoms of this condition include:  Fever.  Frequent urination or passing small amounts of urine frequently.  Needing to urinate urgently.  Pain or burning with urination.  Urine that smells bad or unusual.  Cloudy urine.  Pain in the lower abdomen or back.  Trouble urinating.  Blood in the urine.  Vomiting or being less hungry than normal.  Diarrhea or abdominal pain.  Vaginal  discharge, if you are female. How is this diagnosed? This condition is diagnosed with a medical history and physical exam. You will also need to provide a urine sample to test your urine. Other tests may be done, including:  Blood tests.  Sexually transmitted disease (STD) testing. If you have had more than one UTI, a cystoscopy or imaging studies may be done to determine the cause of the infections. How is this treated? Treatment for this condition often includes a combination of two or more of the following:  Antibiotic medicine.  Other medicines to treat less common causes of UTI.  Over-the-counter medicines to treat pain.  Drinking enough water to stay hydrated. Follow these instructions at home:  Take over-the-counter and prescription medicines only as told by your health care provider.  If you were prescribed an antibiotic, take it as told by your health care provider. Do not stop taking the antibiotic even if you start to feel better.  Avoid alcohol, caffeine, tea, and carbonated beverages. They can irritate your bladder.  Drink enough fluid to keep your urine clear or pale yellow.  Keep all follow-up visits as told by your health care provider. This is important.  Make sure to:  Empty your bladder often and completely. Do not hold urine for long periods of time.  Empty your bladder before and after sex.  Wipe from front to back after a bowel movement if you are female. Use each tissue one time when you wipe. Contact a health care provider if:  You have back pain.  You have  a fever.  You feel nauseous or vomit.  Your symptoms do not get better after 3 days.  Your symptoms go away and then return. Get help right away if:  You have severe back pain or lower abdominal pain.  You are vomiting and cannot keep down any medicines or water. This information is not intended to replace advice given to you by your health care provider. Make sure you discuss any  questions you have with your health care provider. Document Released: 08/12/2005 Document Revised: 04/15/2016 Document Reviewed: 09/23/2015 Elsevier Interactive Patient Education  2017 [employer].

## 2016-12-21 MED FILL — buPROPion HCL ER (XL) 150 M: 150 | 30 days supply | Qty: 30 | Fill #1

## 2016-12-21 MED FILL — AMLODIPINE BESYLATE 10 MG T: 10 | 90 days supply | Qty: 90 | Fill #1

## 2017-01-12 DIAGNOSIS — H608X2 Other otitis externa, left ear: Secondary | ICD-10-CM | POA: Diagnosis not present

## 2017-01-12 DIAGNOSIS — J019 Acute sinusitis, unspecified: Secondary | ICD-10-CM | POA: Diagnosis not present

## 2017-01-21 MED FILL — valACYclovir HCL 1 GM TABS: 1 | 90 days supply | Qty: 90 | Fill #1

## 2017-01-21 MED FILL — buPROPion HCL ER (XL) 150 M: 150 | 30 days supply | Qty: 30 | Fill #2

## 2017-01-26 ENCOUNTER — Encounter: Payer: Self-pay | Admitting: Physician Assistant

## 2017-01-26 ENCOUNTER — Ambulatory Visit (INDEPENDENT_AMBULATORY_CARE_PROVIDER_SITE_OTHER): Payer: 59 | Admitting: Physician Assistant

## 2017-01-26 VITALS — BP 118/84 | HR 99 | Temp 98.7°F | Ht 62.0 in | Wt 263.6 lb

## 2017-01-26 DIAGNOSIS — I493 Ventricular premature depolarization: Secondary | ICD-10-CM | POA: Diagnosis not present

## 2017-01-26 MED ORDER — BUPROPION HCL ER (XL) 300 MG PO TB24: 300.0000 mg | ORAL_TABLET | Freq: Every day | ORAL | 3 refills | Status: DC

## 2017-01-26 NOTE — Progress Notes (Signed)
BP 118/84   Pulse 99   Temp 98.7 F (37.1 C) (Oral)   Ht 5\' 2"  (1.575 m)   Wt 263 lb 9.6 oz (119.6 kg)   LMP 01/19/2017   BMI 48.21 kg/m    Subjective:    Patient ID: Jacqueline Peterson, female    DOB: 1986-01-19, 31 y.o.   MRN: [id number]  HPI: Jacqueline Peterson is a 31 y.o. female presenting on 01/26/2017 for Hypertension and Palpitations  This patient comes in for periodic recheck on medications and conditions including palpitations, medication review. Had diagnosed ventricular ectopy with cardiology in [place] some years ago. Used bystolic intermittently and it control any episodes.  Now on amlodipine for BP and doing well but palpitations have returned.  Will order 30 day event monitor from our office.  All medications are reviewed today. There are no reports of any problems with the medications. All of the medical conditions are reviewed and updated.  Lab work is reviewed and will be ordered as medically necessary. There are no new problems reported with today's visit.   Relevant past medical, surgical, family and social history reviewed and updated as indicated. Allergies and medications reviewed and updated.  Past Medical History:  Diagnosis Date  . History of chicken pox   . HSV infection   . Hypothyroidism     Past Surgical History:  Procedure Laterality Date  . CESAREAN SECTION N/A 07/16/2016   Procedure: CESAREAN SECTION;  Surgeon: Chancy Milroy, MD;  Location: [facility];  Service: Obstetrics;  Laterality: N/A;  . NO PAST SURGERIES      Review of Systems  Constitutional: Negative.  Negative for activity change, fatigue and fever.  HENT: Negative.   Eyes: Negative.   Respiratory: Negative.  Negative for apnea, cough, shortness of breath and wheezing.   Cardiovascular: Positive for palpitations. Negative for chest pain.  Gastrointestinal: Negative.  Negative for abdominal pain.  Endocrine: Negative.   Genitourinary: Negative.  Negative for  dysuria.  Musculoskeletal: Negative.   Skin: Negative.   Neurological: Negative.     Allergies as of 01/26/2017      Reactions   Saxenda [liraglutide -weight Management]    nausea      Medication List       Accurate as of 01/26/17  5:18 PM. Always use your most recent med list.          ALPRAZolam 0.25 MG tablet Commonly known as:  XANAX Take 1 tablet (0.25 mg total) by mouth at bedtime as needed for anxiety or sleep.   amLODipine 10 MG tablet Commonly known as:  NORVASC Take 1 tablet (10 mg total) by mouth daily.   buPROPion 300 MG 24 hr tablet Commonly known as:  WELLBUTRIN XL Take 1 tablet (300 mg total) by mouth daily.   CALCIUM PLUS VITAMIN D PO Take by mouth.   Cranberry 250 MG Caps Take by mouth.   levonorgestrel-ethinyl estradiol 0.15-0.03 MG tablet Commonly known as:  SEASONALE,INTROVALE,JOLESSA Take 1 tablet by mouth daily.   levothyroxine 75 MCG tablet Commonly known as:  SYNTHROID, LEVOTHROID TAKE 1 TABLET (75 MCG TOTAL) BY MOUTH DAILY.   Melatonin 3 MG Tabs Take by mouth.   pantoprazole 40 MG tablet Commonly known as:  PROTONIX Take 1 tablet (40 mg total) by mouth daily.   prenatal multivitamin Tabs tablet Take 1 tablet by mouth daily at 12 noon.   valACYclovir 1000 MG tablet Commonly known as:  VALTREX Take 1 tablet (1,000 mg total)  by mouth daily.   vitamin C 1000 MG tablet Take 1,000 mg by mouth daily.   ZYRTEC PO Take 1 tablet by mouth daily.          Objective:    BP 118/84   Pulse 99   Temp 98.7 F (37.1 C) (Oral)   Ht 5\' 2"  (1.575 m)   Wt 263 lb 9.6 oz (119.6 kg)   LMP 01/19/2017   BMI 48.21 kg/m   Allergies  Allergen Reactions  . Saxenda [Liraglutide -Weight Management]     nausea    Physical Exam  Constitutional: She is oriented to person, place, and time. She appears well-developed and well-nourished.  HENT:  Head: Normocephalic and atraumatic.  Eyes: Conjunctivae and EOM are normal. Pupils are equal,  round, and reactive to light.  Cardiovascular: Normal rate, regular rhythm, normal heart sounds and intact distal pulses.   Pulmonary/Chest: Effort normal and breath sounds normal.  Abdominal: Soft. Bowel sounds are normal.  Neurological: She is alert and oriented to person, place, and time. She has normal reflexes.  Skin: Skin is warm and dry. No rash noted.  Psychiatric: She has a normal mood and affect. Her behavior is normal. Judgment and thought content normal.  Nursing note and vitals reviewed.       Assessment & Plan:   1. Ventricular ectopy - Cardiac event monitor; Future   Continue all other maintenance medications as listed above.  Follow up plan: Return in about 4 weeks (around 02/23/2017) for recheck.  Educational handout given for palpitations  Terald Sleeper PA-C [place] [address]  [place], [place] [address] [phone]   01/26/2017, 5:18 PM

## 2017-01-26 NOTE — Patient Instructions (Signed)
Palpitations A palpitation is the feeling that your heart:  Has an uneven (irregular) heartbeat.  Is beating faster than normal.  Is fluttering.  Is skipping a beat. This is usually not a serious problem. In some cases, you may need more medical tests. Follow these instructions at home:  Avoid:  Caffeine in coffee, tea, soft drinks, diet pills, and energy drinks.  Chocolate.  Alcohol.  Do not use any tobacco products. These include cigarettes, chewing tobacco, and e-cigarettes. If you need help quitting, ask your doctor.  Try to reduce your stress. These things may help:  Yoga.  Meditation.  Physical activity. Swimming, jogging, and walking are good choices.  A method that helps you use your mind to control things in your body, like heartbeats (biofeedback).  Get plenty of rest and sleep.  Take over-the-counter and prescription medicines only as told by your doctor.  Keep all follow-up visits as told by your doctor. This is important. Contact a doctor if:  Your heartbeat is still fast or uneven after 24 hours.  Your palpitations occur more often. Get help right away if:  You have chest pain.  You feel short of breath.  You have a very bad headache.  You feel dizzy.  You pass out (faint). This information is not intended to replace advice given to you by your health care provider. Make sure you discuss any questions you have with your health care provider. Document Released: 08/11/2008 Document Revised: 04/09/2016 Document Reviewed: 07/18/2015 Elsevier Interactive Patient Education  2017 [employer].

## 2017-01-28 DIAGNOSIS — R002 Palpitations: Secondary | ICD-10-CM | POA: Diagnosis not present

## 2017-01-29 DIAGNOSIS — N390 Urinary tract infection, site not specified: Secondary | ICD-10-CM | POA: Diagnosis not present

## 2017-02-10 MED FILL — BUPROPION HCL XL 300 MG TAB: 300 | 90 days supply | Qty: 90 | Fill #0

## 2017-02-24 ENCOUNTER — Encounter: Payer: Self-pay | Admitting: Physician Assistant

## 2017-02-24 ENCOUNTER — Other Ambulatory Visit: Payer: Self-pay | Admitting: Family

## 2017-02-24 MED FILL — LEVOTHYROXINE 75 MCG TABLET: 75 | 90 days supply | Qty: 90 | Fill #0

## 2017-02-24 NOTE — Telephone Encounter (Signed)
Must get in to see provider before next refill following this one is approved.

## 2017-02-25 MED FILL — INTROVALE 0.15-0.03 MG TAB: 0.15-0.03 | 90 days supply | Qty: 91 | Fill #2

## 2017-03-19 MED FILL — AMLODIPINE BESYLATE 10 MG T: 10 | 90 days supply | Qty: 90 | Fill #2

## 2017-04-12 DIAGNOSIS — N390 Urinary tract infection, site not specified: Secondary | ICD-10-CM | POA: Diagnosis not present

## 2017-04-13 ENCOUNTER — Encounter: Payer: Self-pay | Admitting: Physician Assistant

## 2017-04-13 ENCOUNTER — Ambulatory Visit (INDEPENDENT_AMBULATORY_CARE_PROVIDER_SITE_OTHER): Payer: 59 | Admitting: Physician Assistant

## 2017-04-13 VITALS — BP 112/79 | HR 84 | Temp 98.2°F | Ht 62.0 in | Wt 259.2 lb

## 2017-04-13 DIAGNOSIS — I493 Ventricular premature depolarization: Secondary | ICD-10-CM

## 2017-04-13 DIAGNOSIS — I10 Essential (primary) hypertension: Secondary | ICD-10-CM | POA: Diagnosis not present

## 2017-04-13 DIAGNOSIS — N390 Urinary tract infection, site not specified: Secondary | ICD-10-CM

## 2017-04-13 MED ORDER — LISINOPRIL 10 MG PO TABS: 10.0000 mg | ORAL_TABLET | Freq: Every day | ORAL | 0 refills | Status: DC

## 2017-04-13 MED FILL — LISINOPRIL 10 MG TABLET: 10 | 30 days supply | Qty: 30 | Fill #0

## 2017-04-13 NOTE — Patient Instructions (Signed)
DASH Eating Plan DASH stands for "Dietary Approaches to Stop Hypertension." The DASH eating plan is a healthy eating plan that has been shown to reduce high blood pressure (hypertension). It may also reduce your risk for type 2 diabetes, heart disease, and stroke. The DASH eating plan may also help with weight loss. What are tips for following this plan? General guidelines  Avoid eating more than 2,300 mg (milligrams) of salt (sodium) a day. If you have hypertension, you may need to reduce your sodium intake to 1,500 mg a day.  Limit alcohol intake to no more than 1 drink a day for nonpregnant women and 2 drinks a day for men. One drink equals 12 oz of beer, 5 oz of wine, or 1 oz of hard liquor.  Work with your health care provider to maintain a healthy body weight or to lose weight. Ask what an ideal weight is for you.  Get at least 30 minutes of exercise that causes your heart to beat faster (aerobic exercise) most days of the week. Activities may include walking, swimming, or biking.  Work with your health care provider or diet and nutrition specialist (dietitian) to adjust your eating plan to your individual calorie needs. Reading food labels  Check food labels for the amount of sodium per serving. Choose foods with less than 5 percent of the Daily Value of sodium. Generally, foods with less than 300 mg of sodium per serving fit into this eating plan.  To find whole grains, look for the word "whole" as the first word in the ingredient list. Shopping  Buy products labeled as "low-sodium" or "no salt added."  Buy fresh foods. Avoid canned foods and premade or frozen meals. Cooking  Avoid adding salt when cooking. Use salt-free seasonings or herbs instead of table salt or sea salt. Check with your health care provider or pharmacist before using salt substitutes.  Do not fry foods. Cook foods using healthy methods such as baking, boiling, grilling, and broiling instead.  Cook with  heart-healthy oils, such as olive, canola, soybean, or sunflower oil. Meal planning   Eat a balanced diet that includes: ? 5 or more servings of fruits and vegetables each day. At each meal, try to fill half of your plate with fruits and vegetables. ? Up to 6-8 servings of whole grains each day. ? Less than 6 oz of lean meat, poultry, or fish each day. A 3-oz serving of meat is about the same size as a deck of cards. One egg equals 1 oz. ? 2 servings of low-fat dairy each day. ? A serving of nuts, seeds, or beans 5 times each week. ? Heart-healthy fats. Healthy fats called Omega-3 fatty acids are found in foods such as flaxseeds and coldwater fish, like sardines, salmon, and mackerel.  Limit how much you eat of the following: ? Canned or prepackaged foods. ? Food that is high in trans fat, such as fried foods. ? Food that is high in saturated fat, such as fatty meat. ? Sweets, desserts, sugary drinks, and other foods with added sugar. ? Full-fat dairy products.  Do not salt foods before eating.  Try to eat at least 2 vegetarian meals each week.  Eat more home-cooked food and less restaurant, buffet, and fast food.  When eating at a restaurant, ask that your food be prepared with less salt or no salt, if possible. What foods are recommended? The items listed may not be a complete list. Talk with your dietitian about what   dietary choices are best for you. Grains Whole-grain or whole-wheat bread. Whole-grain or whole-wheat pasta. Brown rice. Oatmeal. Quinoa. Bulgur. Whole-grain and low-sodium cereals. Pita bread. Low-fat, low-sodium crackers. Whole-wheat flour tortillas. Vegetables Fresh or frozen vegetables (raw, steamed, roasted, or grilled). Low-sodium or reduced-sodium tomato and vegetable juice. Low-sodium or reduced-sodium tomato sauce and tomato paste. Low-sodium or reduced-sodium canned vegetables. Fruits All fresh, dried, or frozen fruit. Canned fruit in natural juice (without  added sugar). Meat and other protein foods Skinless chicken or turkey. Ground chicken or turkey. Pork with fat trimmed off. Fish and seafood. Egg whites. Dried beans, peas, or lentils. Unsalted nuts, nut butters, and seeds. Unsalted canned beans. Lean cuts of beef with fat trimmed off. Low-sodium, lean deli meat. Dairy Low-fat (1%) or fat-free (skim) milk. Fat-free, low-fat, or reduced-fat cheeses. Nonfat, low-sodium ricotta or cottage cheese. Low-fat or nonfat yogurt. Low-fat, low-sodium cheese. Fats and oils Soft margarine without trans fats. Vegetable oil. Low-fat, reduced-fat, or light mayonnaise and salad dressings (reduced-sodium). Canola, safflower, olive, soybean, and sunflower oils. Avocado. Seasoning and other foods Herbs. Spices. Seasoning mixes without salt. Unsalted popcorn and pretzels. Fat-free sweets. What foods are not recommended? The items listed may not be a complete list. Talk with your dietitian about what dietary choices are best for you. Grains Baked goods made with fat, such as croissants, muffins, or some breads. Dry pasta or rice meal packs. Vegetables Creamed or fried vegetables. Vegetables in a cheese sauce. Regular canned vegetables (not low-sodium or reduced-sodium). Regular canned tomato sauce and paste (not low-sodium or reduced-sodium). Regular tomato and vegetable juice (not low-sodium or reduced-sodium). Pickles. Olives. Fruits Canned fruit in a light or heavy syrup. Fried fruit. Fruit in cream or butter sauce. Meat and other protein foods Fatty cuts of meat. Ribs. Fried meat. Bacon. Sausage. Bologna and other processed lunch meats. Salami. Fatback. Hotdogs. Bratwurst. Salted nuts and seeds. Canned beans with added salt. Canned or smoked fish. Whole eggs or egg yolks. Chicken or turkey with skin. Dairy Whole or 2% milk, cream, and half-and-half. Whole or full-fat cream cheese. Whole-fat or sweetened yogurt. Full-fat cheese. Nondairy creamers. Whipped toppings.  Processed cheese and cheese spreads. Fats and oils Butter. Stick margarine. Lard. Shortening. Ghee. Bacon fat. Tropical oils, such as coconut, palm kernel, or palm oil. Seasoning and other foods Salted popcorn and pretzels. Onion salt, garlic salt, seasoned salt, table salt, and sea salt. Worcestershire sauce. Tartar sauce. Barbecue sauce. Teriyaki sauce. Soy sauce, including reduced-sodium. Steak sauce. Canned and packaged gravies. Fish sauce. Oyster sauce. Cocktail sauce. Horseradish that you find on the shelf. Ketchup. Mustard. Meat flavorings and tenderizers. Bouillon cubes. Hot sauce and Tabasco sauce. Premade or packaged marinades. Premade or packaged taco seasonings. Relishes. Regular salad dressings. Where to find more information:  National Heart, Lung, and Blood Institute: www.nhlbi.nih.gov  American Heart Association: www.heart.org Summary  The DASH eating plan is a healthy eating plan that has been shown to reduce high blood pressure (hypertension). It may also reduce your risk for type 2 diabetes, heart disease, and stroke.  With the DASH eating plan, you should limit salt (sodium) intake to 2,300 mg a day. If you have hypertension, you may need to reduce your sodium intake to 1,500 mg a day.  When on the DASH eating plan, aim to eat more fresh fruits and vegetables, whole grains, lean proteins, low-fat dairy, and heart-healthy fats.  Work with your health care provider or diet and nutrition specialist (dietitian) to adjust your eating plan to your individual   calorie needs. This information is not intended to replace advice given to you by your health care provider. Make sure you discuss any questions you have with your health care provider. Document Released: 10/22/2011 Document Revised: 10/26/2016 Document Reviewed: 10/26/2016 Elsevier Interactive Patient Education  2017 Elsevier Inc.  

## 2017-04-14 LAB — BASIC METABOLIC PANEL
BUN/Creatinine Ratio: 11 (ref 9–23)
BUN: 10 mg/dL (ref 6–20)
CALCIUM: 9.2 mg/dL (ref 8.7–10.2)
CHLORIDE: 99 mmol/L (ref 96–106)
CO2: 22 mmol/L (ref 18–29)
Creatinine, Ser: 0.88 mg/dL (ref 0.57–1.00)
GFR, EST AFRICAN AMERICAN: 101 mL/min/{1.73_m2} (ref >59)
GFR, EST NON AFRICAN AMERICAN: 87 mL/min/{1.73_m2} (ref >59)
Glucose: 72 mg/dL (ref 65–99)
Potassium: 4.4 mmol/L (ref 3.5–5.2)
Sodium: 136 mmol/L (ref 134–144)

## 2017-04-14 NOTE — Progress Notes (Signed)
BP 112/79   Pulse 84   Temp 98.2 F (36.8 C) (Oral)   Ht 5\' 2"  (1.575 m)   Wt 259 lb 3.2 oz (117.6 kg)   BMI 47.41 kg/m    Subjective:    Patient ID: Jacqueline Peterson, female    DOB: [birth year], 31 y.o.   MRN: [id number]  HPI: Jacqueline Peterson is a 31 y.o. female presenting on 04/13/2017 for Follow-up (Discuss Holter monitor results and BP med)  This patient comes in for periodic recheck on medications and conditions including hypertension, palpitations, current UTI. Patient states that she has had more UTIs ever since starting the amlodipine, totaling 6 in the past 6 months. She had studied about the medication and it does have urinary tract infection as a possible side effect. He gets appropriate for [place] to stop this medication and plan on a different medication. We will use lisinopril. If the urinary tract infections do not get better we will plan for urology evaluation. The patient understands this. We have also discussed her Holter monitor results. They showed a few episodes of supraventricular tachycardia and tachycardia. There were no other abnormality seen on the report. The report will be scanned into the chart..   All medications are reviewed today. There are no reports of any problems with the medications. All of the medical conditions are reviewed and updated.  Lab work is reviewed and will be ordered as medically necessary. There are no new problems reported with today's visit.   Relevant past medical, surgical, family and social history reviewed and updated as indicated. Allergies and medications reviewed and updated.  Past Medical History:  Diagnosis Date  . History of chicken pox   . HSV infection   . Hypothyroidism     Past Surgical History:  Procedure Laterality Date  . CESAREAN SECTION N/A 07/16/2016   Procedure: CESAREAN SECTION;  Surgeon: Chancy Milroy, MD;  Location: [facility];  Service: Obstetrics;  Laterality: N/A;  . NO PAST SURGERIES       Review of Systems  Constitutional: Negative.  Negative for activity change, fatigue and fever.  HENT: Negative.   Eyes: Negative.   Respiratory: Negative.  Negative for cough.   Cardiovascular: Positive for palpitations. Negative for chest pain.  Gastrointestinal: Negative.  Negative for abdominal pain.  Endocrine: Negative.   Genitourinary: Positive for frequency. Negative for dysuria.  Musculoskeletal: Negative.   Skin: Negative.   Neurological: Negative.     Allergies as of 04/13/2017      Reactions   Saxenda [liraglutide -weight Management]    nausea      Medication List       Accurate as of 04/13/17 11:59 PM. Always use your most recent med list.          ALPRAZolam 0.25 MG tablet Commonly known as:  XANAX Take 1 tablet (0.25 mg total) by mouth at bedtime as needed for anxiety or sleep.   buPROPion 300 MG 24 hr tablet Commonly known as:  WELLBUTRIN XL Take 1 tablet (300 mg total) by mouth daily.   levonorgestrel-ethinyl estradiol 0.15-0.03 MG tablet Commonly known as:  SEASONALE,INTROVALE,JOLESSA Take 1 tablet by mouth daily.   levothyroxine 75 MCG tablet Commonly known as:  SYNTHROID, LEVOTHROID TAKE 1 TABLET (75 MCG TOTAL) BY MOUTH DAILY.   lisinopril 10 MG tablet Commonly known as:  PRINIVIL,ZESTRIL Take 1 tablet (10 mg total) by mouth daily.   Melatonin 3 MG Tabs Take by mouth.   nitrofurantoin (macrocrystal-monohydrate) 100  MG capsule Commonly known as:  MACROBID   pantoprazole 40 MG tablet Commonly known as:  PROTONIX Take 1 tablet (40 mg total) by mouth daily.   prenatal multivitamin Tabs tablet Take 1 tablet by mouth daily at 12 noon.   valACYclovir 1000 MG tablet Commonly known as:  VALTREX Take 1 tablet (1,000 mg total) by mouth daily.   ZYRTEC PO Take 1 tablet by mouth daily.          Objective:    BP 112/79   Pulse 84   Temp 98.2 F (36.8 C) (Oral)   Ht 5\' 2"  (1.575 m)   Wt 259 lb 3.2 oz (117.6 kg)   BMI 47.41 kg/m    Allergies  Allergen Reactions  . Saxenda [Liraglutide -Weight Management]     nausea    Physical Exam  Constitutional: She is oriented to person, place, and time. She appears well-developed and well-nourished.  HENT:  Head: Normocephalic and atraumatic.  Right Ear: Tympanic membrane, external ear and ear canal normal.  Left Ear: Tympanic membrane, external ear and ear canal normal.  Nose: Nose normal. No rhinorrhea.  Mouth/Throat: Oropharynx is clear and moist and mucous membranes are normal. No oropharyngeal exudate or posterior oropharyngeal erythema.  Eyes: Conjunctivae and EOM are normal. Pupils are equal, round, and reactive to light.  Neck: Normal range of motion. Neck supple.  Cardiovascular: Normal rate, regular rhythm, normal heart sounds and intact distal pulses.   Pulmonary/Chest: Effort normal and breath sounds normal.  Abdominal: Soft. Bowel sounds are normal.  Neurological: She is alert and oriented to person, place, and time. She has normal reflexes.  Skin: Skin is warm and dry. No rash noted.  Psychiatric: She has a normal mood and affect. Her behavior is normal. Judgment and thought content normal.  Nursing note and vitals reviewed.   Results for orders placed or performed in visit on 25/63/89  Basic Metabolic Panel  Result Value Ref Range   Glucose 72 65 - 99 mg/dL   BUN 10 6 - 20 mg/dL   Creatinine, Ser 0.88 0.57 - 1.00 mg/dL   GFR calc non Af Amer 87 >59 mL/min/1.73   GFR calc Af Amer 101 >59 mL/min/1.73   BUN/Creatinine Ratio 11 9 - 23   Sodium 136 134 - 144 mmol/L   Potassium 4.4 3.5 - 5.2 mmol/L   Chloride 99 96 - 106 mmol/L   CO2 22 18 - 29 mmol/L   Calcium 9.2 8.7 - 10.2 mg/dL      Assessment & Plan:   1. Essential hypertension - lisinopril (PRINIVIL,ZESTRIL) 10 MG tablet; Take 1 tablet (10 mg total) by mouth daily.  Dispense: 30 tablet; Refill: 0 - Basic Metabolic Panel  2. Ventricular ectopy Holter monitor was found to have a few episodes  of supraventricular tachycardia and tachycardia. No other abnormalities were seen. Report will be scanned into chart.  3. Recurrent UTI - nitrofurantoin, macrocrystal-monohydrate, (MACROBID) 100 MG capsule;  If urinary tract infections continue despite change in medication we will plan for urology evaluation to study urodynamics.   Current Outpatient Prescriptions:  .  ALPRAZolam (XANAX) 0.25 MG tablet, Take 1 tablet (0.25 mg total) by mouth at bedtime as needed for anxiety or sleep., Disp: 30 tablet, Rfl: 0 .  buPROPion (WELLBUTRIN XL) 300 MG 24 hr tablet, Take 1 tablet (300 mg total) by mouth daily., Disp: 90 tablet, Rfl: 3 .  Cetirizine HCl (ZYRTEC PO), Take 1 tablet by mouth daily. , Disp: , Rfl:  .  levonorgestrel-ethinyl estradiol (SEASONALE,INTROVALE,JOLESSA) 0.15-0.03 MG tablet, Take 1 tablet by mouth daily., Disp: 1 Package, Rfl: 4 .  levothyroxine (SYNTHROID, LEVOTHROID) 75 MCG tablet, TAKE 1 TABLET (75 MCG TOTAL) BY MOUTH DAILY., Disp: 90 tablet, Rfl: 3 .  Melatonin 3 MG TABS, Take by mouth., Disp: , Rfl:  .  pantoprazole (PROTONIX) 40 MG tablet, Take 1 tablet (40 mg total) by mouth daily., Disp: 30 tablet, Rfl: 0 .  Prenatal Vit-Fe Fumarate-FA (PRENATAL MULTIVITAMIN) TABS tablet, Take 1 tablet by mouth daily at 12 noon., Disp: , Rfl:  .  valACYclovir (VALTREX) 1000 MG tablet, Take 1 tablet (1,000 mg total) by mouth daily., Disp: 90 tablet, Rfl: 3 .  lisinopril (PRINIVIL,ZESTRIL) 10 MG tablet, Take 1 tablet (10 mg total) by mouth daily., Disp: 30 tablet, Rfl: 0 .  nitrofurantoin, macrocrystal-monohydrate, (MACROBID) 100 MG capsule, , Disp: , Rfl:   Continue all other maintenance medications as listed above.  Follow up plan: Return in about 4 weeks (around 05/11/2017) for recheck.  Educational handout given for DASH eating  Terald Sleeper PA-C [facility] [address]  [place], [place] [address] [phone]   04/14/2017, 1:36 PM

## 2017-05-04 ENCOUNTER — Ambulatory Visit (INDEPENDENT_AMBULATORY_CARE_PROVIDER_SITE_OTHER): Payer: 59 | Admitting: Physician Assistant

## 2017-05-04 ENCOUNTER — Encounter: Payer: Self-pay | Admitting: Physician Assistant

## 2017-05-04 VITALS — BP 110/73 | HR 76 | Temp 99.1°F | Ht 62.0 in | Wt 259.8 lb

## 2017-05-04 DIAGNOSIS — E039 Hypothyroidism, unspecified: Secondary | ICD-10-CM

## 2017-05-04 DIAGNOSIS — I10 Essential (primary) hypertension: Secondary | ICD-10-CM | POA: Diagnosis not present

## 2017-05-04 MED ORDER — TOPIRAMATE 50 MG PO TABS: 50.0000 mg | ORAL_TABLET | Freq: Every day | ORAL | 3 refills | Status: DC

## 2017-05-04 MED ORDER — LISINOPRIL 10 MG PO TABS: 10.0000 mg | ORAL_TABLET | Freq: Every day | ORAL | 3 refills | Status: DC

## 2017-05-04 MED FILL — TOPIRAMATE 50 MG TABLET: 50 | 30 days supply | Qty: 60 | Fill #0

## 2017-05-05 ENCOUNTER — Other Ambulatory Visit: Payer: Self-pay | Admitting: Physician Assistant

## 2017-05-05 LAB — TSH: TSH: 7.38 u[IU]/mL — AB (ref 0.450–4.500)

## 2017-05-07 ENCOUNTER — Other Ambulatory Visit: Payer: Self-pay | Admitting: Physician Assistant

## 2017-05-07 MED ORDER — LEVOTHYROXINE SODIUM 100 MCG PO TABS: ORAL_TABLET | ORAL | 0 refills | Status: DC

## 2017-05-07 MED FILL — LISINOPRIL 10 MG TABLET: 10 | 90 days supply | Qty: 90 | Fill #0

## 2017-05-07 MED FILL — LEVOTHYROXINE 100 MCG TAB: 100 | 90 days supply | Qty: 90 | Fill #0

## 2017-05-07 NOTE — Progress Notes (Signed)
BP 110/73   Pulse 76   Temp 99.1 F (37.3 C) (Oral)   Ht 5\' 2"  (1.575 m)   Wt 259 lb 12.8 oz (117.8 kg)   BMI 47.52 kg/m    Subjective:    Patient ID: Jacqueline Peterson, female    DOB: 01/15/1986, 31 y.o.   MRN: [id number]  HPI: Jacqueline Peterson is a 31 y.o. female presenting on 05/04/2017 for Hypertension (4 week rck)  This patient comes in for periodic recheck on medications and conditions including Hypertension, hypothyroidism, weight gain. She is due lab said a evaluate her thyroid. We will have this performed. Refills will be sent. She reports doing very well with her current medications.   All medications are reviewed today. There are no reports of any problems with the medications. All of the medical conditions are reviewed and updated.  Lab work is reviewed and will be ordered as medically necessary. There are no new problems reported with today's visit.  Relevant past medical, surgical, family and social history reviewed and updated as indicated. Allergies and medications reviewed and updated.  Past Medical History:  Diagnosis Date  . History of chicken pox   . HSV infection   . Hypothyroidism     Past Surgical History:  Procedure Laterality Date  . CESAREAN SECTION N/A 07/16/2016   Procedure: CESAREAN SECTION;  Surgeon: Chancy Milroy, MD;  Location: [facility];  Service: Obstetrics;  Laterality: N/A;  . NO PAST SURGERIES      Review of Systems  Constitutional: Negative.  Negative for activity change, fatigue and fever.  HENT: Negative.   Eyes: Negative.   Respiratory: Negative.  Negative for cough.   Cardiovascular: Negative.  Negative for chest pain.  Gastrointestinal: Negative.  Negative for abdominal pain.  Endocrine: Negative.   Genitourinary: Negative.  Negative for dysuria.  Musculoskeletal: Negative.   Skin: Negative.   Neurological: Negative.     Allergies as of 05/04/2017      Reactions   Saxenda [liraglutide -weight Management]     nausea      Medication List       Accurate as of 05/04/17 11:59 PM. Always use your most recent med list.          ALPRAZolam 0.25 MG tablet Commonly known as:  XANAX Take 1 tablet (0.25 mg total) by mouth at bedtime as needed for anxiety or sleep.   buPROPion 300 MG 24 hr tablet Commonly known as:  WELLBUTRIN XL Take 1 tablet (300 mg total) by mouth daily.   levonorgestrel-ethinyl estradiol 0.15-0.03 MG tablet Commonly known as:  SEASONALE,INTROVALE,JOLESSA Take 1 tablet by mouth daily.   levothyroxine 75 MCG tablet Commonly known as:  SYNTHROID, LEVOTHROID TAKE 1 TABLET (75 MCG TOTAL) BY MOUTH DAILY.   lisinopril 10 MG tablet Commonly known as:  PRINIVIL,ZESTRIL Take 1 tablet (10 mg total) by mouth daily.   Melatonin 3 MG Tabs Take by mouth.   pantoprazole 40 MG tablet Commonly known as:  PROTONIX Take 1 tablet (40 mg total) by mouth daily.   prenatal multivitamin Tabs tablet Take 1 tablet by mouth daily at 12 noon.   topiramate 50 MG tablet Commonly known as:  TOPAMAX Take 1-2 tablets (50-100 mg total) by mouth daily.   valACYclovir 1000 MG tablet Commonly known as:  VALTREX Take 1 tablet (1,000 mg total) by mouth daily.   ZYRTEC PO Take 1 tablet by mouth daily.          Objective:  BP 110/73   Pulse 76   Temp 99.1 F (37.3 C) (Oral)   Ht 5\' 2"  (1.575 m)   Wt 259 lb 12.8 oz (117.8 kg)   BMI 47.52 kg/m   Allergies  Allergen Reactions  . Saxenda [Liraglutide -Weight Management]     nausea    Physical Exam  Constitutional: She is oriented to person, place, and time. She appears well-developed and well-nourished.  HENT:  Head: Normocephalic and atraumatic.  Right Ear: Tympanic membrane, external ear and ear canal normal.  Left Ear: Tympanic membrane, external ear and ear canal normal.  Nose: Nose normal. No rhinorrhea.  Mouth/Throat: Oropharynx is clear and moist and mucous membranes are normal. No oropharyngeal exudate or posterior  oropharyngeal erythema.  Eyes: Conjunctivae and EOM are normal. Pupils are equal, round, and reactive to light.  Neck: Normal range of motion. Neck supple.  Cardiovascular: Normal rate, regular rhythm, normal heart sounds and intact distal pulses.   Pulmonary/Chest: Effort normal and breath sounds normal.  Abdominal: Soft. Bowel sounds are normal.  Neurological: She is alert and oriented to person, place, and time. She has normal reflexes.  Skin: Skin is warm and dry. No rash noted.  Psychiatric: She has a normal mood and affect. Her behavior is normal. Judgment and thought content normal.  Nursing note and vitals reviewed.   Results for orders placed or performed in visit on 05/04/17  TSH  Result Value Ref Range   TSH 7.380 (H) 0.450 - 4.500 uIU/mL      Assessment & Plan:   1. Essential hypertension - lisinopril (PRINIVIL,ZESTRIL) 10 MG tablet; Take 1 tablet (10 mg total) by mouth daily.  Dispense: 90 tablet; Refill: 3  2. Hypothyroidism, unspecified type - TSH  3. Morbid obesity (HCC) - topiramate (TOPAMAX) 50 MG tablet; Take 1-2 tablets (50-100 mg total) by mouth daily.  Dispense: 60 tablet; Refill: 3   Continue all other maintenance medications as listed above.  Follow up plan: Recheck 6 months or prn   Educational handout given for [place] PA-C [facility] [address]  [place], [place] [address] [phone]   05/07/2017, 8:02 AM

## 2017-05-07 NOTE — Patient Instructions (Addendum)
In a few days you may receive a survey in the mail or online from [employer] regarding your visit with us today. Please take a moment to fill this out. Your feedback is very important to our whole office. It can help us better understand your needs as well as improve your experience and satisfaction. Thank you for taking your time to complete it. We care about you.  Jennifr Gaeta, PA-C  

## 2017-05-10 MED FILL — BUPROPION HCL XL 300 MG TAB: 300 | 90 days supply | Qty: 90 | Fill #1

## 2017-05-26 MED FILL — PANTOPRAZOLE SOD DR 40 MG T: 40 | 30 days supply | Qty: 30 | Fill #0

## 2017-05-26 MED FILL — INTROVALE 0.15-0.03 MG TAB: 0.15-0.03 | 90 days supply | Qty: 91 | Fill #3

## 2017-05-26 MED FILL — valACYclovir HCL 1 GM TABS: 1 | 90 days supply | Qty: 90 | Fill #2

## 2017-06-27 ENCOUNTER — Other Ambulatory Visit (HOSPITAL_COMMUNITY): Payer: Self-pay | Admitting: Obstetrics and Gynecology

## 2017-06-29 ENCOUNTER — Other Ambulatory Visit: Payer: Self-pay | Admitting: Obstetrics and Gynecology

## 2017-06-29 MED ORDER — PANTOPRAZOLE SODIUM 40 MG PO TBEC: 40.0000 mg | DELAYED_RELEASE_TABLET | Freq: Every day | ORAL | 6 refills | Status: DC

## 2017-06-29 MED FILL — PANTOPRAZOLE SOD DR 40 MG T: 40 | 30 days supply | Qty: 30 | Fill #0

## 2017-06-29 NOTE — Progress Notes (Signed)
Pantoprazole 40 refilled x 6 months

## 2017-07-13 ENCOUNTER — Ambulatory Visit (INDEPENDENT_AMBULATORY_CARE_PROVIDER_SITE_OTHER): Payer: 59 | Admitting: Physician Assistant

## 2017-07-13 ENCOUNTER — Encounter: Payer: Self-pay | Admitting: Physician Assistant

## 2017-07-13 VITALS — BP 115/79 | HR 84 | Temp 98.6°F | Ht 62.0 in | Wt 261.2 lb

## 2017-07-13 DIAGNOSIS — H9193 Unspecified hearing loss, bilateral: Secondary | ICD-10-CM

## 2017-07-13 DIAGNOSIS — H9313 Tinnitus, bilateral: Secondary | ICD-10-CM | POA: Insufficient documentation

## 2017-07-13 DIAGNOSIS — E039 Hypothyroidism, unspecified: Secondary | ICD-10-CM

## 2017-07-13 NOTE — Progress Notes (Signed)
BP 115/79   Pulse 84   Temp 98.6 F (37 C) (Oral)   Ht 5\' 2"  (1.575 m)   Wt 261 lb 3.2 oz (118.5 kg)   BMI 47.77 kg/m    Subjective:    Patient ID: Jacqueline Peterson, female    DOB: [birth year], 31 y.o.   MRN: [id number]  HPI: Jacqueline Peterson is a 31 y.o. female presenting on 07/13/2017 for Tinnitus (x 3 months )  Patient has experienced increased tinnitus over the past few months. It is gone on for about 3 months. When it is quiet she can hear it more. She has hearing changes that come and go. It will only last for a few seconds. He will sound like the ear turned off. She has never had any audiology damage that she is aware of. Her thyroid was off 2 months ago, we will recheck her TSH today.  Relevant past medical, surgical, family and social history reviewed and updated as indicated. Allergies and medications reviewed and updated.  Past Medical History:  Diagnosis Date  . History of chicken pox   . HSV infection   . Hypothyroidism     Past Surgical History:  Procedure Laterality Date  . CESAREAN SECTION N/A 07/16/2016   Procedure: CESAREAN SECTION;  Surgeon: Chancy Milroy, MD;  Location: [facility];  Service: Obstetrics;  Laterality: N/A;  . NO PAST SURGERIES      Review of Systems  Constitutional: Negative.   HENT: Positive for hearing loss and tinnitus.   Eyes: Negative.   Respiratory: Negative.   Cardiovascular: Negative.   Gastrointestinal: Negative.   Genitourinary: Negative.   Musculoskeletal: Negative.   Skin: Negative.     Allergies as of 07/13/2017      Reactions   Saxenda [liraglutide -weight Management]    nausea   Topamax [topiramate] Other (See Comments)   Confusion       Medication List       Accurate as of 07/13/17  3:39 PM. Always use your most recent med list.          ALPRAZolam 0.25 MG tablet Commonly known as:  XANAX Take 1 tablet (0.25 mg total) by mouth at bedtime as needed for anxiety or sleep.   buPROPion 300  MG 24 hr tablet Commonly known as:  WELLBUTRIN XL Take 1 tablet (300 mg total) by mouth daily.   Fish Oil 1000 MG Caps Take by mouth.   FLAX SEED OIL PO Take by mouth.   levonorgestrel-ethinyl estradiol 0.15-0.03 MG tablet Commonly known as:  SEASONALE,INTROVALE,JOLESSA Take 1 tablet by mouth daily.   levothyroxine 100 MCG tablet Commonly known as:  SYNTHROID, LEVOTHROID TAKE 1 TABLET (75 MCG TOTAL) BY MOUTH DAILY.   lisinopril 10 MG tablet Commonly known as:  PRINIVIL,ZESTRIL Take 1 tablet (10 mg total) by mouth daily.   Melatonin 3 MG Tabs Take by mouth.   pantoprazole 40 MG tablet Commonly known as:  PROTONIX Take 1 tablet (40 mg total) by mouth daily.   prenatal multivitamin Tabs tablet Take 1 tablet by mouth daily at 12 noon.   valACYclovir 1000 MG tablet Commonly known as:  VALTREX Take 1 tablet (1,000 mg total) by mouth daily.   ZYRTEC PO Take 1 tablet by mouth daily.            Discharge Care Instructions        Start     Ordered   07/13/17 0000  TSH     07/13/17  1500   07/13/17 0000  Ambulatory referral to Audiology     07/13/17 1504         Objective:    BP 115/79   Pulse 84   Temp 98.6 F (37 C) (Oral)   Ht 5\' 2"  (1.575 m)   Wt 261 lb 3.2 oz (118.5 kg)   BMI 47.77 kg/m   Allergies  Allergen Reactions  . Saxenda [Liraglutide -Weight Management]     nausea  . Topamax [Topiramate] Other (See Comments)    Confusion      Physical Exam  Constitutional: She is oriented to person, place, and time. She appears well-developed and well-nourished.  HENT:  Head: Normocephalic and atraumatic.  Right Ear: Tympanic membrane, external ear and ear canal normal. Tympanic membrane is not scarred and not bulging.  Left Ear: Tympanic membrane, external ear and ear canal normal. Tympanic membrane is not scarred and not bulging.  Nose: Nose normal. No rhinorrhea.  Mouth/Throat: Oropharynx is clear and moist and mucous membranes are normal. No  oropharyngeal exudate or posterior oropharyngeal erythema.  Negative Weber and Rinne test  Eyes: Pupils are equal, round, and reactive to light. Conjunctivae and EOM are normal.  Neck: Normal range of motion. Neck supple.  Cardiovascular: Normal rate, regular rhythm, normal heart sounds and intact distal pulses.   Pulmonary/Chest: Effort normal and breath sounds normal.  Abdominal: Soft. Bowel sounds are normal.  Neurological: She is alert and oriented to person, place, and time. She has normal reflexes.  Skin: Skin is warm and dry. No rash noted.  Psychiatric: She has a normal mood and affect. Her behavior is normal. Judgment and thought content normal.  Nursing note and vitals reviewed.   Results for orders placed or performed in visit on 05/04/17  TSH  Result Value Ref Range   TSH 7.380 (H) 0.450 - 4.500 uIU/mL      Assessment & Plan:   1. Tinnitus of both ears - Ambulatory referral to Audiology  2. Hypothyroidism, unspecified type - TSH  3. Bilateral hearing loss, unspecified hearing loss type - Ambulatory referral to Audiology    Current Outpatient Prescriptions:  .  ALPRAZolam (XANAX) 0.25 MG tablet, Take 1 tablet (0.25 mg total) by mouth at bedtime as needed for anxiety or sleep., Disp: 30 tablet, Rfl: 0 .  buPROPion (WELLBUTRIN XL) 300 MG 24 hr tablet, Take 1 tablet (300 mg total) by mouth daily., Disp: 90 tablet, Rfl: 3 .  Cetirizine HCl (ZYRTEC PO), Take 1 tablet by mouth daily. , Disp: , Rfl:  .  Flaxseed, Linseed, (FLAX SEED OIL PO), Take by mouth., Disp: , Rfl:  .  levonorgestrel-ethinyl estradiol (SEASONALE,INTROVALE,JOLESSA) 0.15-0.03 MG tablet, Take 1 tablet by mouth daily., Disp: 1 Package, Rfl: 4 .  levothyroxine (SYNTHROID, LEVOTHROID) 100 MCG tablet, TAKE 1 TABLET (75 MCG TOTAL) BY MOUTH DAILY., Disp: 90 tablet, Rfl: 0 .  lisinopril (PRINIVIL,ZESTRIL) 10 MG tablet, Take 1 tablet (10 mg total) by mouth daily., Disp: 90 tablet, Rfl: 3 .  Melatonin 3 MG TABS,  Take by mouth., Disp: , Rfl:  .  Omega-3 Fatty Acids (FISH OIL) 1000 MG CAPS, Take by mouth., Disp: , Rfl:  .  pantoprazole (PROTONIX) 40 MG tablet, Take 1 tablet (40 mg total) by mouth daily., Disp: 30 tablet, Rfl: 6 .  Prenatal Vit-Fe Fumarate-FA (PRENATAL MULTIVITAMIN) TABS tablet, Take 1 tablet by mouth daily at 12 noon., Disp: , Rfl:  .  valACYclovir (VALTREX) 1000 MG tablet, Take 1 tablet (1,000 mg  total) by mouth daily., Disp: 90 tablet, Rfl: 3 Continue all other maintenance medications as listed above.  Follow up plan: Return if symptoms worsen or fail to improve.  Educational handout given for tinnitus  Terald Sleeper PA-C [facility] [address]  [place], [place] [address] [phone]   07/13/2017, 3:39 PM

## 2017-07-13 NOTE — Patient Instructions (Signed)
Tinnitus Tinnitus refers to hearing a sound when there is no actual source for that sound. This is often described as ringing in the ears. However, people with this condition may hear a variety of noises. A person may hear the sound in one ear or in both ears. The sounds of tinnitus can be soft, loud, or somewhere in between. Tinnitus can last for a few seconds or can be constant for days. It may go away without treatment and come back at various times. When tinnitus is constant or happens often, it can lead to other problems, such as trouble sleeping and trouble concentrating. Almost everyone experiences tinnitus at some point. Tinnitus that is long-lasting (chronic) or comes back often is a problem that may require medical attention. What are the causes? The cause of tinnitus is often not known. In some cases, it can result from other problems or conditions, including:  Exposure to loud noises from machinery, music, or other sources.  Hearing loss.  Ear or sinus infections.  Earwax buildup.  A foreign object in the ear.  Use of certain medicines.  Use of alcohol and caffeine.  High blood pressure.  Heart diseases.  Anemia.  Allergies.  Meniere disease.  Thyroid problems.  Tumors.  An enlarged part of a weakened blood vessel (aneurysm).  What are the signs or symptoms? The main symptom of tinnitus is hearing a sound when there is no source for that sound. It may sound like:  Buzzing.  Roaring.  Ringing.  Blowing air, similar to the sound heard when you listen to a seashell.  Hissing.  Whistling.  Sizzling.  Humming.  Running water.  A sustained musical note.  How is this diagnosed? Tinnitus is diagnosed based on your symptoms. Your health care provider will do a physical exam. A comprehensive hearing exam (audiologic exam) will be done if your tinnitus:  Affects only one ear (unilateral).  Causes hearing difficulties.  Lasts 6 months or  longer.  You may also need to see a health care provider who specializes in hearing disorders (audiologist). You may be asked to complete a questionnaire to determine the severity of your tinnitus. Tests may be done to help determine the cause and to rule out other conditions. These can include:  Imaging studies of your head and brain, such as: ? A CT scan. ? An MRI.  An imaging study of your blood vessels (angiogram).  How is this treated? Treating an underlying medical condition can sometimes make tinnitus go away. If your tinnitus continues, other treatments may include:  Medicines, such as certain antidepressants or sleeping aids.  Sound generators to mask the tinnitus. These include: ? Tabletop sound machines that play relaxing sounds to help you fall asleep. ? Wearable devices that fit in your ear and play sounds or music. ? A small device that uses headphones to deliver a signal embedded in music (acoustic neural stimulation). In time, this may change the pathways of your brain and make you less sensitive to tinnitus. This device is used for very severe cases when no other treatment is working.  Therapy and counseling to help you manage the stress of living with tinnitus.  Using hearing aids or cochlear implants, if your tinnitus is related to hearing loss.  Follow these instructions at home:  When possible, avoid being in loud places and being exposed to loud sounds.  Wear hearing protection, such as earplugs, when you are exposed to loud noises.  Do not take stimulants, such as nicotine,   alcohol, or caffeine.  Practice techniques for reducing stress, such as meditation, yoga, or deep breathing.  Use a white noise machine, a humidifier, or other devices to mask the sound of tinnitus.  Sleep with your head slightly raised. This may reduce the impact of tinnitus.  Try to get plenty of rest each night. Contact a health care provider if:  You have tinnitus in just one  ear.  Your tinnitus continues for 3 weeks or longer without stopping.  Home care measures are not helping.  You have tinnitus after a head injury.  You have tinnitus along with any of the following: ? Dizziness. ? Loss of balance. ? Nausea and vomiting. This information is not intended to replace advice given to you by your health care provider. Make sure you discuss any questions you have with your health care provider. Document Released: 11/02/2005 Document Revised: 07/05/2016 Document Reviewed: 04/04/2014 Elsevier Interactive Patient Education  2018 Elsevier Inc.  

## 2017-07-14 LAB — TSH: TSH: 1.84 u[IU]/mL (ref 0.450–4.500)

## 2017-07-26 MED FILL — PANTOPRAZOLE SOD DR 40 MG T: 40 | 30 days supply | Qty: 30 | Fill #1

## 2017-08-01 DIAGNOSIS — N912 Amenorrhea, unspecified: Secondary | ICD-10-CM | POA: Diagnosis not present

## 2017-08-01 DIAGNOSIS — I10 Essential (primary) hypertension: Secondary | ICD-10-CM | POA: Diagnosis not present

## 2017-08-01 DIAGNOSIS — Z87440 Personal history of urinary (tract) infections: Secondary | ICD-10-CM | POA: Diagnosis not present

## 2017-08-01 DIAGNOSIS — M545 Low back pain: Secondary | ICD-10-CM | POA: Diagnosis not present

## 2017-08-03 DIAGNOSIS — S39012S Strain of muscle, fascia and tendon of lower back, sequela: Secondary | ICD-10-CM | POA: Diagnosis not present

## 2017-08-03 DIAGNOSIS — Z3200 Encounter for pregnancy test, result unknown: Secondary | ICD-10-CM | POA: Diagnosis not present

## 2017-08-03 DIAGNOSIS — M545 Low back pain: Secondary | ICD-10-CM | POA: Diagnosis not present

## 2017-08-03 DIAGNOSIS — M25551 Pain in right hip: Secondary | ICD-10-CM | POA: Diagnosis not present

## 2017-08-03 DIAGNOSIS — R739 Hyperglycemia, unspecified: Secondary | ICD-10-CM | POA: Diagnosis not present

## 2017-08-03 MED FILL — BACLOFEN 10 MG TABLET: 10 | 10 days supply | Qty: 14 | Fill #0

## 2017-08-03 MED FILL — predniSONE 5 MG TABS: 5 | 6 days supply | Qty: 21 | Fill #0

## 2017-08-08 ENCOUNTER — Other Ambulatory Visit: Payer: Self-pay | Admitting: Physician Assistant

## 2017-08-09 MED ORDER — LEVOTHYROXINE SODIUM 100 MCG PO TABS: ORAL_TABLET | ORAL | 0 refills | Status: DC

## 2017-08-09 MED FILL — BUPROPION HCL XL 300 MG TAB: 300 | 90 days supply | Qty: 90 | Fill #2

## 2017-08-09 MED FILL — LEVOTHYROXINE 100 MCG TAB: 100 | 90 days supply | Qty: 90 | Fill #0

## 2017-08-09 MED FILL — LISINOPRIL 10 MG TABLET: 10 | 90 days supply | Qty: 90 | Fill #1

## 2017-08-17 DIAGNOSIS — N390 Urinary tract infection, site not specified: Secondary | ICD-10-CM | POA: Diagnosis not present

## 2017-08-17 MED FILL — CIPROFLOXACIN HCL 500 MG TA: 500 | 5 days supply | Qty: 10 | Fill #0

## 2017-08-25 MED FILL — SETLAKIN 0.15 MG-0.03 MG TA: 0.15-0.03 | 90 days supply | Qty: 91 | Fill #4

## 2017-08-25 MED FILL — PANTOPRAZOLE SOD DR 40 MG T: 40 | 90 days supply | Qty: 90 | Fill #2

## 2017-08-25 MED FILL — valACYclovir HCL 1 GM TABS: 1 | 90 days supply | Qty: 90 | Fill #3

## 2017-09-14 ENCOUNTER — Encounter: Payer: Self-pay | Admitting: Physician Assistant

## 2017-09-14 ENCOUNTER — Ambulatory Visit (INDEPENDENT_AMBULATORY_CARE_PROVIDER_SITE_OTHER): Payer: 59 | Admitting: Physician Assistant

## 2017-09-14 VITALS — BP 102/68 | HR 89 | Temp 100.2°F | Ht 62.0 in | Wt 264.0 lb

## 2017-09-14 DIAGNOSIS — Z01419 Encounter for gynecological examination (general) (routine) without abnormal findings: Secondary | ICD-10-CM

## 2017-09-14 DIAGNOSIS — L02419 Cutaneous abscess of limb, unspecified: Secondary | ICD-10-CM

## 2017-09-14 MED ORDER — LEVONORGEST-ETH ESTRAD 91-DAY 0.15-0.03 MG PO TABS: 1.0000 | ORAL_TABLET | Freq: Every day | ORAL | 4 refills | Status: DC

## 2017-09-14 MED ORDER — SULFAMETHOXAZOLE-TRIMETHOPRIM 800-160 MG PO TABS: 1.0000 | ORAL_TABLET | Freq: Two times a day (BID) | ORAL | 3 refills | Status: DC

## 2017-09-14 MED FILL — SULFAMETHOXAZOLE-TMP DS TAB: 800-160 | 10 days supply | Qty: 20 | Fill #0

## 2017-09-14 NOTE — Progress Notes (Signed)
BP 102/68   Pulse 89   Temp 100.2 F (37.9 C) (Oral)   Ht _0  (1.575 m)   Wt 264 lb (119.7 kg)   LMP 08/16/2017 (Approximate)   BMI 48.29 kg/m    Subjective:    Patient ID: Jacqueline Peterson, female    DOB: [birth year], 31 y.o.   MRN: [id number]  HPI: CAMELIA STELZNER is a 31 y.o. female presenting on 09/14/2017 for Gynecologic Exam  This patient comes in for annual well physical examination. All medications are reviewed today. There are no reports of any problems with the medications. All of the medical conditions are reviewed and updated.  Lab work is reviewed and will be ordered as medically necessary. There are no new problems reported with today's visit.  Patient reports doing well overall.  A small abscesses started back under her right axilla.  She has had this problem in the past.  She has never been diagnosed with hidradenitis suppurativa.  Relevant past medical, surgical, family and social history reviewed and updated as indicated. Allergies and medications reviewed and updated.  Past Medical History:  Diagnosis Date  . History of chicken pox   . HSV infection   . Hypothyroidism     Past Surgical History:  Procedure Laterality Date  . CESAREAN SECTION N/A 07/16/2016   Procedure: CESAREAN SECTION;  Surgeon: Chancy Milroy, MD;  Location: [facility];  Service: Obstetrics;  Laterality: N/A;  . NO PAST SURGERIES      Review of Systems  Constitutional: Negative.  Negative for activity change, fatigue and fever.  HENT: Negative.   Eyes: Negative.   Respiratory: Negative.  Negative for cough.   Cardiovascular: Negative.  Negative for chest pain.  Gastrointestinal: Negative.  Negative for abdominal pain.  Endocrine: Negative.   Genitourinary: Negative.  Negative for dysuria.  Musculoskeletal: Negative.   Skin: Positive for wound.  Neurological: Negative.     Allergies as of 09/14/2017      Reactions   Saxenda [liraglutide -weight Management]      nausea   Topamax [topiramate] Other (See Comments)   Confusion       Medication List       Accurate as of 09/14/17  3:49 PM. Always use your most recent med list.          ALPRAZolam 0.25 MG tablet Commonly known as:  XANAX Take 1 tablet (0.25 mg total) by mouth at bedtime as needed for anxiety or sleep.   aspirin EC 81 MG tablet Take 81 mg by mouth daily.   buPROPion 300 MG 24 hr tablet Commonly known as:  WELLBUTRIN XL Take 1 tablet (300 mg total) by mouth daily.   CALCIUM 500 + D PO Take 1 capsule by mouth daily.   Fish Oil 1000 MG Caps Take by mouth.   FLAX SEED OIL PO Take 1,000 mg by mouth every morning.   levonorgestrel-ethinyl estradiol 0.15-0.03 MG tablet Commonly known as:  SEASONALE,INTROVALE,JOLESSA Take 1 tablet by mouth daily.   levothyroxine 100 MCG tablet Commonly known as:  SYNTHROID, LEVOTHROID TAKE 1 TABLET (75 MCG TOTAL) BY MOUTH DAILY.   lisinopril 10 MG tablet Commonly known as:  PRINIVIL,ZESTRIL Take 1 tablet (10 mg total) by mouth daily.   Melatonin 3 MG Tabs Take by mouth.   pantoprazole 40 MG tablet Commonly known as:  PROTONIX Take 1 tablet (40 mg total) by mouth daily.   prenatal multivitamin Tabs tablet Take 1 tablet by mouth daily at 12  noon.   Probiotic Caps Take 1 capsule by mouth daily.   sulfamethoxazole-trimethoprim 800-160 MG tablet Commonly known as:  BACTRIM DS,SEPTRA DS Take 1 tablet by mouth 2 (two) times daily.   valACYclovir 1000 MG tablet Commonly known as:  VALTREX Take 1 tablet (1,000 mg total) by mouth daily.   ZYRTEC PO Take 1 tablet by mouth daily.          Objective:    BP 102/68   Pulse 89   Temp 100.2 F (37.9 C) (Oral)   Ht _0  (1.575 m)   Wt 264 lb (119.7 kg)   LMP 08/16/2017 (Approximate)   BMI 48.29 kg/m   Allergies  Allergen Reactions  . Saxenda [Liraglutide -Weight Management]     nausea  . Topamax [Topiramate] Other (See Comments)    Confusion      Physical Exam   Constitutional: She is oriented to person, place, and time. She appears well-developed and well-nourished.  HENT:  Head: Normocephalic and atraumatic.  Eyes: Pupils are equal, round, and reactive to light. Conjunctivae and EOM are normal.  Neck: Normal range of motion. Neck supple.  Cardiovascular: Normal rate, regular rhythm, normal heart sounds and intact distal pulses.   Pulmonary/Chest: Effort normal and breath sounds normal. Right breast exhibits no mass, no skin change and no tenderness. Left breast exhibits no mass, no skin change and no tenderness. Breasts are symmetrical.  Abdominal: Soft. Bowel sounds are normal.  Genitourinary: Vagina normal and uterus normal. Rectal exam shows no fissure. No breast swelling, tenderness, discharge or bleeding. There is no tenderness or lesion on the right labia. There is no tenderness or lesion on the left labia. Uterus is not deviated, not enlarged and not tender. Cervix exhibits no motion tenderness, no discharge and no friability. Right adnexum displays no mass, no tenderness and no fullness. Left adnexum displays no mass, no tenderness and no fullness. No tenderness or bleeding in the vagina. No vaginal discharge found.  Neurological: She is alert and oriented to person, place, and time. She has normal reflexes.  Skin: Skin is warm and dry. Lesion and rash noted. Rash is pustular. There is erythema.     Psychiatric: She has a normal mood and affect. Her behavior is normal. Judgment and thought content normal.    Results for orders placed or performed in visit on 07/13/17  TSH  Result Value Ref Range   TSH 1.840 0.450 - 4.500 uIU/mL      Assessment & Plan:   1. Well female exam with routine gynecological exam - IGP, Aptima HPV, rfx 16/18,45 - levonorgestrel-ethinyl estradiol (SEASONALE,INTROVALE,JOLESSA) 0.15-0.03 MG tablet; Take 1 tablet by mouth daily.  Dispense: 3 Package; Refill: 4 - CBC with Differential/Platelet - CMP14+EGFR - Lipid  panel - TSH  2. Abscess of axilla - sulfamethoxazole-trimethoprim (BACTRIM DS,SEPTRA DS) 800-160 MG tablet; Take 1 tablet by mouth 2 (two) times daily.  Dispense: 20 tablet; Refill: 3    Current Outpatient Prescriptions:  .  ALPRAZolam (XANAX) 0.25 MG tablet, Take 1 tablet (0.25 mg total) by mouth at bedtime as needed for anxiety or sleep., Disp: 30 tablet, Rfl: 0 .  aspirin EC 81 MG tablet, Take 81 mg by mouth daily., Disp: , Rfl:  .  buPROPion (WELLBUTRIN XL) 300 MG 24 hr tablet, Take 1 tablet (300 mg total) by mouth daily., Disp: 90 tablet, Rfl: 3 .  Calcium Carbonate-Vitamin D (CALCIUM 500 + D PO), Take 1 capsule by mouth daily., Disp: , Rfl:  .  Cetirizine HCl (ZYRTEC PO), Take 1 tablet by mouth daily. , Disp: , Rfl:  .  Flaxseed, Linseed, (FLAX SEED OIL PO), Take 1,000 mg by mouth every morning. , Disp: , Rfl:  .  levonorgestrel-ethinyl estradiol (SEASONALE,INTROVALE,JOLESSA) 0.15-0.03 MG tablet, Take 1 tablet by mouth daily., Disp: 3 Package, Rfl: 4 .  levothyroxine (SYNTHROID, LEVOTHROID) 100 MCG tablet, TAKE 1 TABLET (75 MCG TOTAL) BY MOUTH DAILY., Disp: 90 tablet, Rfl: 0 .  lisinopril (PRINIVIL,ZESTRIL) 10 MG tablet, Take 1 tablet (10 mg total) by mouth daily., Disp: 90 tablet, Rfl: 3 .  Melatonin 3 MG TABS, Take by mouth., Disp: , Rfl:  .  Omega-3 Fatty Acids (FISH OIL) 1000 MG CAPS, Take by mouth., Disp: , Rfl:  .  pantoprazole (PROTONIX) 40 MG tablet, Take 1 tablet (40 mg total) by mouth daily., Disp: 30 tablet, Rfl: 6 .  Prenatal Vit-Fe Fumarate-FA (PRENATAL MULTIVITAMIN) TABS tablet, Take 1 tablet by mouth daily at 12 noon., Disp: , Rfl:  .  Probiotic CAPS, Take 1 capsule by mouth daily., Disp: , Rfl:  .  sulfamethoxazole-trimethoprim (BACTRIM DS,SEPTRA DS) 800-160 MG tablet, Take 1 tablet by mouth 2 (two) times daily., Disp: 20 tablet, Rfl: 3 .  valACYclovir (VALTREX) 1000 MG tablet, Take 1 tablet (1,000 mg total) by mouth daily., Disp: 90 tablet, Rfl: 3 Continue all other  maintenance medications as listed above.  Follow up plan: Return in about 1 year (around 09/14/2018), or if symptoms worsen or fail to improve, for annual.  Educational handout given for [place] PA-C [facility] [address]  [place], [place] [address] [phone]   09/14/2017, 3:49 PM

## 2017-09-14 NOTE — Patient Instructions (Signed)
In a few days you may receive a survey in the mail or online from [employer] regarding your visit with us today. Please take a moment to fill this out. Your feedback is very important to our whole office. It can help us better understand your needs as well as improve your experience and satisfaction. Thank you for taking your time to complete it. We care about you.  Jerren Flinchbaugh, PA-C  

## 2017-09-15 ENCOUNTER — Encounter: Payer: Self-pay | Admitting: Physician Assistant

## 2017-09-15 LAB — CBC WITH DIFFERENTIAL/PLATELET
BASOS ABS: 0 10*3/uL (ref 0.0–0.2)
Basos: 0 %
EOS (ABSOLUTE): 0.1 10*3/uL (ref 0.0–0.4)
EOS: 1 %
Hematocrit: 41.7 % (ref 34.0–46.6)
Hemoglobin: 13.5 g/dL (ref 11.1–15.9)
IMMATURE GRANS (ABS): 0 10*3/uL (ref 0.0–0.1)
Immature Granulocytes: 0 %
LYMPHS ABS: 2.9 10*3/uL (ref 0.7–3.1)
Lymphs: 23 %
MCH: 29.5 pg (ref 26.6–33.0)
MCHC: 32.4 g/dL (ref 31.5–35.7)
MCV: 91 fL (ref 79–97)
MONOS ABS: 0.7 10*3/uL (ref 0.1–0.9)
Monocytes: 5 %
NEUTROS PCT: 71 %
Neutrophils Absolute: 9.1 10*3/uL — ABNORMAL HIGH (ref 1.4–7.0)
PLATELETS: 349 10*3/uL (ref 150–379)
RBC: 4.58 x10E6/uL (ref 3.77–5.28)
RDW: 12.8 % (ref 12.3–15.4)
WBC: 12.8 10*3/uL — AB (ref 3.4–10.8)

## 2017-09-15 LAB — CMP14+EGFR
A/G RATIO: 1.8 (ref 1.2–2.2)
ALBUMIN: 4.6 g/dL (ref 3.5–5.5)
ALK PHOS: 76 IU/L (ref 39–117)
ALT: 18 IU/L (ref 0–32)
AST: 16 IU/L (ref 0–40)
BUN / CREAT RATIO: 10 (ref 9–23)
BUN: 10 mg/dL (ref 6–20)
Bilirubin Total: 0.4 mg/dL (ref 0.0–1.2)
CHLORIDE: 96 mmol/L (ref 96–106)
CO2: 22 mmol/L (ref 20–29)
Calcium: 9.5 mg/dL (ref 8.7–10.2)
Creatinine, Ser: 1.03 mg/dL — ABNORMAL HIGH (ref 0.57–1.00)
GFR calc non Af Amer: 72 mL/min/{1.73_m2} (ref >59)
GFR, EST AFRICAN AMERICAN: 83 mL/min/{1.73_m2} (ref >59)
GLUCOSE: 75 mg/dL (ref 65–99)
Globulin, Total: 2.6 g/dL (ref 1.5–4.5)
POTASSIUM: 4.6 mmol/L (ref 3.5–5.2)
SODIUM: 133 mmol/L — AB (ref 134–144)
TOTAL PROTEIN: 7.2 g/dL (ref 6.0–8.5)

## 2017-09-15 LAB — LIPID PANEL
CHOL/HDL RATIO: 6.6 ratio — AB (ref 0.0–4.4)
CHOLESTEROL TOTAL: 271 mg/dL — AB (ref 100–199)
HDL: 41 mg/dL (ref >39)
LDL Calculated: 200 mg/dL — ABNORMAL HIGH (ref 0–99)
Triglycerides: 148 mg/dL (ref 0–149)
VLDL Cholesterol Cal: 30 mg/dL (ref 5–40)

## 2017-09-15 LAB — TSH: TSH: 2.57 u[IU]/mL (ref 0.450–4.500)

## 2017-09-16 LAB — IGP, APTIMA HPV, RFX 16/18,45
HPV Aptima: NEGATIVE
PAP SMEAR COMMENT: 0

## 2017-09-22 ENCOUNTER — Encounter: Payer: Self-pay | Admitting: Physician Assistant

## 2017-09-22 ENCOUNTER — Other Ambulatory Visit: Payer: Self-pay | Admitting: Physician Assistant

## 2017-09-22 MED ORDER — ATORVASTATIN CALCIUM 10 MG PO TABS: 10.0000 mg | ORAL_TABLET | Freq: Every day | ORAL | 3 refills | Status: DC

## 2017-09-22 MED FILL — ATORVASTATIN 10 MG TABLET: 10 | 90 days supply | Qty: 90 | Fill #0

## 2017-10-12 DIAGNOSIS — L02431 Carbuncle of right axilla: Secondary | ICD-10-CM | POA: Diagnosis not present

## 2017-10-12 MED FILL — SULFAMETHOXAZOLE-TMP DS TAB: 800-160 | 10 days supply | Qty: 20 | Fill #1

## 2017-10-14 DIAGNOSIS — L02431 Carbuncle of right axilla: Secondary | ICD-10-CM | POA: Diagnosis not present

## 2017-10-19 ENCOUNTER — Ambulatory Visit (INDEPENDENT_AMBULATORY_CARE_PROVIDER_SITE_OTHER): Payer: 59 | Admitting: Physician Assistant

## 2017-10-19 ENCOUNTER — Encounter: Payer: Self-pay | Admitting: Physician Assistant

## 2017-10-19 VITALS — BP 135/85 | HR 84 | Temp 97.3°F | Ht 62.0 in | Wt 266.0 lb

## 2017-10-19 DIAGNOSIS — L732 Hidradenitis suppurativa: Secondary | ICD-10-CM | POA: Insufficient documentation

## 2017-10-19 DIAGNOSIS — R739 Hyperglycemia, unspecified: Secondary | ICD-10-CM

## 2017-10-19 LAB — BAYER DCA HB A1C WAIVED: HB A1C (BAYER DCA - WAIVED): 5.2 % (ref <7.0)

## 2017-10-19 MED ORDER — CLINDAMYCIN HCL 300 MG PO CAPS: 300.0000 mg | ORAL_CAPSULE | Freq: Every day | ORAL | 3 refills | Status: DC

## 2017-10-19 MED FILL — CLINDAMYCIN HCL 300 MG CAPS: 300 | 90 days supply | Qty: 90 | Fill #0

## 2017-10-19 NOTE — Patient Instructions (Signed)
Hidradenitis Suppurativa Hidradenitis suppurativa is a long-term (chronic) skin disease that starts with blocked sweat glands or hair follicles. Bacteria may grow in these blocked openings of your skin. Hidradenitis suppurativa is like a severe form of acne that develops in areas of your body where acne would be unusual. It is most likely to affect the areas of your body where skin rubs against skin and becomes moist. This includes your:  Underarms.  Groin.  Genital areas.  Buttocks.  Upper thighs.  Breasts.  Hidradenitis suppurativa may start out with small pimples. The pimples can develop into deep sores that break open (rupture) and drain pus. Over time your skin may thicken and become scarred. Hidradenitis suppurativa cannot be passed from person to person. What are the causes? The exact cause of hidradenitis suppurativa is not known. This condition may be due to:  Female and female hormones. The condition is rare before and after puberty.  An overactive body defense system (immune system). Your immune system may overreact to the blocked hair follicles or sweat glands and cause swelling and pus-filled sores.  What increases the risk? You may have a higher risk of hidradenitis suppurativa if you:  Are a woman.  Are between ages 11 and 55.  Have a family history of hidradenitis suppurativa.  Have a personal history of acne.  Are overweight.  Smoke.  Take the drug lithium.  What are the signs or symptoms? The first signs of an outbreak are usually painful skin bumps that look like pimples. As the condition progresses:  Skin bumps may get bigger and grow deeper into the skin.  Bumps under the skin may rupture and drain smelly pus.  Skin may become itchy and infected.  Skin may thicken and scar.  Drainage may continue through tunnels under the skin (fistulas).  Walking and moving your arms can become painful.  How is this diagnosed? Your health care provider may  diagnose hidradenitis suppurativa based on your medical history and your signs and symptoms. A physical exam will also be done. You may need to see a health care provider who specializes in skin diseases (dermatologist). You may also have tests done to confirm the diagnosis. These can include:  Swabbing a sample of pus or drainage from your skin so it can be sent to the lab and tested for infection.  Blood tests to check for infection.  How is this treated? The same treatment will not work for everybody with hidradenitis suppurativa. Your treatment will depend on how severe your symptoms are. You may need to try several treatments to find what works best for you. Part of your treatment may include cleaning and bandaging (dressing) your wounds. You may also have to take medicines, such as the following:  Antibiotics.  Acne medicines.  Medicines to block or suppress the immune system.  A diabetes medicine (metformin) is sometimes used to treat this condition.  For women, birth control pills can sometimes help relieve symptoms.  You may need surgery if you have a severe case of hidradenitis suppurativa that does not respond to medicine. Surgery may involve:  Using a laser to clear the skin and remove hair follicles.  Opening and draining deep sores.  Removing the areas of skin that are diseased and scarred.  Follow these instructions at home:  Learn as much as you can about your disease, and work closely with your health care providers.  Take medicines only as directed by your health care provider.  If you were prescribed   an antibiotic medicine, finish it all even if you start to feel better.  If you are overweight, losing weight may be very helpful. Try to reach and maintain a healthy weight.  Do not use any tobacco products, including cigarettes, chewing tobacco, or electronic cigarettes. If you need help quitting, ask your health care provider.  Do not shave the areas where you  get hidradenitis suppurativa.  Do not wear deodorant.  Wear loose-fitting clothes.  Try not to overheat and get sweaty.  Take a daily bleach bath as directed by your health care provider. ? Fill your bathtub halfway with water. ? Pour in  cup of unscented household bleach. ? Soak for 5-10 minutes.  Cover sore areas with a warm, clean washcloth (compress) for 5-10 minutes. Contact a health care provider if:  You have a flare-up of hidradenitis suppurativa.  You have chills or a fever.  You are having trouble controlling your symptoms at home. This information is not intended to replace advice given to you by your health care provider. Make sure you discuss any questions you have with your health care provider. Document Released: 06/16/2004 Document Revised: 04/09/2016 Document Reviewed: 02/02/2014 Elsevier Interactive Patient Education  2018 [employer] Inc.  

## 2017-10-20 NOTE — Progress Notes (Signed)
BP 135/85   Pulse 84   Temp (!) 97.3 F (36.3 C) (Oral)   Ht 5\' 2"  (1.575 m)   Wt 266 lb (120.7 kg)   BMI 48.65 kg/m    Subjective:    Patient ID: Jacqueline Peterson, female    DOB: Jan 23, 1986, 31 y.o.   MRN: [id number]  HPI: Jacqueline Peterson is a 31 y.o. female presenting on 10/19/2017 for Cyst (Right Axillary)  Patient comes in to discuss her hidradenitis Partida.  She has never been on prevention medication before she has had 3 or 4 treatments of the area on her right axilla.  She does not know of any family history of this.  She is looking at long-term prevention if possible.  We also will do an A1c to see what her sugar status is like.  Relevant past medical, surgical, family and social history reviewed and updated as indicated. Allergies and medications reviewed and updated.  Past Medical History:  Diagnosis Date  . History of chicken pox   . HSV infection   . Hypothyroidism     Past Surgical History:  Procedure Laterality Date  . CESAREAN SECTION N/A 07/16/2016   Procedure: CESAREAN SECTION;  Surgeon: Chancy Milroy, MD;  Location: [facility];  Service: Obstetrics;  Laterality: N/A;  . NO PAST SURGERIES      Review of Systems  Constitutional: Negative.  Negative for activity change, fatigue and fever.  HENT: Negative.   Eyes: Negative.   Respiratory: Negative.  Negative for cough.   Cardiovascular: Negative.  Negative for chest pain.  Gastrointestinal: Negative.  Negative for abdominal pain.  Endocrine: Negative.   Genitourinary: Negative.  Negative for dysuria.  Musculoskeletal: Negative.   Skin: Positive for color change, rash and wound.  Neurological: Negative.     Allergies as of 10/19/2017      Reactions   Saxenda [liraglutide -weight Management]    nausea   Topamax [topiramate] Other (See Comments)   Confusion       Medication List        Accurate as of 10/19/17 11:59 PM. Always use your most recent med list.            ALPRAZolam 0.25 MG tablet Commonly known as:  XANAX Take 1 tablet (0.25 mg total) by mouth at bedtime as needed for anxiety or sleep.   aspirin EC 81 MG tablet Take 81 mg by mouth daily.   atorvastatin 10 MG tablet Commonly known as:  LIPITOR Take 1 tablet (10 mg total) daily by mouth.   buPROPion 300 MG 24 hr tablet Commonly known as:  WELLBUTRIN XL Take 1 tablet (300 mg total) by mouth daily.   CALCIUM 500 + D PO Take 1 capsule by mouth daily.   clindamycin 300 MG capsule Commonly known as:  CLEOCIN Take 1 capsule (300 mg total) by mouth daily.   Fish Oil 1000 MG Caps Take by mouth.   FLAX SEED OIL PO Take 1,000 mg by mouth every morning.   levonorgestrel-ethinyl estradiol 0.15-0.03 MG tablet Commonly known as:  SEASONALE,INTROVALE,JOLESSA Take 1 tablet by mouth daily.   levothyroxine 100 MCG tablet Commonly known as:  SYNTHROID, LEVOTHROID TAKE 1 TABLET (75 MCG TOTAL) BY MOUTH DAILY.   lisinopril 10 MG tablet Commonly known as:  PRINIVIL,ZESTRIL Take 1 tablet (10 mg total) by mouth daily.   Melatonin 3 MG Tabs Take by mouth.   pantoprazole 40 MG tablet Commonly known as:  PROTONIX Take 1  tablet (40 mg total) by mouth daily.   prenatal multivitamin Tabs tablet Take 1 tablet by mouth daily at 12 noon.   Probiotic Caps Take 1 capsule by mouth daily.   sulfamethoxazole-trimethoprim 800-160 MG tablet Commonly known as:  BACTRIM DS,SEPTRA DS Take 1 tablet by mouth 2 (two) times daily.   valACYclovir 1000 MG tablet Commonly known as:  VALTREX Take 1 tablet (1,000 mg total) by mouth daily.   ZYRTEC PO Take 1 tablet by mouth daily.          Objective:    BP 135/85   Pulse 84   Temp (!) 97.3 F (36.3 C) (Oral)   Ht 5\' 2"  (1.575 m)   Wt 266 lb (120.7 kg)   BMI 48.65 kg/m   Allergies  Allergen Reactions  . Saxenda [Liraglutide -Weight Management]     nausea  . Topamax [Topiramate] Other (See Comments)    Confusion      Physical Exam   Constitutional: She is oriented to person, place, and time. She appears well-developed and well-nourished.  HENT:  Head: Normocephalic and atraumatic.  Eyes: Conjunctivae and EOM are normal. Pupils are equal, round, and reactive to light.  Cardiovascular: Normal rate, regular rhythm, normal heart sounds and intact distal pulses.  Pulmonary/Chest: Effort normal and breath sounds normal.  Abdominal: Soft. Bowel sounds are normal.  Neurological: She is alert and oriented to person, place, and time. She has normal reflexes.  Skin: Skin is warm and dry. Rash noted. Rash is macular and nodular. There is erythema.     Psychiatric: She has a normal mood and affect. Her behavior is normal. Judgment and thought content normal.  Nursing note and vitals reviewed.   Results for orders placed or performed in visit on 10/19/17  Bayer DCA Hb A1c Waived  Result Value Ref Range   Bayer DCA Hb A1c Waived 5.2 <7.0 %      Assessment & Plan:   1. Suppurative hidradenitis - clindamycin (CLEOCIN) 300 MG capsule; Take 1 capsule (300 mg total) by mouth daily.  Dispense: 90 capsule; Refill: 3  2. Hyperglycemia, unspecified - Bayer DCA Hb A1c Waived    Current Outpatient Medications:  .  ALPRAZolam (XANAX) 0.25 MG tablet, Take 1 tablet (0.25 mg total) by mouth at bedtime as needed for anxiety or sleep., Disp: 30 tablet, Rfl: 0 .  aspirin EC 81 MG tablet, Take 81 mg by mouth daily., Disp: , Rfl:  .  atorvastatin (LIPITOR) 10 MG tablet, Take 1 tablet (10 mg total) daily by mouth., Disp: 90 tablet, Rfl: 3 .  buPROPion (WELLBUTRIN XL) 300 MG 24 hr tablet, Take 1 tablet (300 mg total) by mouth daily., Disp: 90 tablet, Rfl: 3 .  Calcium Carbonate-Vitamin D (CALCIUM 500 + D PO), Take 1 capsule by mouth daily., Disp: , Rfl:  .  Cetirizine HCl (ZYRTEC PO), Take 1 tablet by mouth daily. , Disp: , Rfl:  .  Flaxseed, Linseed, (FLAX SEED OIL PO), Take 1,000 mg by mouth every morning. , Disp: , Rfl:  .   levonorgestrel-ethinyl estradiol (SEASONALE,INTROVALE,JOLESSA) 0.15-0.03 MG tablet, Take 1 tablet by mouth daily., Disp: 3 Package, Rfl: 4 .  levothyroxine (SYNTHROID, LEVOTHROID) 100 MCG tablet, TAKE 1 TABLET (75 MCG TOTAL) BY MOUTH DAILY., Disp: 90 tablet, Rfl: 0 .  lisinopril (PRINIVIL,ZESTRIL) 10 MG tablet, Take 1 tablet (10 mg total) by mouth daily., Disp: 90 tablet, Rfl: 3 .  Melatonin 3 MG TABS, Take by mouth., Disp: , Rfl:  .  Omega-3 Fatty Acids (FISH OIL) 1000 MG CAPS, Take by mouth., Disp: , Rfl:  .  pantoprazole (PROTONIX) 40 MG tablet, Take 1 tablet (40 mg total) by mouth daily., Disp: 30 tablet, Rfl: 6 .  Prenatal Vit-Fe Fumarate-FA (PRENATAL MULTIVITAMIN) TABS tablet, Take 1 tablet by mouth daily at 12 noon., Disp: , Rfl:  .  Probiotic CAPS, Take 1 capsule by mouth daily., Disp: , Rfl:  .  sulfamethoxazole-trimethoprim (BACTRIM DS,SEPTRA DS) 800-160 MG tablet, Take 1 tablet by mouth 2 (two) times daily., Disp: 20 tablet, Rfl: 3 .  valACYclovir (VALTREX) 1000 MG tablet, Take 1 tablet (1,000 mg total) by mouth daily., Disp: 90 tablet, Rfl: 3 .  clindamycin (CLEOCIN) 300 MG capsule, Take 1 capsule (300 mg total) by mouth daily., Disp: 90 capsule, Rfl: 3 Continue all other maintenance medications as listed above.  Follow up plan: Return if symptoms worsen or fail to improve.  Educational handout given for hidradenitis suppurativa  Terald Sleeper PA-C [facility] [address]  [place], [place] [address] [phone]   10/20/2017, 9:44 AM

## 2017-11-08 MED FILL — BUPROPION HCL XL 300 MG TAB: 300 | 90 days supply | Qty: 90 | Fill #3

## 2017-11-08 MED FILL — LISINOPRIL 10 MG TABS: 10 | 90 days supply | Qty: 90 | Fill #2

## 2017-11-15 ENCOUNTER — Other Ambulatory Visit: Payer: Self-pay | Admitting: Physician Assistant

## 2017-11-17 MED ORDER — LEVOTHYROXINE SODIUM 100 MCG PO TABS: ORAL_TABLET | ORAL | 0 refills | Status: DC

## 2017-11-17 MED FILL — LEVOTHYROXINE 100 MCG TAB: 100 | 90 days supply | Qty: 90 | Fill #0

## 2017-11-22 ENCOUNTER — Other Ambulatory Visit: Payer: Self-pay | Admitting: Physician Assistant

## 2017-11-22 MED FILL — PANTOPRAZOLE SOD DR 40 MG T: 40 | 60 days supply | Qty: 60 | Fill #3

## 2017-11-23 ENCOUNTER — Ambulatory Visit: Payer: 59 | Attending: Physician Assistant | Admitting: Audiology

## 2017-11-23 DIAGNOSIS — R94120 Abnormal auditory function study: Secondary | ICD-10-CM | POA: Insufficient documentation

## 2017-11-23 DIAGNOSIS — R94128 Abnormal results of other function studies of ear and other special senses: Secondary | ICD-10-CM | POA: Diagnosis not present

## 2017-11-23 DIAGNOSIS — H9193 Unspecified hearing loss, bilateral: Secondary | ICD-10-CM | POA: Insufficient documentation

## 2017-11-23 DIAGNOSIS — H93299 Other abnormal auditory perceptions, unspecified ear: Secondary | ICD-10-CM | POA: Diagnosis not present

## 2017-11-23 DIAGNOSIS — Z01118 Encounter for examination of ears and hearing with other abnormal findings: Secondary | ICD-10-CM | POA: Insufficient documentation

## 2017-11-23 DIAGNOSIS — H9313 Tinnitus, bilateral: Secondary | ICD-10-CM | POA: Diagnosis not present

## 2017-11-23 NOTE — Procedures (Signed)
[facility] [place]  [place], [place] [address]  [phone]   Audiological Evaluation  Patient Name: Jacqueline Peterson  Status: Outpatient   DOB: June 17, 1986    Diagnosis: Bilateral tinnitus               MRN: [id number] Date:  11/23/2017     Referent: Terald Sleeper, PA-C  History: JEWELLE WHITNER was seen for an audiological evaluation.  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  She would like to rule out the possibility of tinnitus being caused by a family history of cancer. Accompanied by: Self Primary Concern: Bilateral tinnitus. Pain: N History of hearing problems: Y History of ear infections:  N History of ear surgery or "tubes" : N History of dizziness/vertigo:   N History of balance issues:  N Tinnitus: Non-bothersome, high-pitched tinnitus Sound sensitivity: N History of occupational noise exposure: N History of hypertension: Y History of diabetes:  N Family history of hearing loss:  N Other concerns: N   Evaluation: Conventional pure tone audiometry from 250Hz  - 8000Hz  with using insert earphones.  Hearing Thresholds: Right ear:  Thresholds of 10-50 dBHL  Left ear:    Thresholds of 10-60 dBHL Reliability is good. Speech reception levels (repeating words near threshold) using monitored live voice spondee word lists:  Right ear: 20 dBHL.  Left ear:  20 dBHL Word recognition (at comfortably loud volumes) using monitored live voice word lists at 60 dBHL, in quiet.  Right ear: 100%.  Left ear:   100% Word recognition in minimal background noise:  +5 dBHL  Right ear: 76%                              Left ear:  76%  Tympanometry (middle ear function) with ipsilateral and contralateral acoustic reflexes.  Right ear: Normal (Type A) with present acoustic reflex (85-90 dB) at 500-4000Hz .  Left  ear: Normal (Type A) with present acoustic reflex (85-90 dB) at 500-4000Hz . Distortion Product Otoacoustic Emissions (DPAOE's), a test of inner ear function was completed from 2000Hz  - 10,000Hz  bilaterally:  Right ear: Abnormal responses throughout the range. Left ear: Abnormal responses throughout the range.  Acoustic reflex decay is negative on the left side and negative on the right side using 1000Hz .  Tinnitus matching appeared to be approximately 4000Hz  using speech noise at 66 dBHL.  Dichotic digits revealed 70% on the left side and 95% on the right side.  Competing sentences was 90% on the left side and 100% on the right side.  CONCLUSION:      Batya has normal hearing between 250-2000Hz  sloping to a predominantly mild to moderate high frequency sensorineural hearing loss bilaterally, slightly poorer on the left side.  This amount of hearing loss could adversely affect speech communication at normal conversational speech levels.   Word recognition is excellent in the right and left ears in quiet at conversational speech levels bilaterally. In minimal background noise, word recognition drops to fair in the right and left ears. Lawanda K Pesci's scores on the left side are slightly abnormal for the auditory processing test battery, which is consistent with central auditory processing disorder, which may contribute to increased difficulty hearing when a competing message is present.  There is no sign of retrocochlear issues in either ear which was evaluated  because of the tinnitus.  The tinnitus is measured at conversational speech levels. Of concern is that the tinnitus started 6-12 months ago.  Close monitoring of hearing and tinnitus is recommended.  The test results were discussed and [name] counseled.   RECOMMENDATIONS: 1.   Monitor hearing closely with a repeat audiological evaluation in 6 months (earlier if there is any change in hearing or ear pressure).  This  appointment has been scheduled here on May 24, 2018 at 3:30 PM. 2. To minimize the adverse effects of tinnitus 1) avoid quiet  2) use noise maskers at home such as a sound machine, quiet music, a fan or other background noise at a volume just loud enough to mask the high pitched tinnitus. 3) If the tinnitus becomes more bothersome, adversely affecting your sleep or concentration, contact your physician. 3.  Strategies that help improve hearing include: A) Face the speaker directly. Optimal is having the speakers face well - lit.  Unless amplified, being within 3-6 feet of the speaker will enhance word recognition. B) Avoid having the speaker back-lit as this will minimize the ability to use cues from lip-reading, facial expression and gestures. C)  Word recognition is poorer in background noise. For optimal word recognition, turn off the TV, radio or noisy fan when engaging in conversation. In a restaurant, try to sit away from noise sources and close to the primary speaker.  D)  Ask for topic clarification from time to time in order to remain in the conversation.  Most people don't mind repeating or clarifying a point when asked.  If needed, explain the difficulty hearing in background noise or hearing loss. 4.   Use hearing protection during noisy activities such as using a weed eater, moving the lawn, shooting, etc.    Musician's plugs, are available from [employer] for music related hearing protection because there is no distortion.  Other hearing protection, such as sponge plugs (available at pharmacies) or earmuffs (available at sporting goods stores or department stores such as Paediatric nurse) are useful for noisy activities and venues. 5.   Consider an amplified stethoscope.   Miles Costain. Buccini, B.A. Audiology Doctoral Student Clinician  Hopewell Heide Spark, Au.D., CCC-A Doctor of Audiology 11/23/2017   cc: Terald Sleeper, PA-C

## 2017-11-25 MED FILL — SETLAKIN 0.15 MG-0.03 MG TA: 0.15-0.03 | 91 days supply | Qty: 91 | Fill #0

## 2017-11-28 ENCOUNTER — Encounter: Payer: Self-pay | Admitting: Physician Assistant

## 2017-11-28 DIAGNOSIS — B009 Herpesviral infection, unspecified: Secondary | ICD-10-CM

## 2017-11-29 MED ORDER — VALACYCLOVIR HCL 1 G PO TABS: 1000.0000 mg | ORAL_TABLET | Freq: Every day | ORAL | 3 refills | Status: DC

## 2017-11-29 MED FILL — valACYclovir HCL 1 GM TABS: 1 | 90 days supply | Qty: 90 | Fill #0

## 2018-01-03 MED FILL — ATORVASTATIN 10 MG TABLET: 10 | 90 days supply | Qty: 90 | Fill #1

## 2018-01-17 MED FILL — CLINDAMYCIN HCL 300 MG CAP: 300 | 90 days supply | Qty: 90 | Fill #1

## 2018-01-30 ENCOUNTER — Encounter: Payer: Self-pay | Admitting: Physician Assistant

## 2018-01-31 ENCOUNTER — Other Ambulatory Visit: Payer: Self-pay | Admitting: *Deleted

## 2018-01-31 MED ORDER — PANTOPRAZOLE SODIUM 40 MG PO TBEC: 40.0000 mg | DELAYED_RELEASE_TABLET | Freq: Every day | ORAL | 3 refills | Status: DC

## 2018-01-31 MED FILL — PANTOPRAZOLE SOD DR 40 MG T: 40 | 90 days supply | Qty: 90 | Fill #0

## 2018-02-07 ENCOUNTER — Other Ambulatory Visit: Payer: Self-pay | Admitting: Physician Assistant

## 2018-02-07 MED FILL — LISINOPRIL 10 MG TABLET: 10 | 90 days supply | Qty: 90 | Fill #3

## 2018-02-07 MED FILL — BUPROPION HCL XL 300 MG TAB: 300 | 90 days supply | Qty: 90 | Fill #0

## 2018-02-07 NOTE — Telephone Encounter (Signed)
OV 02/15/18

## 2018-02-15 ENCOUNTER — Encounter: Payer: Self-pay | Admitting: Physician Assistant

## 2018-02-15 ENCOUNTER — Ambulatory Visit (INDEPENDENT_AMBULATORY_CARE_PROVIDER_SITE_OTHER): Payer: 59 | Admitting: Physician Assistant

## 2018-02-15 VITALS — BP 111/78 | HR 83 | Temp 97.8°F | Ht 62.0 in | Wt 271.8 lb

## 2018-02-15 DIAGNOSIS — L732 Hidradenitis suppurativa: Secondary | ICD-10-CM | POA: Diagnosis not present

## 2018-02-15 DIAGNOSIS — I10 Essential (primary) hypertension: Secondary | ICD-10-CM

## 2018-02-15 DIAGNOSIS — E039 Hypothyroidism, unspecified: Secondary | ICD-10-CM

## 2018-02-15 MED ORDER — PRAVASTATIN SODIUM 20 MG PO TABS: 20.0000 mg | ORAL_TABLET | Freq: Every day | ORAL | 3 refills | Status: DC

## 2018-02-15 MED FILL — PRAVASTATIN NA 20 MG TAB: 20 | 90 days supply | Qty: 90 | Fill #0

## 2018-02-16 ENCOUNTER — Other Ambulatory Visit: Payer: Self-pay | Admitting: Physician Assistant

## 2018-02-16 LAB — CMP14+EGFR
A/G RATIO: 1.4 (ref 1.2–2.2)
ALT: 13 IU/L (ref 0–32)
AST: 15 IU/L (ref 0–40)
Albumin: 4.2 g/dL (ref 3.5–5.5)
Alkaline Phosphatase: 68 IU/L (ref 39–117)
BILIRUBIN TOTAL: 0.5 mg/dL (ref 0.0–1.2)
BUN/Creatinine Ratio: 8 — ABNORMAL LOW (ref 9–23)
BUN: 8 mg/dL (ref 6–20)
CO2: 21 mmol/L (ref 20–29)
Calcium: 9.7 mg/dL (ref 8.7–10.2)
Chloride: 102 mmol/L (ref 96–106)
Creatinine, Ser: 0.97 mg/dL (ref 0.57–1.00)
GFR calc Af Amer: 89 mL/min/{1.73_m2} (ref >59)
GFR, EST NON AFRICAN AMERICAN: 77 mL/min/{1.73_m2} (ref >59)
Globulin, Total: 2.9 g/dL (ref 1.5–4.5)
Glucose: 71 mg/dL (ref 65–99)
POTASSIUM: 4.7 mmol/L (ref 3.5–5.2)
Sodium: 139 mmol/L (ref 134–144)
Total Protein: 7.1 g/dL (ref 6.0–8.5)

## 2018-02-16 LAB — TSH: TSH: 5.09 u[IU]/mL — ABNORMAL HIGH (ref 0.450–4.500)

## 2018-02-16 MED ORDER — LEVOTHYROXINE SODIUM 112 MCG PO TABS: ORAL_TABLET | ORAL | 3 refills | Status: DC

## 2018-02-16 MED FILL — LEVOTHYROXINE 112 MCG TAB: 112 | 90 days supply | Qty: 90 | Fill #0

## 2018-02-16 NOTE — Progress Notes (Signed)
BP 111/78   Pulse 83   Temp 97.8 F (36.6 C) (Oral)   Ht 5' 2"  (1.575 m)   Wt 271 lb 12.8 oz (123.3 kg)   BMI 49.71 kg/m     Subjective:    Patient ID: Jacqueline Peterson, female    DOB: 02-Aug-1986, 32 y.o.   MRN: [id number]  HPI: Jacqueline Peterson is a 32 y.o. female presenting on 02/15/2018 for Follow-up (Medication refill )  Patient comes in for recheck on her medications and conditions.  She does have hidradenitis suppurativa which is being well controlled on the regular clindamycin dosing.  Her tinnitus is still there.  She has seen an audiologist and will go back this summer for recheck.  Her depression is fairly well controlled.  She states that she is under a lot of stress right now with some family issues but does feel the medication is doing well.  Her blood pressure is well controlled on her current medications.  She did note aching with the Lipitor.  When she stopped it it did get better and she did do one more trial of taking it and I gave her the same arthralgias.  She did not try it taken out with coenzyme Q 10, so she is going to try this 1 more time.  If it is not beneficial she is going to start pravastatin at 20 mg.  We discussed that there was a very low dose in a bit of help on her cholesterol would be beneficial. Depression screen [facility] 2/9 02/15/2018 10/19/2017 09/14/2017 07/13/2017 05/04/2017  Decreased Interest 1 0 1 0 0  Down, Depressed, Hopeless 1 0 1 0 0  PHQ - 2 Score 2 0 2 0 0  Altered sleeping 2 - 1 - -  Tired, decreased energy 2 - 1 - -  Change in appetite 3 - 2 - -  Feeling bad or failure about yourself  0 - 0 - -  Trouble concentrating 1 - 0 - -  Moving slowly or fidgety/restless 0 - 0 - -  Suicidal thoughts 0 - 0 - -  PHQ-9 Score 10 - 6 - -  Difficult doing work/chores - - Not difficult at all - -    Past Medical History:  Diagnosis Date  . History of chicken pox   . HSV infection   . Hypothyroidism    Relevant past medical, surgical, family  and social history reviewed and updated as indicated. Interim medical history since our last visit reviewed. Allergies and medications reviewed and updated. DATA REVIEWED: CHART IN EPIC  Family History reviewed for pertinent findings.  Review of Systems  Constitutional: Negative.  Negative for activity change, fatigue and fever.  HENT: Negative.   Eyes: Negative.   Respiratory: Negative.  Negative for cough.   Cardiovascular: Negative.  Negative for chest pain.  Gastrointestinal: Negative.  Negative for abdominal pain.  Endocrine: Negative.   Genitourinary: Negative.  Negative for dysuria.  Musculoskeletal: Negative.   Skin: Negative.  Negative for color change, rash and wound.  Neurological: Negative.   Psychiatric/Behavioral: Positive for decreased concentration, dysphoric mood and sleep disturbance.    Allergies as of 02/15/2018      Reactions   Atorvastatin Other (See Comments)   Myalgias    Saxenda [liraglutide -weight Management]    nausea   Topamax [topiramate] Other (See Comments)   Confusion       Medication List        Accurate as of  02/15/18 11:59 PM. Always use your most recent med list.          ALPRAZolam 0.25 MG tablet Commonly known as:  XANAX Take 1 tablet (0.25 mg total) by mouth at bedtime as needed for anxiety or sleep.   aspirin EC 81 MG tablet Take 81 mg by mouth daily.   buPROPion 300 MG 24 hr tablet Commonly known as:  WELLBUTRIN XL TAKE 1 TABLET (300 MG TOTAL) BY MOUTH DAILY.   clindamycin 300 MG capsule Commonly known as:  CLEOCIN Take 1 capsule (300 mg total) by mouth daily.   levonorgestrel-ethinyl estradiol 0.15-0.03 MG tablet Commonly known as:  SEASONALE,INTROVALE,JOLESSA Take 1 tablet by mouth daily.   levothyroxine 100 MCG tablet Commonly known as:  SYNTHROID, LEVOTHROID TAKE 1 TABLET (75 MCG TOTAL) BY MOUTH DAILY.   lisinopril 10 MG tablet Commonly known as:  PRINIVIL,ZESTRIL Take 1 tablet (10 mg total) by mouth daily.     Melatonin 3 MG Tabs Take by mouth.   pantoprazole 40 MG tablet Commonly known as:  PROTONIX Take 1 tablet (40 mg total) by mouth daily.   pravastatin 20 MG tablet Commonly known as:  PRAVACHOL Take 1 tablet (20 mg total) by mouth daily.   prenatal multivitamin Tabs tablet Take 1 tablet by mouth daily at 12 noon.   Probiotic Caps Take 1 capsule by mouth daily.   valACYclovir 1000 MG tablet Commonly known as:  VALTREX Take 1 tablet (1,000 mg total) by mouth daily.   ZYRTEC PO Take 1 tablet by mouth daily.          Objective:    BP 111/78   Pulse 83   Temp 97.8 F (36.6 C) (Oral)   Ht 5' 2"  (1.575 m)   Wt 271 lb 12.8 oz (123.3 kg)   BMI 49.71 kg/m    Allergies  Allergen Reactions  . Atorvastatin Other (See Comments)    Myalgias   . Saxenda [Liraglutide -Weight Management]     nausea  . Topamax [Topiramate] Other (See Comments)    Confusion      Wt Readings from Last 3 Encounters:  02/15/18 271 lb 12.8 oz (123.3 kg)  10/19/17 266 lb (120.7 kg)  09/14/17 264 lb (119.7 kg)    Physical Exam  Constitutional: She is oriented to person, place, and time. She appears well-developed and well-nourished.  HENT:  Head: Normocephalic and atraumatic.  Eyes: Pupils are equal, round, and reactive to light. Conjunctivae and EOM are normal.  Cardiovascular: Normal rate, regular rhythm, normal heart sounds and intact distal pulses.  Pulmonary/Chest: Effort normal and breath sounds normal.  Abdominal: Soft. Bowel sounds are normal.  Neurological: She is alert and oriented to person, place, and time. She has normal reflexes.  Skin: Skin is warm and dry. No rash noted.  Psychiatric: She has a normal mood and affect. Her behavior is normal. Judgment and thought content normal.  Nursing note and vitals reviewed.   Results for orders placed or performed in visit on 02/15/18  CMP14+EGFR  Result Value Ref Range   Glucose 71 65 - 99 mg/dL   BUN 8 6 - 20 mg/dL   Creatinine,  Ser 0.97 0.57 - 1.00 mg/dL   GFR calc non Af Amer 77 >59 mL/min/1.73   GFR calc Af Amer 89 >59 mL/min/1.73   BUN/Creatinine Ratio 8 (L) 9 - 23   Sodium 139 134 - 144 mmol/L   Potassium 4.7 3.5 - 5.2 mmol/L   Chloride 102 96 - 106  mmol/L   CO2 21 20 - 29 mmol/L   Calcium 9.7 8.7 - 10.2 mg/dL   Total Protein 7.1 6.0 - 8.5 g/dL   Albumin 4.2 3.5 - 5.5 g/dL   Globulin, Total 2.9 1.5 - 4.5 g/dL   Albumin/Globulin Ratio 1.4 1.2 - 2.2   Bilirubin Total 0.5 0.0 - 1.2 mg/dL   Alkaline Phosphatase 68 39 - 117 IU/L   AST 15 0 - 40 IU/L   ALT 13 0 - 32 IU/L  TSH  Result Value Ref Range   TSH 5.090 (H) 0.450 - 4.500 uIU/mL      Assessment & Plan:   1. Acquired hypothyroidism - CMP14+EGFR - TSH  2. Essential hypertension  3. Suppurative hidradenitis   Continue all other maintenance medications as listed above.  Follow up plan: Recheck 6 months  Educational handout given for [place] PA-C [place] [address]  [place], [place] [address] [phone]   02/16/2018, 8:10 AM

## 2018-02-16 NOTE — Patient Instructions (Signed)
In a few days you may receive a survey in the mail or online from [employer] regarding your visit with us today. Please take a moment to fill this out. Your feedback is very important to our whole office. It can help us better understand your needs as well as improve your experience and satisfaction. Thank you for taking your time to complete it. We care about you.  Matisyn Cabeza, PA-C  

## 2018-03-07 MED FILL — SETLAKIN 0.15 MG-0.03 MG TA: 0.15-0.03 | 91 days supply | Qty: 91 | Fill #1

## 2018-03-29 ENCOUNTER — Ambulatory Visit (INDEPENDENT_AMBULATORY_CARE_PROVIDER_SITE_OTHER): Payer: 59 | Admitting: Physician Assistant

## 2018-03-29 ENCOUNTER — Encounter: Payer: Self-pay | Admitting: Physician Assistant

## 2018-03-29 DIAGNOSIS — E7841 Elevated Lipoprotein(a): Secondary | ICD-10-CM | POA: Diagnosis not present

## 2018-03-29 DIAGNOSIS — I10 Essential (primary) hypertension: Secondary | ICD-10-CM

## 2018-03-29 DIAGNOSIS — Z6841 Body Mass Index (BMI) 40.0 and over, adult: Secondary | ICD-10-CM | POA: Diagnosis not present

## 2018-03-29 MED ORDER — NALTREXONE-BUPROPION HCL ER 8-90 MG PO TB12: 2.0000 | ORAL_TABLET | Freq: Two times a day (BID) | ORAL | 3 refills | Status: DC

## 2018-03-29 NOTE — Patient Instructions (Signed)
In a few days you may receive a survey in the mail or online from [employer] regarding your visit with us today. Please take a moment to fill this out. Your feedback is very important to our whole office. It can help us better understand your needs as well as improve your experience and satisfaction. Thank you for taking your time to complete it. We care about you.  Raymont Andreoni, PA-C  

## 2018-03-30 DIAGNOSIS — Z6841 Body Mass Index (BMI) 40.0 and over, adult: Secondary | ICD-10-CM | POA: Insufficient documentation

## 2018-03-30 DIAGNOSIS — E7841 Elevated Lipoprotein(a): Secondary | ICD-10-CM | POA: Insufficient documentation

## 2018-03-30 NOTE — Progress Notes (Signed)
BP 128/85   Pulse 86   Temp 98.2 F (36.8 C) (Oral)   Ht _0  (1.575 m)   Wt 272 lb (123.4 kg)   BMI 49.75 kg/m    Subjective:    Patient ID: Jacqueline Peterson, female    DOB: August 16, 1986, 32 y.o.   MRN: [id number]  HPI: Jacqueline Peterson is a 32 y.o. female presenting on 03/29/2018 for No chief complaint on file.  Patient comes in for recheck on her medical conditions, positive for hypertension, hyperlipidemia,.  She is not been very good at losing weight on her own diet efforts.  In the past she has tried [place] and had side effects.  In addition she tried Topamax and had confusion from it.  She has tried phentermine in the past but is trying not to do that long-term.  She definitely meets criteria for obesity treatment.   Past Medical History:  Diagnosis Date  . History of chicken pox   . HSV infection   . Hypothyroidism    Relevant past medical, surgical, family and social history reviewed and updated as indicated. Interim medical history since our last visit reviewed. Allergies and medications reviewed and updated. DATA REVIEWED: CHART IN EPIC  Family History reviewed for pertinent findings.  Review of Systems  Allergies as of 03/29/2018      Reactions   Saxenda [liraglutide -weight Management]    nausea   Topamax [topiramate] Other (See Comments)   Confusion       Medication List        Accurate as of 03/29/18 11:59 PM. Always use your most recent med list.          ALPRAZolam 0.25 MG tablet Commonly known as:  XANAX Take 1 tablet (0.25 mg total) by mouth at bedtime as needed for anxiety or sleep.   aspirin EC 81 MG tablet Take 81 mg by mouth daily.   atorvastatin 10 MG tablet Commonly known as:  LIPITOR Take 10 mg by mouth daily.   clindamycin 300 MG capsule Commonly known as:  CLEOCIN Take 1 capsule (300 mg total) by mouth daily.   CoQ10 200 MG Caps Take 200 mg by mouth daily.   FIBER-CAPS PO Take 1 capsule by mouth 2 (two) times  daily.   Fish Oil 1200 MG Caps Take 1,200 mg by mouth 3 (three) times daily.   levonorgestrel-ethinyl estradiol 0.15-0.03 MG tablet Commonly known as:  SEASONALE,INTROVALE,JOLESSA Take 1 tablet by mouth daily.   levothyroxine 112 MCG tablet Commonly known as:  SYNTHROID, LEVOTHROID TAKE 1 TABLET (75 MCG TOTAL) BY MOUTH DAILY.   lisinopril 10 MG tablet Commonly known as:  PRINIVIL,ZESTRIL Take 1 tablet (10 mg total) by mouth daily.   Melatonin 3 MG Tabs Take by mouth.   multivitamin tablet Take 1 tablet by mouth daily.   Naltrexone-buPROPion HCl ER 8-90 MG Tb12 Take 2 tablets by mouth 2 (two) times daily.   pantoprazole 40 MG tablet Commonly known as:  PROTONIX Take 1 tablet (40 mg total) by mouth daily.   Probiotic Caps Take 1 capsule by mouth daily.   valACYclovir 1000 MG tablet Commonly known as:  VALTREX Take 1 tablet (1,000 mg total) by mouth daily.   ZYRTEC PO Take 1 tablet by mouth daily.          Objective:    BP 128/85   Pulse 86   Temp 98.2 F (36.8 C) (Oral)   Ht _1  (1.575 m)   Wt  272 lb (123.4 kg)   BMI 49.75 kg/m   Allergies  Allergen Reactions  . Saxenda [Liraglutide -Weight Management]     nausea  . Topamax [Topiramate] Other (See Comments)    Confusion      Wt Readings from Last 3 Encounters:  03/29/18 272 lb (123.4 kg)  02/15/18 271 lb 12.8 oz (123.3 kg)  10/19/17 266 lb (120.7 kg)    Physical Exam  Results for orders placed or performed in visit on 02/15/18  CMP14+EGFR  Result Value Ref Range   Glucose 71 65 - 99 mg/dL   BUN 8 6 - 20 mg/dL   Creatinine, Ser 0.97 0.57 - 1.00 mg/dL   GFR calc non Af Amer 77 >59 mL/min/1.73   GFR calc Af Amer 89 >59 mL/min/1.73   BUN/Creatinine Ratio 8 (L) 9 - 23   Sodium 139 134 - 144 mmol/L   Potassium 4.7 3.5 - 5.2 mmol/L   Chloride 102 96 - 106 mmol/L   CO2 21 20 - 29 mmol/L   Calcium 9.7 8.7 - 10.2 mg/dL   Total Protein 7.1 6.0 - 8.5 g/dL   Albumin 4.2 3.5 - 5.5 g/dL    Globulin, Total 2.9 1.5 - 4.5 g/dL   Albumin/Globulin Ratio 1.4 1.2 - 2.2   Bilirubin Total 0.5 0.0 - 1.2 mg/dL   Alkaline Phosphatase 68 39 - 117 IU/L   AST 15 0 - 40 IU/L   ALT 13 0 - 32 IU/L  TSH  Result Value Ref Range   TSH 5.090 (H) 0.450 - 4.500 uIU/mL      Assessment & Plan:   1. Morbid obesity (HCC) - Naltrexone-buPROPion HCl ER 8-90 MG TB12; Take 2 tablets by mouth 2 (two) times daily.  Dispense: 120 tablet; Refill: 3  2. Essential hypertension Continue lisinopril 10 mg 1 daily  3. Elevated lipoprotein(a) - Omega-3 Fatty Acids (FISH OIL) 1200 MG CAPS; Take 1,200 mg by mouth 3 (three) times daily. - atorvastatin (LIPITOR) 10 MG tablet; Take 10 mg by mouth daily. - Coenzyme Q10 (COQ10) 200 MG CAPS; Take 200 mg by mouth daily.  4. BMI 45.0-49.9, adult (HCC) Contrave   Continue all other maintenance medications as listed above.  Follow up plan: Return in about 2 months (around 05/29/2018) for recheck.  Educational handout given for [place] PA-C [place] [address]  [place], [place] [address] [phone]   03/30/2018, 1:34 PM

## 2018-04-15 MED FILL — CONTRAVE ER 8-90 MG TABLET: 8-90 | 30 days supply | Qty: 120 | Fill #0

## 2018-04-18 MED FILL — ATORVASTATIN 10 MG TABLET: 10 | 90 days supply | Qty: 90 | Fill #2

## 2018-04-18 MED FILL — CLINDAMYCIN HCL 300 MG CAP: 300 | 90 days supply | Qty: 90 | Fill #2

## 2018-05-09 ENCOUNTER — Other Ambulatory Visit: Payer: Self-pay | Admitting: Physician Assistant

## 2018-05-09 DIAGNOSIS — I10 Essential (primary) hypertension: Secondary | ICD-10-CM

## 2018-05-09 DIAGNOSIS — N390 Urinary tract infection, site not specified: Secondary | ICD-10-CM | POA: Diagnosis not present

## 2018-05-09 MED FILL — PANTOPRAZOLE SOD DR 40 MG T: 40 | 90 days supply | Qty: 90 | Fill #1

## 2018-05-09 MED FILL — LISINOPRIL 10 MG TABLET: 10 | 90 days supply | Qty: 90 | Fill #0

## 2018-05-18 DIAGNOSIS — M79671 Pain in right foot: Secondary | ICD-10-CM | POA: Diagnosis not present

## 2018-05-24 ENCOUNTER — Ambulatory Visit: Payer: 59 | Admitting: Audiology

## 2018-07-28 ENCOUNTER — Encounter: Payer: Self-pay | Admitting: Physician Assistant

## 2018-07-29 ENCOUNTER — Telehealth: Payer: Self-pay | Admitting: Family Medicine

## 2018-07-29 ENCOUNTER — Other Ambulatory Visit: Payer: Self-pay | Admitting: Family Medicine

## 2018-07-29 MED ORDER — LABETALOL HCL 100 MG PO TABS: 100.0000 mg | ORAL_TABLET | Freq: Two times a day (BID) | ORAL | 1 refills | Status: DC

## 2018-07-29 MED FILL — LABETALOL HCL 100 MG TABS: 100 | 30 days supply | Qty: 60 | Fill #0

## 2018-07-29 NOTE — Progress Notes (Signed)
Patient with recent positive home pregnancy test.  I recommended that she discontinue lisinopril, atorvastatin and Wellbutrin/naltrexone combination.  I replaced her lisinopril with labetalol 100 mg p.o. twice daily.  She is to follow-up with her PCP for blood pressure recheck.  May need to increase this dose.  She will also likely need her Synthroid dose increased given recent pregnancy.  Medications Discontinued During This Encounter  Medication Reason  . lisinopril (PRINIVIL,ZESTRIL) 10 MG tablet   . levonorgestrel-ethinyl estradiol (SEASONALE,INTROVALE,JOLESSA) 0.15-0.03 MG tablet   . pantoprazole (PROTONIX) 40 MG tablet   . Naltrexone-buPROPion HCl ER 8-90 MG TB12   . atorvastatin (LIPITOR) 10 MG tablet    Meds ordered this encounter  Medications  . labetalol (NORMODYNE) 100 MG tablet    Sig: Take 1 tablet (100 mg total) by mouth 2 (two) times daily.    Dispense:  60 tablet    Refill:  1   Ashly M. Lajuana Ripple, [facility]

## 2018-07-29 NOTE — Telephone Encounter (Signed)
Attempted to call patient to make sure that she got my my chart message with instructions to discontinue lisinopril, Lipitor and weight loss medication.  I have replaced her lisinopril with labetalol for blood pressure control.  She may use Tums for acid reflux.   I would like her to actually go ahead and increase her dose of Synthroid.  It is recommended to increase the total weekly Synthroid dose by 30%.  She can take a double dose 2 days each week.  The rest of the days take normal 112 mcg daily.  I would like her to follow-up with her PCP within the next couple weeks to have TSH drawn.  She should have TSH drawn every 4 weeks and her Synthroid dose will need to be titrated pending her TSH values.  Please attempt to contact the patient again to inform her of these recommendations.  I will also cc her PCP  Ashly M. Lajuana Ripple, [facility]

## 2018-08-04 DIAGNOSIS — Z3201 Encounter for pregnancy test, result positive: Secondary | ICD-10-CM | POA: Diagnosis not present

## 2018-08-10 MED FILL — LEVOTHYROXINE 112 MCG TAB: 112 | 90 days supply | Qty: 90 | Fill #1

## 2018-08-11 MED FILL — PANTOPRAZOLE SOD DR 40 MG T: 40 | 90 days supply | Qty: 90 | Fill #2

## 2018-08-17 DIAGNOSIS — Z3201 Encounter for pregnancy test, result positive: Secondary | ICD-10-CM | POA: Diagnosis not present

## 2018-08-25 DIAGNOSIS — Z3A08 8 weeks gestation of pregnancy: Secondary | ICD-10-CM | POA: Diagnosis not present

## 2018-08-25 DIAGNOSIS — O10919 Unspecified pre-existing hypertension complicating pregnancy, unspecified trimester: Secondary | ICD-10-CM | POA: Diagnosis not present

## 2018-08-25 DIAGNOSIS — Z3481 Encounter for supervision of other normal pregnancy, first trimester: Secondary | ICD-10-CM | POA: Diagnosis not present

## 2018-08-25 DIAGNOSIS — O99281 Endocrine, nutritional and metabolic diseases complicating pregnancy, first trimester: Secondary | ICD-10-CM | POA: Diagnosis not present

## 2018-08-25 DIAGNOSIS — Z3689 Encounter for other specified antenatal screening: Secondary | ICD-10-CM | POA: Diagnosis not present

## 2018-08-25 LAB — OB RESULTS CONSOLE HEPATITIS B SURFACE ANTIGEN: Hepatitis B Surface Ag: NEGATIVE

## 2018-08-25 LAB — OB RESULTS CONSOLE HIV ANTIBODY (ROUTINE TESTING): HIV: NONREACTIVE

## 2018-08-25 LAB — OB RESULTS CONSOLE ANTIBODY SCREEN: Antibody Screen: NEGATIVE

## 2018-08-25 LAB — OB RESULTS CONSOLE RUBELLA ANTIBODY, IGM: Rubella: IMMUNE

## 2018-08-25 LAB — OB RESULTS CONSOLE RPR: RPR: NONREACTIVE

## 2018-08-25 LAB — OB RESULTS CONSOLE ABO/RH: RH Type: POSITIVE

## 2018-08-31 ENCOUNTER — Ambulatory Visit (INDEPENDENT_AMBULATORY_CARE_PROVIDER_SITE_OTHER): Payer: 59 | Admitting: Physician Assistant

## 2018-08-31 ENCOUNTER — Encounter: Payer: Self-pay | Admitting: Physician Assistant

## 2018-08-31 VITALS — BP 114/67 | HR 60 | Temp 97.9°F | Ht 62.0 in | Wt 279.6 lb

## 2018-08-31 DIAGNOSIS — I10 Essential (primary) hypertension: Secondary | ICD-10-CM | POA: Diagnosis not present

## 2018-08-31 DIAGNOSIS — E039 Hypothyroidism, unspecified: Secondary | ICD-10-CM | POA: Diagnosis not present

## 2018-08-31 DIAGNOSIS — O99810 Abnormal glucose complicating pregnancy: Secondary | ICD-10-CM | POA: Diagnosis not present

## 2018-08-31 DIAGNOSIS — O219 Vomiting of pregnancy, unspecified: Secondary | ICD-10-CM

## 2018-08-31 MED ORDER — DOXYLAMINE-PYRIDOXINE 10-10 MG PO TBEC: 20.0000 mg | DELAYED_RELEASE_TABLET | Freq: Every day | ORAL | 0 refills | Status: DC

## 2018-08-31 MED ORDER — FREESTYLE LIBRE READER DEVI: 1.0000 [IU] | 5 refills | Status: DC | PRN

## 2018-08-31 MED ORDER — FREESTYLE LIBRE 14 DAY SENSOR MISC: 2.0000 [IU] | Freq: Four times a day (QID) | 11 refills | Status: DC

## 2018-08-31 MED ORDER — LABETALOL HCL 100 MG PO TABS: 100.0000 mg | ORAL_TABLET | Freq: Two times a day (BID) | ORAL | 3 refills | Status: DC

## 2018-08-31 MED FILL — LABETALOL HCL 100 MG TABS: 100 | 90 days supply | Qty: 180 | Fill #0

## 2018-08-31 NOTE — Telephone Encounter (Signed)
LMTCB

## 2018-09-01 ENCOUNTER — Telehealth: Payer: Self-pay

## 2018-09-01 MED FILL — valACYclovir HCL 1 GM TABS: 1 | 90 days supply | Qty: 90 | Fill #1

## 2018-09-01 NOTE — Telephone Encounter (Signed)
Dont see a DX for this sensor???

## 2018-09-01 NOTE — Telephone Encounter (Signed)
Hyperglycemia in pregnancy

## 2018-09-01 NOTE — Telephone Encounter (Signed)
Has an appt with Provider today 09/01/18.  Multiple attempts made to contact the patient.  This encounter will now be closed

## 2018-09-02 DIAGNOSIS — O99810 Abnormal glucose complicating pregnancy: Secondary | ICD-10-CM | POA: Insufficient documentation

## 2018-09-02 NOTE — Progress Notes (Signed)
BP 114/67   Pulse 60   Temp 97.9 F (36.6 C) (Oral)   Ht _0  (1.575 m)   Wt 279 lb 9.6 oz (126.8 kg)   BMI 51.14 kg/m    Subjective:    Patient ID: Jacqueline Peterson, female    DOB: 09-23-1986, 32 y.o.   MRN: [id number]  HPI: Jacqueline Peterson is a 32 y.o. female presenting on 08/31/2018 for Hypertension  This patient comes in for recheck on her hypertension.  She was found to be pregnant and needed her medication change.  This is her first check since being moved to the labetalol.  She states that she is feeling very good on it.  She actually is feeling better than she did on the previous medication.  She is having some nausea and is taking diclegis.  She is also going to be followed by her OB/GYN for her hypothyroidism during her pregnancy.  Past Medical History:  Diagnosis Date  . History of chicken pox   . HSV infection   . Hypothyroidism    Relevant past medical, surgical, family and social history reviewed and updated as indicated. Interim medical history since our last visit reviewed. Allergies and medications reviewed and updated. DATA REVIEWED: CHART IN EPIC  Family History reviewed for pertinent findings.  Review of Systems  Constitutional: Negative.   HENT: Negative.   Eyes: Negative.   Respiratory: Negative.   Gastrointestinal: Positive for nausea and vomiting.  Genitourinary: Negative.     Allergies as of 08/31/2018      Reactions   Amlodipine Other (See Comments)   Saxenda [liraglutide -weight Management]    nausea   Topamax [topiramate] Other (See Comments)   Confusion       Medication List        Accurate as of 08/31/18 11:59 PM. Always use your most recent med list.          Doxylamine-Pyridoxine 10-10 MG Tbec Take 20 mg by mouth at bedtime.   FREESTYLE LIBRE 14 DAY SENSOR Misc 2 Units by Does not apply route 4 (four) times daily.   FREESTYLE LIBRE READER Devi 1 Units by Does not apply route continuous as needed.   labetalol  100 MG tablet Commonly known as:  NORMODYNE Take 1 tablet (100 mg total) by mouth 2 (two) times daily.   levothyroxine 112 MCG tablet Commonly known as:  SYNTHROID, LEVOTHROID TAKE 1 TABLET (75 MCG TOTAL) BY MOUTH DAILY.   Melatonin 3 MG Tabs Take by mouth.   multivitamin tablet Take 1 tablet by mouth daily.   valACYclovir 1000 MG tablet Commonly known as:  VALTREX Take 1 tablet (1,000 mg total) by mouth daily.   ZYRTEC PO Take 1 tablet by mouth daily.          Objective:    BP 114/67   Pulse 60   Temp 97.9 F (36.6 C) (Oral)   Ht _1  (1.575 m)   Wt 279 lb 9.6 oz (126.8 kg)   BMI 51.14 kg/m   Allergies  Allergen Reactions  . Amlodipine Other (See Comments)  . Saxenda [Liraglutide -Weight Management]     nausea  . Topamax [Topiramate] Other (See Comments)    Confusion      Wt Readings from Last 3 Encounters:  08/31/18 279 lb 9.6 oz (126.8 kg)  03/29/18 272 lb (123.4 kg)  02/15/18 271 lb 12.8 oz (123.3 kg)    Physical Exam  Constitutional: She is oriented to person, place, and  time. She appears well-developed and well-nourished.  HENT:  Head: Normocephalic and atraumatic.  Eyes: Pupils are equal, round, and reactive to light. Conjunctivae and EOM are normal.  Cardiovascular: Normal rate, regular rhythm, normal heart sounds and intact distal pulses.  Pulmonary/Chest: Effort normal and breath sounds normal.  Abdominal: Soft. Bowel sounds are normal.  Neurological: She is alert and oriented to person, place, and time. She has normal reflexes.  Skin: Skin is warm and dry. No rash noted.  Psychiatric: She has a normal mood and affect. Her behavior is normal. Judgment and thought content normal.    Results for orders placed or performed in visit on 02/15/18  CMP14+EGFR  Result Value Ref Range   Glucose 71 65 - 99 mg/dL   BUN 8 6 - 20 mg/dL   Creatinine, Ser 0.97 0.57 - 1.00 mg/dL   GFR calc non Af Amer 77 >59 mL/min/1.73   GFR calc Af Amer 89 >59  mL/min/1.73   BUN/Creatinine Ratio 8 (L) 9 - 23   Sodium 139 134 - 144 mmol/L   Potassium 4.7 3.5 - 5.2 mmol/L   Chloride 102 96 - 106 mmol/L   CO2 21 20 - 29 mmol/L   Calcium 9.7 8.7 - 10.2 mg/dL   Total Protein 7.1 6.0 - 8.5 g/dL   Albumin 4.2 3.5 - 5.5 g/dL   Globulin, Total 2.9 1.5 - 4.5 g/dL   Albumin/Globulin Ratio 1.4 1.2 - 2.2   Bilirubin Total 0.5 0.0 - 1.2 mg/dL   Alkaline Phosphatase 68 39 - 117 IU/L   AST 15 0 - 40 IU/L   ALT 13 0 - 32 IU/L  TSH  Result Value Ref Range   TSH 5.090 (H) 0.450 - 4.500 uIU/mL      Assessment & Plan:   1. Essential hypertension - labetalol (NORMODYNE) 100 MG tablet; Take 1 tablet (100 mg total) by mouth 2 (two) times daily.  Dispense: 180 tablet; Refill: 3  2. Hypothyroidism, unspecified type Follow with OB/GYN during pregnancy  3. Hyperglycemia in pregnancy - Continuous Blood Gluc Sensor (FREESTYLE LIBRE 14 DAY SENSOR) MISC; 2 Units by Does not apply route 4 (four) times daily.  Dispense: 2 each; Refill: 11 - Continuous Blood Gluc Receiver (FREESTYLE LIBRE READER) DEVI; 1 Units by Does not apply route continuous as needed.  Dispense: 1 Device; Refill: 5  4. Nausea and vomiting during pregnancy - Doxylamine-Pyridoxine (DICLEGIS) 10-10 MG TBEC; Take 20 mg by mouth at bedtime.  Dispense: 60 tablet; Refill: 0   Continue all other maintenance medications as listed above.  Follow up plan: No follow-ups on file.  Educational handout given for [place] PA-C [place] [address]  [place], [place] [address] [phone]   09/02/2018, 12:45 PM

## 2018-09-06 ENCOUNTER — Telehealth: Payer: Self-pay

## 2018-09-06 NOTE — Telephone Encounter (Signed)
Insurance denied [employer] Did not meet guidelines

## 2018-09-08 DIAGNOSIS — Z3481 Encounter for supervision of other normal pregnancy, first trimester: Secondary | ICD-10-CM | POA: Diagnosis not present

## 2018-09-08 DIAGNOSIS — O10919 Unspecified pre-existing hypertension complicating pregnancy, unspecified trimester: Secondary | ICD-10-CM | POA: Diagnosis not present

## 2018-09-08 DIAGNOSIS — Z3A10 10 weeks gestation of pregnancy: Secondary | ICD-10-CM | POA: Diagnosis not present

## 2018-09-08 DIAGNOSIS — Z1151 Encounter for screening for human papillomavirus (HPV): Secondary | ICD-10-CM | POA: Diagnosis not present

## 2018-09-08 DIAGNOSIS — O99281 Endocrine, nutritional and metabolic diseases complicating pregnancy, first trimester: Secondary | ICD-10-CM | POA: Diagnosis not present

## 2018-09-08 DIAGNOSIS — Z118 Encounter for screening for other infectious and parasitic diseases: Secondary | ICD-10-CM | POA: Diagnosis not present

## 2018-09-27 ENCOUNTER — Encounter: Payer: 59 | Admitting: Physician Assistant

## 2018-10-18 DIAGNOSIS — Z361 Encounter for antenatal screening for raised alphafetoprotein level: Secondary | ICD-10-CM | POA: Diagnosis not present

## 2018-10-18 DIAGNOSIS — Z3A16 16 weeks gestation of pregnancy: Secondary | ICD-10-CM | POA: Diagnosis not present

## 2018-10-18 DIAGNOSIS — O99282 Endocrine, nutritional and metabolic diseases complicating pregnancy, second trimester: Secondary | ICD-10-CM | POA: Diagnosis not present

## 2018-10-19 MED FILL — LEVOTHYROXINE 125 MCG TAB: 125 | 30 days supply | Qty: 30 | Fill #0

## 2018-11-01 DIAGNOSIS — Z363 Encounter for antenatal screening for malformations: Secondary | ICD-10-CM | POA: Diagnosis not present

## 2018-11-01 DIAGNOSIS — Z3A18 18 weeks gestation of pregnancy: Secondary | ICD-10-CM | POA: Diagnosis not present

## 2018-11-01 DIAGNOSIS — O99282 Endocrine, nutritional and metabolic diseases complicating pregnancy, second trimester: Secondary | ICD-10-CM | POA: Diagnosis not present

## 2018-11-01 DIAGNOSIS — O358XX0 Maternal care for other (suspected) fetal abnormality and damage, not applicable or unspecified: Secondary | ICD-10-CM | POA: Diagnosis not present

## 2018-11-24 DIAGNOSIS — Z3A21 21 weeks gestation of pregnancy: Secondary | ICD-10-CM | POA: Diagnosis not present

## 2018-11-24 DIAGNOSIS — O99282 Endocrine, nutritional and metabolic diseases complicating pregnancy, second trimester: Secondary | ICD-10-CM | POA: Diagnosis not present

## 2018-11-24 DIAGNOSIS — Z362 Encounter for other antenatal screening follow-up: Secondary | ICD-10-CM | POA: Diagnosis not present

## 2018-11-25 MED FILL — LEVOTHYROXINE 125 MCG TAB: 125 | 30 days supply | Qty: 30 | Fill #0

## 2018-11-30 MED FILL — PANTOPRAZOLE SOD DR 40 MG T: 40 | 90 days supply | Qty: 90 | Fill #3

## 2018-12-27 DIAGNOSIS — J019 Acute sinusitis, unspecified: Secondary | ICD-10-CM | POA: Diagnosis not present

## 2019-01-04 MED FILL — LEVOTHYROXINE 125 MCG TAB: 125 | 30 days supply | Qty: 30 | Fill #1

## 2019-01-11 DIAGNOSIS — O99283 Endocrine, nutritional and metabolic diseases complicating pregnancy, third trimester: Secondary | ICD-10-CM | POA: Diagnosis not present

## 2019-01-11 DIAGNOSIS — O10013 Pre-existing essential hypertension complicating pregnancy, third trimester: Secondary | ICD-10-CM | POA: Diagnosis not present

## 2019-01-11 DIAGNOSIS — O10012 Pre-existing essential hypertension complicating pregnancy, second trimester: Secondary | ICD-10-CM | POA: Diagnosis not present

## 2019-01-11 DIAGNOSIS — Z3689 Encounter for other specified antenatal screening: Secondary | ICD-10-CM | POA: Diagnosis not present

## 2019-01-11 DIAGNOSIS — Z3A28 28 weeks gestation of pregnancy: Secondary | ICD-10-CM | POA: Diagnosis not present

## 2019-01-17 DIAGNOSIS — O99810 Abnormal glucose complicating pregnancy: Secondary | ICD-10-CM | POA: Diagnosis not present

## 2019-01-17 DIAGNOSIS — Z3A29 29 weeks gestation of pregnancy: Secondary | ICD-10-CM | POA: Diagnosis not present

## 2019-01-24 ENCOUNTER — Encounter: Payer: Self-pay | Admitting: Physician Assistant

## 2019-01-24 DIAGNOSIS — Z23 Encounter for immunization: Secondary | ICD-10-CM | POA: Diagnosis not present

## 2019-02-07 ENCOUNTER — Other Ambulatory Visit: Payer: Self-pay | Admitting: Obstetrics and Gynecology

## 2019-02-08 DIAGNOSIS — O133 Gestational [pregnancy-induced] hypertension without significant proteinuria, third trimester: Secondary | ICD-10-CM | POA: Diagnosis not present

## 2019-02-08 DIAGNOSIS — Z3A32 32 weeks gestation of pregnancy: Secondary | ICD-10-CM | POA: Diagnosis not present

## 2019-02-08 DIAGNOSIS — O99283 Endocrine, nutritional and metabolic diseases complicating pregnancy, third trimester: Secondary | ICD-10-CM | POA: Diagnosis not present

## 2019-02-08 MED FILL — LABETALOL HCL 100 MG TABS: 100 | 90 days supply | Qty: 180 | Fill #1

## 2019-02-08 MED FILL — LEVOTHYROXINE 125 MCG TAB: 125 | 30 days supply | Qty: 30 | Fill #0

## 2019-02-15 DIAGNOSIS — O99283 Endocrine, nutritional and metabolic diseases complicating pregnancy, third trimester: Secondary | ICD-10-CM | POA: Diagnosis not present

## 2019-02-15 DIAGNOSIS — Z3A33 33 weeks gestation of pregnancy: Secondary | ICD-10-CM | POA: Diagnosis not present

## 2019-02-15 DIAGNOSIS — O133 Gestational [pregnancy-induced] hypertension without significant proteinuria, third trimester: Secondary | ICD-10-CM | POA: Diagnosis not present

## 2019-02-23 DIAGNOSIS — Z3A34 34 weeks gestation of pregnancy: Secondary | ICD-10-CM | POA: Diagnosis not present

## 2019-02-23 DIAGNOSIS — O99283 Endocrine, nutritional and metabolic diseases complicating pregnancy, third trimester: Secondary | ICD-10-CM | POA: Diagnosis not present

## 2019-02-23 DIAGNOSIS — O133 Gestational [pregnancy-induced] hypertension without significant proteinuria, third trimester: Secondary | ICD-10-CM | POA: Diagnosis not present

## 2019-02-24 MED FILL — valACYclovir HCL 1 GM TABS: 1 | 30 days supply | Qty: 30 | Fill #0

## 2019-02-24 MED FILL — PANTOPRAZOLE SOD DR 40 MG T: 40 | 30 days supply | Qty: 30 | Fill #0

## 2019-02-28 DIAGNOSIS — Z3A35 35 weeks gestation of pregnancy: Secondary | ICD-10-CM | POA: Diagnosis not present

## 2019-02-28 DIAGNOSIS — Z3480 Encounter for supervision of other normal pregnancy, unspecified trimester: Secondary | ICD-10-CM | POA: Diagnosis not present

## 2019-02-28 DIAGNOSIS — O10919 Unspecified pre-existing hypertension complicating pregnancy, unspecified trimester: Secondary | ICD-10-CM | POA: Diagnosis not present

## 2019-02-28 DIAGNOSIS — O133 Gestational [pregnancy-induced] hypertension without significant proteinuria, third trimester: Secondary | ICD-10-CM | POA: Diagnosis not present

## 2019-02-28 DIAGNOSIS — O99283 Endocrine, nutritional and metabolic diseases complicating pregnancy, third trimester: Secondary | ICD-10-CM | POA: Diagnosis not present

## 2019-03-07 DIAGNOSIS — Z3482 Encounter for supervision of other normal pregnancy, second trimester: Secondary | ICD-10-CM | POA: Diagnosis not present

## 2019-03-07 DIAGNOSIS — Z3483 Encounter for supervision of other normal pregnancy, third trimester: Secondary | ICD-10-CM | POA: Diagnosis not present

## 2019-03-14 MED FILL — LEVOTHYROXINE 125 MCG TAB: 125 | 30 days supply | Qty: 30 | Fill #1

## 2019-03-15 ENCOUNTER — Encounter (HOSPITAL_COMMUNITY): Payer: Self-pay | Admitting: *Deleted

## 2019-03-15 ENCOUNTER — Telehealth (HOSPITAL_COMMUNITY): Payer: Self-pay | Admitting: *Deleted

## 2019-03-15 NOTE — Telephone Encounter (Signed)
Preadmission screen  

## 2019-03-16 ENCOUNTER — Encounter (HOSPITAL_COMMUNITY): Payer: Self-pay

## 2019-03-16 NOTE — Patient Instructions (Signed)
JAMEKIA GANNETT  03/16/2019   Your procedure is scheduled on:  03/28/2019  Arrive at 54 at [facility] on [address] at [facility]  and [employer]. You are invited to use the FREE valet parking or use the Visitor's parking deck.  Pick up the phone at the desk and dial [phone].  Call this number if you have problems the morning of surgery: [phone]  Remember:   Do not eat food:(After Midnight) Desps de medianoche.  Do not drink clear liquids: (After Midnight) Desps de medianoche.  Take these medicines the morning of surgery with A SIP OF WATER:  Take your levothyroxine as prescribed   Do not wear jewelry, make-up or nail polish.  Do not wear lotions, powders, or perfumes. Do not wear deodorant.  Do not shave 48 hours prior to surgery.  Do not bring valuables to the hospital.  [facility] is not   responsible for any belongings or valuables brought to the hospital.  Contacts, dentures or bridgework may not be worn into surgery.  Leave suitcase in the car. After surgery it may be brought to your room.  For patients admitted to the hospital, checkout time is 11:00 AM the day of              discharge.      Please read over the following fact sheets that you were given:     Preparing for Surgery

## 2019-03-21 MED FILL — PANTOPRAZOLE SOD DR 40 MG T: 40 | 30 days supply | Qty: 30 | Fill #1

## 2019-03-23 ENCOUNTER — Other Ambulatory Visit: Payer: Self-pay | Admitting: Obstetrics and Gynecology

## 2019-03-24 ENCOUNTER — Other Ambulatory Visit (HOSPITAL_COMMUNITY)
Admission: RE | Admit: 2019-03-24 | Discharge: 2019-03-24 | Disposition: A | Discharge status: Home / Self Care | Payer: 59 | Source: Ambulatory Visit | Attending: Obstetrics and Gynecology | Admitting: Obstetrics and Gynecology

## 2019-03-24 ENCOUNTER — Other Ambulatory Visit: Payer: Self-pay

## 2019-03-24 DIAGNOSIS — Z1159 Encounter for screening for other viral diseases: Secondary | ICD-10-CM | POA: Insufficient documentation

## 2019-03-24 NOTE — MAU Note (Signed)
Asymptomatic, swab collected without problem.

## 2019-03-25 LAB — NOVEL CORONAVIRUS, NAA (HOSP ORDER, SEND-OUT TO REF LAB; TAT 18-24 HRS): SARS-CoV-2, NAA: NOT DETECTED

## 2019-03-27 ENCOUNTER — Encounter (HOSPITAL_COMMUNITY)
Admission: RE | Admit: 2019-03-27 | Discharge: 2019-03-27 | Disposition: A | Discharge status: Home / Self Care | Payer: 59 | Source: Ambulatory Visit | Attending: Obstetrics and Gynecology | Admitting: Obstetrics and Gynecology

## 2019-03-27 HISTORY — DX: Essential (primary) hypertension: I10

## 2019-03-27 HISTORY — DX: Other complications of anesthesia, initial encounter: T88.59XA

## 2019-03-27 HISTORY — DX: Gastro-esophageal reflux disease without esophagitis: K21.9

## 2019-03-27 HISTORY — DX: Adverse effect of unspecified anesthetic, initial encounter: T41.45XA

## 2019-03-28 ENCOUNTER — Inpatient Hospital Stay (HOSPITAL_COMMUNITY): Payer: 59 | Admitting: Certified Registered"

## 2019-03-28 ENCOUNTER — Encounter (HOSPITAL_COMMUNITY)
Admission: RE | Disposition: A | Discharge status: Home / Self Care | Payer: Self-pay | Source: Home / Self Care | Attending: Obstetrics and Gynecology

## 2019-03-28 ENCOUNTER — Encounter (HOSPITAL_COMMUNITY): Payer: Self-pay | Admitting: *Deleted

## 2019-03-28 ENCOUNTER — Inpatient Hospital Stay (HOSPITAL_COMMUNITY)
Admission: RE | Admit: 2019-03-28 | Discharge: 2019-03-30 | DRG: 787 | Disposition: A | Discharge status: Home / Self Care | Payer: 59 | Attending: Obstetrics and Gynecology | Admitting: Obstetrics and Gynecology

## 2019-03-28 ENCOUNTER — Other Ambulatory Visit: Payer: Self-pay

## 2019-03-28 DIAGNOSIS — Z87891 Personal history of nicotine dependence: Secondary | ICD-10-CM | POA: Diagnosis not present

## 2019-03-28 DIAGNOSIS — D62 Acute posthemorrhagic anemia: Secondary | ICD-10-CM | POA: Diagnosis not present

## 2019-03-28 DIAGNOSIS — O99284 Endocrine, nutritional and metabolic diseases complicating childbirth: Secondary | ICD-10-CM | POA: Diagnosis present

## 2019-03-28 DIAGNOSIS — O99214 Obesity complicating childbirth: Secondary | ICD-10-CM | POA: Diagnosis present

## 2019-03-28 DIAGNOSIS — K219 Gastro-esophageal reflux disease without esophagitis: Secondary | ICD-10-CM | POA: Diagnosis present

## 2019-03-28 DIAGNOSIS — O99344 Other mental disorders complicating childbirth: Secondary | ICD-10-CM | POA: Diagnosis present

## 2019-03-28 DIAGNOSIS — O34211 Maternal care for low transverse scar from previous cesarean delivery: Principal | ICD-10-CM | POA: Diagnosis present

## 2019-03-28 DIAGNOSIS — E039 Hypothyroidism, unspecified: Secondary | ICD-10-CM | POA: Diagnosis present

## 2019-03-28 DIAGNOSIS — O9962 Diseases of the digestive system complicating childbirth: Secondary | ICD-10-CM | POA: Diagnosis present

## 2019-03-28 DIAGNOSIS — F419 Anxiety disorder, unspecified: Secondary | ICD-10-CM | POA: Diagnosis present

## 2019-03-28 DIAGNOSIS — A6000 Herpesviral infection of urogenital system, unspecified: Secondary | ICD-10-CM | POA: Diagnosis present

## 2019-03-28 DIAGNOSIS — Z3A39 39 weeks gestation of pregnancy: Secondary | ICD-10-CM

## 2019-03-28 DIAGNOSIS — O1002 Pre-existing essential hypertension complicating childbirth: Secondary | ICD-10-CM | POA: Diagnosis present

## 2019-03-28 DIAGNOSIS — O9832 Other infections with a predominantly sexual mode of transmission complicating childbirth: Secondary | ICD-10-CM | POA: Diagnosis present

## 2019-03-28 DIAGNOSIS — O9081 Anemia of the puerperium: Secondary | ICD-10-CM | POA: Diagnosis not present

## 2019-03-28 DIAGNOSIS — I10 Essential (primary) hypertension: Secondary | ICD-10-CM | POA: Diagnosis present

## 2019-03-28 LAB — ABO/RH: ABO/RH(D): B POS

## 2019-03-28 LAB — RPR: RPR Ser Ql: NONREACTIVE

## 2019-03-28 LAB — TYPE AND SCREEN
ABO/RH(D): B POS
Antibody Screen: NEGATIVE

## 2019-03-28 LAB — CBC
HCT: 32.1 % — ABNORMAL LOW (ref 36.0–46.0)
Hemoglobin: 10.7 g/dL — ABNORMAL LOW (ref 12.0–15.0)
MCH: 29.3 pg (ref 26.0–34.0)
MCHC: 33.3 g/dL (ref 30.0–36.0)
MCV: 87.9 fL (ref 80.0–100.0)
Platelets: 277 10*3/uL (ref 150–400)
RBC: 3.65 MIL/uL — ABNORMAL LOW (ref 3.87–5.11)
RDW: 13.9 % (ref 11.5–15.5)
WBC: 12.9 10*3/uL — ABNORMAL HIGH (ref 4.0–10.5)
nRBC: 0 % (ref 0.0–0.2)

## 2019-03-28 SURGERY — Surgical Case
Anesthesia: Spinal | Site: Abdomen | Wound class: Clean Contaminated

## 2019-03-28 MED ORDER — FENTANYL CITRATE (PF) 100 MCG/2ML IJ SOLN: 25.0000 ug | INTRAMUSCULAR | Status: DC | PRN

## 2019-03-28 MED ORDER — NALBUPHINE HCL 10 MG/ML IJ SOLN: 5.0000 mg | INTRAMUSCULAR | Status: DC | PRN

## 2019-03-28 MED ORDER — PHENYLEPHRINE 40 MCG/ML (10ML) SYRINGE FOR IV PUSH (FOR BLOOD PRESSURE SUPPORT)
PREFILLED_SYRINGE | INTRAVENOUS | Status: AC
  Filled 2019-03-28: qty 10

## 2019-03-28 MED ORDER — FENTANYL CITRATE (PF) 100 MCG/2ML IJ SOLN
INTRAMUSCULAR | Status: DC | PRN
  Administered 2019-03-28: 35 ug via INTRAVENOUS
  Administered 2019-03-28: 15 ug via INTRATHECAL
  Administered 2019-03-28 (×2): 25 ug via INTRAVENOUS

## 2019-03-28 MED ORDER — BUPIVACAINE HCL (PF) 0.25 % IJ SOLN
INTRAMUSCULAR | Status: AC
  Filled 2019-03-28: qty 30

## 2019-03-28 MED ORDER — KETOROLAC TROMETHAMINE 30 MG/ML IJ SOLN
INTRAMUSCULAR | Status: DC | PRN
  Administered 2019-03-28: 30 mg via INTRAMUSCULAR

## 2019-03-28 MED ORDER — MEPERIDINE HCL 25 MG/ML IJ SOLN: 6.2500 mg | INTRAMUSCULAR | Status: DC | PRN

## 2019-03-28 MED ORDER — ONDANSETRON HCL 4 MG/2ML IJ SOLN: 4.0000 mg | Freq: Four times a day (QID) | INTRAMUSCULAR | Status: DC | PRN

## 2019-03-28 MED ORDER — SIMETHICONE 80 MG PO CHEW
80.0000 mg | CHEWABLE_TABLET | ORAL | Status: DC
  Administered 2019-03-29 (×2): 80 mg via ORAL
  Filled 2019-03-28 (×2): qty 1

## 2019-03-28 MED ORDER — OXYCODONE HCL 5 MG/5ML PO SOLN: 5.0000 mg | Freq: Once | ORAL | Status: DC | PRN

## 2019-03-28 MED ORDER — IBUPROFEN 800 MG PO TABS
800.0000 mg | ORAL_TABLET | Freq: Three times a day (TID) | ORAL | Status: DC
  Administered 2019-03-28 – 2019-03-30 (×6): 800 mg via ORAL
  Filled 2019-03-28 (×6): qty 1

## 2019-03-28 MED ORDER — NALOXONE HCL 4 MG/10ML IJ SOLN
1.0000 ug/kg/h | INTRAVENOUS | Status: DC | PRN
  Filled 2019-03-28: qty 5

## 2019-03-28 MED ORDER — COCONUT OIL OIL
1.0000 "application " | TOPICAL_OIL | Status: DC | PRN
  Administered 2019-03-30: 1 via TOPICAL

## 2019-03-28 MED ORDER — ONDANSETRON HCL 4 MG/2ML IJ SOLN
INTRAMUSCULAR | Status: DC | PRN
  Administered 2019-03-28 (×2): 4 mg via INTRAVENOUS

## 2019-03-28 MED ORDER — BUPIVACAINE IN DEXTROSE 0.75-8.25 % IT SOLN
INTRATHECAL | Status: DC | PRN
  Administered 2019-03-28: 2 mL via INTRATHECAL

## 2019-03-28 MED ORDER — FENTANYL CITRATE (PF) 100 MCG/2ML IJ SOLN
INTRAMUSCULAR | Status: AC
  Filled 2019-03-28: qty 2

## 2019-03-28 MED ORDER — DEXTROSE 5 % IV SOLN
INTRAVENOUS | Status: AC
  Filled 2019-03-28: qty 3000

## 2019-03-28 MED ORDER — NALOXONE HCL 0.4 MG/ML IJ SOLN: 0.4000 mg | INTRAMUSCULAR | Status: DC | PRN

## 2019-03-28 MED ORDER — OXYCODONE HCL 5 MG PO TABS
5.0000 mg | ORAL_TABLET | ORAL | Status: DC | PRN
  Administered 2019-03-29: 10 mg via ORAL
  Administered 2019-03-29: 05:00:00 5 mg via ORAL
  Administered 2019-03-29 (×2): 10 mg via ORAL
  Administered 2019-03-30: 5 mg via ORAL
  Filled 2019-03-28 (×2): qty 2
  Filled 2019-03-28: qty 1
  Filled 2019-03-28: qty 2
  Filled 2019-03-28: qty 1

## 2019-03-28 MED ORDER — SCOPOLAMINE 1 MG/3DAYS TD PT72
1.0000 | MEDICATED_PATCH | Freq: Once | TRANSDERMAL | Status: DC
  Administered 2019-03-28: 07:00:00 1.5 mg via TRANSDERMAL

## 2019-03-28 MED ORDER — DIBUCAINE (PERIANAL) 1 % EX OINT: 1.0000 "application " | TOPICAL_OINTMENT | CUTANEOUS | Status: DC | PRN

## 2019-03-28 MED ORDER — SODIUM CHLORIDE 0.9 % IV SOLN
INTRAVENOUS | Status: DC | PRN
  Administered 2019-03-28: 08:00:00 via INTRAVENOUS

## 2019-03-28 MED ORDER — ONDANSETRON HCL 4 MG/2ML IJ SOLN
INTRAMUSCULAR | Status: AC
  Filled 2019-03-28: qty 2

## 2019-03-28 MED ORDER — ALBUMIN HUMAN 5 % IV SOLN
INTRAVENOUS | Status: DC | PRN
  Administered 2019-03-28: 09:00:00 via INTRAVENOUS

## 2019-03-28 MED ORDER — DEXAMETHASONE SODIUM PHOSPHATE 10 MG/ML IJ SOLN
INTRAMUSCULAR | Status: DC | PRN
  Administered 2019-03-28: 5 mg via INTRAVENOUS

## 2019-03-28 MED ORDER — PHENYLEPHRINE 40 MCG/ML (10ML) SYRINGE FOR IV PUSH (FOR BLOOD PRESSURE SUPPORT)
PREFILLED_SYRINGE | INTRAVENOUS | Status: DC | PRN
  Administered 2019-03-28 (×4): 80 ug via INTRAVENOUS

## 2019-03-28 MED ORDER — PRENATAL MULTIVITAMIN CH
1.0000 | ORAL_TABLET | Freq: Every day | ORAL | Status: DC
  Administered 2019-03-28 – 2019-03-29 (×2): 1 via ORAL
  Filled 2019-03-28 (×2): qty 1

## 2019-03-28 MED ORDER — MENTHOL 3 MG MT LOZG: 1.0000 | LOZENGE | OROMUCOSAL | Status: DC | PRN

## 2019-03-28 MED ORDER — SIMETHICONE 80 MG PO CHEW
80.0000 mg | CHEWABLE_TABLET | Freq: Three times a day (TID) | ORAL | Status: DC
  Administered 2019-03-28 – 2019-03-30 (×5): 80 mg via ORAL
  Filled 2019-03-28 (×5): qty 1

## 2019-03-28 MED ORDER — LORATADINE 10 MG PO TABS
10.0000 mg | ORAL_TABLET | Freq: Every day | ORAL | Status: DC
  Administered 2019-03-28 – 2019-03-30 (×3): 10 mg via ORAL
  Filled 2019-03-28 (×3): qty 1

## 2019-03-28 MED ORDER — ZOLPIDEM TARTRATE 5 MG PO TABS: 5.0000 mg | ORAL_TABLET | Freq: Every evening | ORAL | Status: DC | PRN

## 2019-03-28 MED ORDER — ACETAMINOPHEN 500 MG PO TABS
1000.0000 mg | ORAL_TABLET | Freq: Four times a day (QID) | ORAL | Status: DC
  Administered 2019-03-28 – 2019-03-30 (×8): 1000 mg via ORAL
  Filled 2019-03-28 (×9): qty 2

## 2019-03-28 MED ORDER — ONDANSETRON HCL 4 MG/2ML IJ SOLN: 4.0000 mg | Freq: Three times a day (TID) | INTRAMUSCULAR | Status: DC | PRN

## 2019-03-28 MED ORDER — SODIUM CHLORIDE 0.9 % IV SOLN
INTRAVENOUS | Status: DC | PRN
  Administered 2019-03-28: 08:00:00 60 ug/min via INTRAVENOUS

## 2019-03-28 MED ORDER — SENNOSIDES-DOCUSATE SODIUM 8.6-50 MG PO TABS
2.0000 | ORAL_TABLET | ORAL | Status: DC
  Administered 2019-03-29 (×2): 2 via ORAL
  Filled 2019-03-28 (×2): qty 2

## 2019-03-28 MED ORDER — LACTATED RINGERS IV SOLN
INTRAVENOUS | Status: DC
  Administered 2019-03-28 (×3): via INTRAVENOUS

## 2019-03-28 MED ORDER — VALACYCLOVIR HCL 500 MG PO TABS
1000.0000 mg | ORAL_TABLET | Freq: Every day | ORAL | Status: DC
  Administered 2019-03-28 – 2019-03-30 (×3): 1000 mg via ORAL
  Filled 2019-03-28 (×3): qty 2

## 2019-03-28 MED ORDER — MORPHINE SULFATE (PF) 0.5 MG/ML IJ SOLN
INTRAMUSCULAR | Status: AC
  Filled 2019-03-28: qty 10

## 2019-03-28 MED ORDER — STERILE WATER FOR IRRIGATION IR SOLN
Status: DC | PRN
  Administered 2019-03-28: 1000 mL

## 2019-03-28 MED ORDER — CEFAZOLIN SODIUM-DEXTROSE 2-4 GM/100ML-% IV SOLN
INTRAVENOUS | Status: AC
  Filled 2019-03-28: qty 100

## 2019-03-28 MED ORDER — SODIUM CHLORIDE 0.9 % IR SOLN
Status: DC | PRN
  Administered 2019-03-28: 1000 mL

## 2019-03-28 MED ORDER — DIPHENHYDRAMINE HCL 50 MG/ML IJ SOLN
INTRAMUSCULAR | Status: DC | PRN
  Administered 2019-03-28: 12.5 mg via INTRAVENOUS

## 2019-03-28 MED ORDER — OXYTOCIN 40 UNITS IN NORMAL SALINE INFUSION - SIMPLE MED: 2.5000 [IU]/h | INTRAVENOUS | Status: AC

## 2019-03-28 MED ORDER — BUPIVACAINE HCL (PF) 0.25 % IJ SOLN
INTRAMUSCULAR | Status: DC | PRN
  Administered 2019-03-28: 10 mL

## 2019-03-28 MED ORDER — NALBUPHINE HCL 10 MG/ML IJ SOLN: 5.0000 mg | Freq: Once | INTRAMUSCULAR | Status: DC | PRN

## 2019-03-28 MED ORDER — DEXTROSE 5 % IV SOLN
3.0000 g | INTRAVENOUS | Status: AC
  Administered 2019-03-28: 08:00:00 3 g via INTRAVENOUS
  Filled 2019-03-28 (×2): qty 3000

## 2019-03-28 MED ORDER — OXYTOCIN 40 UNITS IN NORMAL SALINE INFUSION - SIMPLE MED
INTRAVENOUS | Status: AC
  Filled 2019-03-28: qty 1000

## 2019-03-28 MED ORDER — MORPHINE SULFATE (PF) 0.5 MG/ML IJ SOLN
INTRAMUSCULAR | Status: DC | PRN
  Administered 2019-03-28: .15 mg via INTRATHECAL

## 2019-03-28 MED ORDER — KETOROLAC TROMETHAMINE 30 MG/ML IJ SOLN: 30.0000 mg | Freq: Four times a day (QID) | INTRAMUSCULAR | Status: AC | PRN

## 2019-03-28 MED ORDER — LACTATED RINGERS IV SOLN
INTRAVENOUS | Status: DC
  Administered 2019-03-28 (×2): via INTRAVENOUS

## 2019-03-28 MED ORDER — DIPHENHYDRAMINE HCL 50 MG/ML IJ SOLN: 12.5000 mg | INTRAMUSCULAR | Status: DC | PRN

## 2019-03-28 MED ORDER — LABETALOL HCL 100 MG PO TABS
100.0000 mg | ORAL_TABLET | Freq: Every day | ORAL | Status: DC
  Administered 2019-03-28 – 2019-03-29 (×2): 100 mg via ORAL
  Filled 2019-03-28 (×2): qty 1

## 2019-03-28 MED ORDER — DIPHENHYDRAMINE HCL 25 MG PO CAPS: 25.0000 mg | ORAL_CAPSULE | Freq: Four times a day (QID) | ORAL | Status: DC | PRN

## 2019-03-28 MED ORDER — LEVOTHYROXINE SODIUM 75 MCG PO TABS
125.0000 ug | ORAL_TABLET | Freq: Every day | ORAL | Status: DC
  Administered 2019-03-29 – 2019-03-30 (×2): 125 ug via ORAL
  Filled 2019-03-28 (×2): qty 1

## 2019-03-28 MED ORDER — PANTOPRAZOLE SODIUM 40 MG PO TBEC
40.0000 mg | DELAYED_RELEASE_TABLET | Freq: Every day | ORAL | Status: DC
  Administered 2019-03-28 – 2019-03-29 (×2): 40 mg via ORAL
  Filled 2019-03-28 (×3): qty 1

## 2019-03-28 MED ORDER — PHENYLEPHRINE HCL-NACL 20-0.9 MG/250ML-% IV SOLN
INTRAVENOUS | Status: AC
  Filled 2019-03-28: qty 250

## 2019-03-28 MED ORDER — ALBUMIN HUMAN 5 % IV SOLN
INTRAVENOUS | Status: AC
  Filled 2019-03-28: qty 250

## 2019-03-28 MED ORDER — WITCH HAZEL-GLYCERIN EX PADS: 1.0000 "application " | MEDICATED_PAD | CUTANEOUS | Status: DC | PRN

## 2019-03-28 MED ORDER — OXYCODONE HCL 5 MG PO TABS: 5.0000 mg | ORAL_TABLET | Freq: Once | ORAL | Status: DC | PRN

## 2019-03-28 MED ORDER — SODIUM CHLORIDE 0.9 % IV SOLN
INTRAVENOUS | Status: DC | PRN
  Administered 2019-03-28: 40 [IU] via INTRAVENOUS

## 2019-03-28 MED ORDER — SODIUM CHLORIDE 0.9% FLUSH: 3.0000 mL | INTRAVENOUS | Status: DC | PRN

## 2019-03-28 MED ORDER — DEXAMETHASONE SODIUM PHOSPHATE 10 MG/ML IJ SOLN
INTRAMUSCULAR | Status: AC
  Filled 2019-03-28: qty 1

## 2019-03-28 MED ORDER — KETOROLAC TROMETHAMINE 30 MG/ML IJ SOLN
INTRAMUSCULAR | Status: AC
  Filled 2019-03-28: qty 1

## 2019-03-28 MED ORDER — SIMETHICONE 80 MG PO CHEW: 80.0000 mg | CHEWABLE_TABLET | ORAL | Status: DC | PRN

## 2019-03-28 MED ORDER — DIPHENHYDRAMINE HCL 25 MG PO CAPS: 25.0000 mg | ORAL_CAPSULE | ORAL | Status: DC | PRN

## 2019-03-28 MED ORDER — SCOPOLAMINE 1 MG/3DAYS TD PT72
MEDICATED_PATCH | TRANSDERMAL | Status: AC
  Filled 2019-03-28: qty 1

## 2019-03-28 SURGICAL SUPPLY — 37 items
BARRIER ADHS 3X4 INTERCEED (GAUZE/BANDAGES/DRESSINGS) ×2 IMPLANT
BENZOIN TINCTURE PRP APPL 2/3 (GAUZE/BANDAGES/DRESSINGS) ×2 IMPLANT
CHLORAPREP W/TINT 26ML (MISCELLANEOUS) ×2 IMPLANT
CLAMP CORD UMBIL (MISCELLANEOUS) ×2 IMPLANT
CLOTH BEACON ORANGE TIMEOUT ST (SAFETY) ×2 IMPLANT
DRAPE C SECTION CLR SCREEN (DRAPES) ×2 IMPLANT
DRSG OPSITE POSTOP 4X10 (GAUZE/BANDAGES/DRESSINGS) ×2 IMPLANT
ELECT REM PT RETURN 9FT ADLT (ELECTROSURGICAL) ×2
ELECTRODE REM PT RTRN 9FT ADLT (ELECTROSURGICAL) ×1 IMPLANT
GLOVE BIOGEL PI IND STRL 7.0 (GLOVE) ×2 IMPLANT
GLOVE BIOGEL PI INDICATOR 7.0 (GLOVE) ×2
GLOVE ECLIPSE 6.5 STRL STRAW (GLOVE) ×2 IMPLANT
GOWN STRL REUS W/TWL LRG LVL3 (GOWN DISPOSABLE) ×4 IMPLANT
HOVERMATT SINGLE USE (MISCELLANEOUS) ×2 IMPLANT
NEEDLE HYPO 22GX1.5 SAFETY (NEEDLE) ×2 IMPLANT
NS IRRIG 1000ML POUR BTL (IV SOLUTION) ×2 IMPLANT
PACK C SECTION WH (CUSTOM PROCEDURE TRAY) ×2 IMPLANT
PAD OB MATERNITY 4.3X12.25 (PERSONAL CARE ITEMS) ×2 IMPLANT
RETRACTOR TRAXI PANNICULUS (MISCELLANEOUS) ×1 IMPLANT
RTRCTR C-SECT PINK 25CM LRG (MISCELLANEOUS) ×2 IMPLANT
STRIP CLOSURE SKIN 1/2X4 (GAUZE/BANDAGES/DRESSINGS) ×2 IMPLANT
SUT MNCRL 0 VIOLET CTX 36 (SUTURE) ×3 IMPLANT
SUT MONOCRYL 0 CTX 36 (SUTURE) ×3
SUT PLAIN 2 0 (SUTURE) ×1
SUT PLAIN 2 0 XLH (SUTURE) ×2 IMPLANT
SUT PLAIN ABS 2-0 CT1 27XMFL (SUTURE) ×1 IMPLANT
SUT VIC AB 0 CT1 27 (SUTURE) ×1
SUT VIC AB 0 CT1 27XBRD ANBCTR (SUTURE) ×1 IMPLANT
SUT VIC AB 0 CT1 36 (SUTURE) ×4 IMPLANT
SUT VIC AB 2-0 CT1 27 (SUTURE) ×1
SUT VIC AB 2-0 CT1 TAPERPNT 27 (SUTURE) ×1 IMPLANT
SUT VIC AB 4-0 KS 27 (SUTURE) ×2 IMPLANT
SYR CONTROL 10ML LL (SYRINGE) ×2 IMPLANT
TOWEL OR 17X24 6PK STRL BLUE (TOWEL DISPOSABLE) ×2 IMPLANT
TRAXI PANNICULUS RETRACTOR (MISCELLANEOUS) ×1
TRAY FOLEY W/BAG SLVR 14FR LF (SET/KITS/TRAYS/PACK) ×2 IMPLANT
WATER STERILE IRR 1000ML POUR (IV SOLUTION) ×2 IMPLANT

## 2019-03-28 NOTE — Op Note (Signed)
NAME: Jacqueline Peterson, TENNY MEDICAL RECORD YI:[id number] ACCOUNT [id number] DATE OF BIRTH:1986-03-12 FACILITY: [facility] LOCATION: [place] PHYSICIAN:Kamla Skilton A. Maryagnes Carrasco, MD  OPERATIVE REPORT  DATE OF PROCEDURE:  03/28/2019  PREOPERATIVE DIAGNOSIS:  Previous cesarean section, term gestation, chronic hypertension, third trimester, hypothyroidism in pregnancy, morbid obesity.  PROCEDURE:  Repeat cesarean section, Kerr hysterotomy.  POSTOPERATIVE DIAGNOSIS:  Previous cesarean section, term gestation, chronic hypertension, third trimester, hypothyroidism in pregnancy, morbid obesity.  ANESTHESIA:  Spinal.  SURGEON:  Servando Salina, MD  ASSISTANT:  Lars Pinks, CNM.  DESCRIPTION OF PROCEDURE:  Under adequate spinal anesthesia, the patient was placed in the supine position with a left lateral tilt.  She was sterilely prepped and draped in usual fashion.  An indwelling Foley catheter was sterilely placed.  Marcaine  0.25% was injected along the previous Pfannenstiel skin incision site.  Pfannenstiel skin incision was then made, carried down to the rectus fascia.  Rectus fascia was opened transversely.  Rectus fascia was then bluntly and sharply dissected off the  rectus muscle in a superior and inferior fashion.  The rectus muscle split in the midline.  The parietal peritoneum was entered bluntly and extended.  A self-retaining Alexis retractor was then placed.  A bladder adhesion was noted.  Vesicouterine peritoneum was opened transversely.  The bladder was gently displaced inferiorly.  A curvilinear low transverse incision was then made and extended bluntly in a cephalic and caudad fashion.  Light meconium fluid with floating vernix was noted.   Artificial rupture of membranes occurred.  Subsequent delivery of a live female from the left occiput transverse position with a nuchal cord x1.  Baby was bulb suctioned on the abdomen.  Cord was reduced.  Delayed clamp cord x1 minute was done.  Baby  was  transferred to the awaiting pediatricians who assigned Apgars of 9 and 9 at one and five minutes.  Placenta was manually removed.  Uterine cavity was cleaned of debris.  Uterine incision had no extension was closed in 2 layers, the first layer 0  Monocryl running lock stitch, second layer was imbricating using 0 Monocryl sutures.  Normal tubes and ovaries were noted bilaterally.  The abdomen was irrigated and suctioned of debris.  Interceed was placed in an inverted fashion overlying the incision.  The Alexis retractor was then removed.  The parietal peritoneum was closed with 2-0 Vicryl.  The rectus fascia was closed with 0 Vicryl x2.  The subcutaneous area was irrigated, small bleeders cauterized.  Interrupted 2-0 plain sutures placed  and the skin approximated using 4-0 Vicryl subcuticular closure.  Steri-Strips and benzoin was placed.  A honeycomb dressing was placed.  SPECIMEN:  Placenta not sent to pathology.  ESTIMATED BLOOD LOSS:  171 mL.  URINE OUTPUT:  100 mL clear yellow urine.  INTRAOPERATIVE FLUIDS:  2500 mL  COUNTS:  Sponge and instrument counts x2 was correct.  COMPLICATIONS:  None.  DISPOSITION:  The patient tolerated the procedure well and was transferred to recovery room in stable condition.  TN/NUANCE  D:03/28/2019 T:03/28/2019 JOB:[id number]

## 2019-03-28 NOTE — Brief Op Note (Signed)
03/28/2019  8:43 AM  PATIENT:  Jacqueline Peterson  33 y.o. female  PRE-OPERATIVE DIAGNOSIS:  Previous Cesarean Section, term gestation, morbid obesity, chronic HTN in 3rd trimester, hypothyroidism complicating pregnancy  POST-OPERATIVE DIAGNOSIS:  Previous Cesarean Section, term gestation, chronic HTN in third trimester, hypothyroidism, morbid obesity  PROCEDURE:  Repeat Cesarean section, kerr hysterotomy  SURGEON:  Surgeon(s) and Role:    * Servando Salina, MD - Primary  PHYSICIAN ASSISTANT:   ASSISTANTS: Lars Pinks, CNM   ANESTHESIA:   spinal Findings: live female LOT, CAN x 1, nl tubes, ovaries, manual extraction of placenta EBL:  171 mL   BLOOD ADMINISTERED:none  DRAINS: none   LOCAL MEDICATIONS USED:  MARCAINE     SPECIMEN:  Source of Specimen:  placenta  DISPOSITION OF SPECIMEN:  N/A  COUNTS:  YES  TOURNIQUET:  * No tourniquets in log *  DICTATION: .Other Dictation: Dictation Number [id number]  PLAN OF CARE: Admit to inpatient   PATIENT DISPOSITION:  PACU - hemodynamically stable.   Delay start of Pharmacological VTE agent (>24hrs) due to surgical blood loss or risk of bleeding: no

## 2019-03-28 NOTE — Transfer of Care (Signed)
Immediate Anesthesia Transfer of Care Note  Patient: Jacqueline Peterson  Procedure(s) Performed: Repeat CESAREAN SECTION (N/A Abdomen)  Patient Location: PACU  Anesthesia Type:Spinal  Level of Consciousness: awake, alert  and oriented  Airway & Oxygen Therapy: Patient Spontanous Breathing  Post-op Assessment: Report given to RN and Post -op Vital signs reviewed and stable  Post vital signs: Reviewed and stable  Last Vitals:  Vitals Value Taken Time  BP 114/72 03/28/2019  9:02 AM  Temp    Pulse 103 03/28/2019  9:05 AM  Resp 19 03/28/2019  9:05 AM  SpO2 99 % 03/28/2019  9:05 AM  Vitals shown include unvalidated device data.  Last Pain:  Vitals:   03/28/19 0902  TempSrc: (P) Oral         Complications: No apparent anesthesia complications

## 2019-03-28 NOTE — Consult Note (Signed)
[place]  03/28/2019  8:15 AM  Delivery Note:  C-section       SELAM PIETSCH        MRN:  [id number]  Date/Time of Birth: Feb 29, 1986   Birth GA:  Gestational Age: <None>  I was called to the operating room at the request of the patient's obstetrician (Dr. Garwin Brothers) due to c/s at term.  PRENATAL HX:  Chronic hypertension, hypothyroidism, h/o HSV, and morbid obesity.  INTRAPARTUM HX:   No labor.  Admitted today for repeat c/s at term.  DELIVERY:   Uncomplicated c/s.  Vigorous newborn.  Delayed cord clamping x 1 minute.  I observed the delivery and the baby from the operative site to the radiant warmer from the adjoining resuscitation room (reducing direct patient contact due to coronavirus pandemic).  Baby directly attended to by our respiratory therapist and the newborn nursery nurse.  After 5 minutes, baby was left with nurse to assist parents with skin-to-skin care. _____________________ Berenice Bouton, MD Neonatal Medicine

## 2019-03-28 NOTE — Anesthesia Procedure Notes (Signed)
Spinal  Patient location during procedure: OR Start time: 03/28/2019 7:43 AM End time: 03/28/2019 7:46 AM Staffing Anesthesiologist: Albertha Ghee, MD Performed: anesthesiologist  Preanesthetic Checklist Completed: patient identified, surgical consent, pre-op evaluation, timeout performed, IV checked, risks and benefits discussed and monitors and equipment checked Spinal Block Patient position: sitting Prep: DuraPrep Patient monitoring: cardiac monitor, continuous pulse ox and blood pressure Approach: midline Location: L3-4 Injection technique: single-shot Needle Needle type: Spinocan  Needle gauge: 22 G Needle length: 10 cm Assessment Sensory level: T10 Additional Notes Functioning IV was confirmed and monitors were applied. Sterile prep and drape, including hand hygiene and sterile gloves were used. The patient was positioned and the spine was prepped. The skin was anesthetized with lidocaine.  Free flow of clear CSF was obtained prior to injecting local anesthetic into the CSF.  The spinal needle aspirated freely following injection.  The needle was carefully withdrawn.  The patient tolerated the procedure well.

## 2019-03-28 NOTE — Lactation Note (Signed)
This note was copied from a baby's chart. Lactation Consultation Note  Patient Name: Jacqueline Peterson [id number] Date: 03/28/2019 Reason for consult: Initial assessment;Term;1st time breastfeeding  P2 mother whose infant is now 20 hours old.  Mother did not breast feed her first child.  Baby was asleep in father's arms when I arrived.  Mother had no immediate questions/concerns related to breast feeding.  Taught hand expression and mother was able to express one drop of colostrum from left breast.  Colostrum container provided and milk storage times reviewed.  Finger feeding demonstrated.  Mother's breasts are wide spaced and non tender with everted nipples.  Baby started showing cues and I offered to assist with latching.  Mother accepted.  Assisted baby to latch on the right breast in the football hold.  She had a wide gape and flanged lips.  Mother denied pain with latching.  Demonstrated techniques to keep baby awake at the breast.  She required very little stimulation to continue sucking.  Observed her feeding well for 5 minutes prior to my departure.  Encouraged mother to feed 8-12 times/24 hours or sooner if baby shows feeding cues.  Reviewed cues.  Mother will call for latch assistance as needed.  Mom made aware of O/P services, breastfeeding support groups, community resources, and our phone # for post-discharge questions.  Mother will return to work in 15 weeks and has a DEBP for home use.  Father present.   .Maternal Data Formula Feeding for Exclusion: No Has patient been taught Hand Expression?: Yes Does the patient have breastfeeding experience prior to this delivery?: No  Feeding Feeding Type: Breast Fed  LATCH Score Latch: Grasps breast easily, tongue down, lips flanged, rhythmical sucking.  Audible Swallowing: None  Type of Nipple: Everted at rest and after stimulation  Comfort (Breast/Nipple): Soft / non-tender  Hold (Positioning): Assistance needed to correctly  position infant at breast and maintain latch.  LATCH Score: 7  Interventions Interventions: Breast feeding basics reviewed;Assisted with latch;Skin to skin;Breast massage;Hand express;Breast compression;Position options;Support pillows;Adjust position  Lactation Tools Discussed/Used     Consult Status Consult Status: Follow-up Date: 03/29/19 Follow-up type: In-patient    Chanya Chrisley R Carmin Dibartolo 03/28/2019, 1:05 PM

## 2019-03-28 NOTE — Anesthesia Preprocedure Evaluation (Signed)
Anesthesia Evaluation  Patient identified by MRN, date of birth, ID band Patient awake    Reviewed: Allergy & Precautions, H&P , NPO status , Patient's Chart, lab work & pertinent test results  Airway Mallampati: II   Neck ROM: full    Dental   Pulmonary former smoker,    breath sounds clear to auscultation       Cardiovascular hypertension,  Rhythm:regular Rate:Normal     Neuro/Psych PSYCHIATRIC DISORDERS Anxiety Depression    GI/Hepatic GERD  ,  Endo/Other  Hypothyroidism Morbid obesity  Renal/GU      Musculoskeletal   Abdominal   Peds  Hematology   Anesthesia Other Findings   Reproductive/Obstetrics (+) Pregnancy H/o prior C/S                             Anesthesia Physical Anesthesia Plan  ASA: II  Anesthesia Plan: Spinal   Post-op Pain Management:    Induction: Intravenous  PONV Risk Score and Plan: 2 and Ondansetron and Treatment may vary due to age or medical condition  Airway Management Planned: Simple Face Mask  Additional Equipment:   Intra-op Plan:   Post-operative Plan:   Informed Consent: I have reviewed the patients History and Physical, chart, labs and discussed the procedure including the risks, benefits and alternatives for the proposed anesthesia with the patient or authorized representative who has indicated his/her understanding and acceptance.       Plan Discussed with: CRNA, Anesthesiologist and Surgeon  Anesthesia Plan Comments:         Anesthesia Quick Evaluation

## 2019-03-28 NOTE — H&P (Signed)
TRANIYAH HALLETT is a 33 y.o. female presenting for  Repeat Cesarean section at term. PNC complicated by. Chronic HTN, hypothyroidism, hx HSV, morbid obesity OB History    Gravida  2   Para  1   Term  1   Preterm      AB      Living  1     SAB      TAB      Ectopic      Multiple      Live Births  1          Past Medical History:  Diagnosis Date  . Complication of anesthesia    felt part of surgery with epidural  . GERD (gastroesophageal reflux disease)   . History of chicken pox   . HSV infection   . Hypertension   . Hypothyroidism    Past Surgical History:  Procedure Laterality Date  . CESAREAN SECTION N/A 07/16/2016   Procedure: CESAREAN SECTION;  Surgeon: Chancy Milroy, MD;  Location: [facility];  Service: Obstetrics;  Laterality: N/A;  . NO PAST SURGERIES     Family History: family history includes ADD / ADHD in her sister; Aneurysm in her maternal grandmother; Anxiety disorder in her maternal uncle; Basal cell carcinoma in her mother; Breast cancer in her maternal aunt; Cancer in an other family member; Cancer (age of onset: 12) in her father; Diabetes in her maternal grandmother and paternal grandfather; Early death in her father; Fibroids in her mother; Heart disease in her maternal grandfather, paternal grandfather, and paternal grandmother; Hypertension in her father, maternal grandfather, maternal uncle, mother, paternal grandfather, and paternal grandmother; Kidney disease in her maternal uncle; Lymphoma in her paternal grandfather; Pancreatitis in her maternal grandmother; Rheum arthritis in an other family member; Rheumatic fever in her maternal grandfather; Stroke in her paternal grandmother; Thyroid disease in her mother and paternal grandfather. Social History:  reports that she quit smoking about 4 years ago. She has a 3.00 pack-year smoking history. She has never used smokeless tobacco. She reports current alcohol use. She reports that she  does not use drugs.     Maternal Diabetes: No Genetic Screening: Normal Maternal Ultrasounds/Referrals: Normal Fetal Ultrasounds or other Referrals:  None Maternal Substance Abuse:  No Significant Maternal Medications:  Meds include: Protonix Syntroid Other: labetalol, valtrex, baby asa Significant Maternal Lab Results:  Lab values include: Group B Strep negative Other Comments:  chronic HTN, hypothyroidism, GERD, hx HSV  Review of Systems  All other systems reviewed and are negative.  Maternal Medical History:  Fetal activity: Perceived fetal activity is normal.    Prenatal complications: PIH.   hypothyroidism  Prenatal Complications - Diabetes: none.      Blood pressure (!) 148/94, pulse (!) 105, temperature 97.8 F (36.6 C), temperature source Oral, resp. rate 18, height 5\' 3"  (1.6 m), weight 131.5 kg, last menstrual period 06/20/2018, SpO2 97 %. Maternal Exam:  Abdomen: Surgical scars: low transverse.   Fetal presentation: vertex     Physical Exam  Constitutional: She is oriented to person, place, and time. She appears well-developed and well-nourished.  HENT:  Head: Atraumatic.  Eyes: EOM are normal.  Neck: Neck supple.  Cardiovascular: Regular rhythm.  Respiratory: Breath sounds normal.  GI: Soft.  Obese. (+) pannus Pfannenstiel skin incision  Neurological: She is alert and oriented to person, place, and time.  Skin: Skin is warm and dry.  Psychiatric: She has a normal mood and affect.  Prenatal labs: ABO, Rh: B/Positive/-- (10/10 0000) Antibody: Negative (10/10 0000) Rubella: Immune (10/10 0000) RPR: Nonreactive (10/10 0000)  HBsAg: Negative (10/10 0000)  HIV: Non-reactive (10/10 0000)  GBS:   negative  Assessment/Plan: Previous Cesarean section Morbid obesity Chronic HTN in third trimester Hypothyroidism affecting  pregnancy  Term gestation P) repeat C/S. Risk of surgery reviewed including infection, bleeding, injury to bladder, bowel,  ureter, internal scar tissue, poss need for blood transfusion and its risk( HIV, hepatitis, acute rxn).  All ? answered  Akiel Fennell A Nephi Savage 03/28/2019, 6:16 AM

## 2019-03-29 LAB — CBC
HCT: 26.8 % — ABNORMAL LOW (ref 36.0–46.0)
Hemoglobin: 8.7 g/dL — ABNORMAL LOW (ref 12.0–15.0)
MCH: 29 pg (ref 26.0–34.0)
MCHC: 32.5 g/dL (ref 30.0–36.0)
MCV: 89.3 fL (ref 80.0–100.0)
Platelets: 244 10*3/uL (ref 150–400)
RBC: 3 MIL/uL — ABNORMAL LOW (ref 3.87–5.11)
RDW: 13.9 % (ref 11.5–15.5)
WBC: 15.4 10*3/uL — ABNORMAL HIGH (ref 4.0–10.5)
nRBC: 0 % (ref 0.0–0.2)

## 2019-03-29 LAB — BIRTH TISSUE RECOVERY COLLECTION (PLACENTA DONATION)

## 2019-03-29 NOTE — Progress Notes (Signed)
Subjective: Postpartum Day 1, repeat 2nd elective Cesarean Delivery at 39 wks. Girl  Patient reports incisional pain, + flatus and no problems voiding. Lochia minimal. Feels better than after 1st c/section.   Objective: BP 126/71 (BP Location: Right Arm)   Pulse 84   Temp 99 F (37.2 C) (Oral)   Resp 16   Ht 5\' 3"  (1.6 m)   Wt 131.5 kg   LMP 06/20/2018   SpO2 100%   Breastfeeding Unknown   BMI 51.35 kg/m   Vital signs in last 24 hours: Temp:  [98.4 F (36.9 C)-99.5 F (37.5 C)] 99 F (37.2 C) (05/13 0557) Pulse Rate:  [77-102] 84 (05/13 0557) Resp:  [15-18] 16 (05/13 0557) BP: (91-126)/(59-71) 126/71 (05/13 0557) SpO2:  [95 %-100 %] 100 % (05/13 0557)  Physical Exam:  General: alert and cooperative Lungs CTA bilat CV RRR Lochia: appropriate Uterine Fundus: firm. Abdomen large, soft.  Incision: Dressing is blood stained  DVT Evaluation: No evidence of DVT seen on physical exam.  CBC Latest Ref Rng & Units 03/29/2019 03/28/2019   WBC 4.0 - 10.5 K/uL 15.4(H) 12.9(H)   Hemoglobin 12.0 - 15.0 g/dL 8.7(L) 10.7(L)   Hematocrit 36.0 - 46.0 % 26.8(L) 32.1(L)   Platelets 150 - 400 K/uL 244 277    Rh pos Rub Immune  Assessment/Plan: Status post Cesarean section. Doing well postoperatively.  CHTN - low dose Labetalol, no dose changes in pregnancy and is stable, no new changes.  Hypothyroidism - changes dose twice in preg, continue current dose, needs labs at 6 wks, sooner if symptomatic  Anxiety - stable, no meds needed  Chronic anemia with ABL--- discussed IV Feraheme, IV access is lost, she rather not have new IV, will start PO Iron and continue alternate days as tolerated 3 mos   Baby GIRL, breast feeding. Stable with mom.  Elveria Royals 03/29/2019, 1:41 PM

## 2019-03-29 NOTE — Anesthesia Postprocedure Evaluation (Signed)
Anesthesia Post Note  Patient: Jacqueline Peterson  Procedure(s) Performed: Repeat CESAREAN SECTION (N/A Abdomen)     Patient location during evaluation: PACU Anesthesia Type: Spinal Level of consciousness: oriented and awake and alert Pain management: pain level controlled Vital Signs Assessment: post-procedure vital signs reviewed and stable Respiratory status: spontaneous breathing, respiratory function stable and patient connected to nasal cannula oxygen Cardiovascular status: blood pressure returned to baseline and stable Postop Assessment: no headache, no backache and no apparent nausea or vomiting Anesthetic complications: no    Last Vitals:  Vitals:   03/29/19 0225 03/29/19 0557  BP: (!) 91/59 126/71  Pulse: 89 84  Resp: 15 16  Temp: 36.9 C 37.2 C  SpO2: 100% 100%    Last Pain:  Vitals:   03/29/19 0901  TempSrc:   PainSc: [name]

## 2019-03-29 NOTE — Progress Notes (Signed)
CSW received consult for history of seasonal affective disorder and anxiety.  CSW met with [name] to offer support and complete assessment.    [name] sitting on couch, breastfeeding infant, when CSW entered the room. [name] also present and at bedside. CSW offered to come back at a later time but [name] welcoming of [place] visit. CSW introduced self and explained reason for consult and [name] nodded in understanding. Per [name], she was diagnosed with both anxiety and SAD around 2013. [name] reported she was taking medications prior to pregnancy and reported symptoms were manageable. [name] stated she plans to breastfeed so she plans to follow up with her PCP regarding restarting medications. [name] confirmed she felt comfortable reaching out if any symptoms present themselves. [name] denied having any current mental health symptoms or history of PPD. CSW provided brief education regarding the baby blues period vs. perinatal mood disorders. CSW recommended self-evaluation during the postpartum time period using the New Mom Checklist from Postpartum Progress and encouraged [name] to contact a medical professional if symptoms are noted at any time.  [name] reported feeling well supported by [name], her mother and her sister.   [name] denied having any further questions, concerns or need for resources at this time.  CSW identifies no further need for intervention and no barriers to discharge at this time.  Jacqueline Peterson, [place]  [facility] [employer] [phone]

## 2019-03-29 NOTE — Progress Notes (Signed)
Patient passing gas. Plan of care told to patient. Patient told to keep up with infant feedings. Patient told about pain medicine regimen. Patient encouraged to walk halls today. NO complaints at this time.

## 2019-03-29 NOTE — Lactation Note (Signed)
This note was copied from a baby's chart. Lactation Consultation Note  Patient Name: Jacqueline Peterson Date: 03/29/2019 Reason for consult: Follow-up assessment;Mother's request  [id number] - I responded to mother's request to be seen. She had fed baby "Jacqueline Peterson" prior to my entry, and dad was holding baby in his arms. Baby had a pacifier in the mouth.  Mom states that baby is latching well for 5-10 minute feedings. We discussed typical feeding frequency, feeding duration, and feeding cues in the first days of breast feeding. I educated mom on nighttime cluster feeding patterns as well.  Mom's nipples appear in tact but a little red in the center. She reports that her nipples were becoming sore, and she requested a nipple shield from her RN. She had a size 20. I showed her how to properly place her NS. Mom had some shields brought from home with her; we examined these, and discussed it, and agreed that the medela shields would work better.  I showed mom how to correctly make a breast sandwich to deepen baby's latch, and she was able to repeat back well. I encouraged her to try to offer the breast without her shield to maximize milk transfer and breast stimulation and to call if she needed help with establishing a deep latch. I recommended she use her NS PRN.  I discussed the risks of early use of a pacifier and recommended that mom and dad use it only as necessary. Dad removed the pacifier and baby remained asleep with no feeding cues. Mom and dad verbalized understanding.   I also shared our virtual community resources and discussed support options post discharge.   Maternal Data Formula Feeding for Exclusion: No Has patient been taught Hand Expression?: Yes Does the patient have breastfeeding experience prior to this delivery?: No(Baby #1 did not latch)  Feeding Feeding Type: Breast Fed  Interventions Interventions: Breast feeding basics reviewed  Lactation Tools  Discussed/Used   Consult Status Consult Status: Follow-up Date: 03/30/19 Follow-up type: In-patient    Harold Mattes 03/29/2019, 4:30 PM

## 2019-03-30 MED ORDER — COCONUT OIL OIL: 1.0000 "application " | TOPICAL_OIL | 0 refills | Status: DC | PRN

## 2019-03-30 MED ORDER — MAGNESIUM OXIDE -MG SUPPLEMENT 400 (240 MG) MG PO TABS: 400.0000 mg | ORAL_TABLET | Freq: Every day | ORAL | Status: DC

## 2019-03-30 MED ORDER — POLYSACCHARIDE IRON COMPLEX 150 MG PO CAPS: 150.0000 mg | ORAL_CAPSULE | Freq: Every day | ORAL | Status: DC

## 2019-03-30 MED ORDER — ACETAMINOPHEN 500 MG PO TABS: 1000.0000 mg | ORAL_TABLET | Freq: Four times a day (QID) | ORAL | 0 refills | Status: DC

## 2019-03-30 MED ORDER — IBUPROFEN 800 MG PO TABS: 800.0000 mg | ORAL_TABLET | Freq: Three times a day (TID) | ORAL | 0 refills | Status: DC

## 2019-03-30 MED ORDER — PRENATAL MULTIVITAMIN CH: 1.0000 | ORAL_TABLET | Freq: Every day | ORAL | Status: DC

## 2019-03-30 MED ORDER — ONDANSETRON 4 MG PO TBDP
4.0000 mg | ORAL_TABLET | Freq: Once | ORAL | Status: AC
  Administered 2019-03-30: 4 mg via ORAL

## 2019-03-30 MED ORDER — ONDANSETRON 4 MG PO TBDP
ORAL_TABLET | ORAL | Status: AC
  Administered 2019-03-30: 11:00:00
  Filled 2019-03-30: qty 1

## 2019-03-30 MED ORDER — OXYCODONE HCL 5 MG PO TABS: 5.0000 mg | ORAL_TABLET | ORAL | 0 refills | Status: DC | PRN

## 2019-03-30 MED FILL — IBUPROFEN 800 MG TAB: 800 | 10 days supply | Qty: 30 | Fill #0

## 2019-03-30 MED FILL — ACETAMINOPHEN 500 MG TABS: 500 | 13 days supply | Qty: 100 | Fill #0

## 2019-03-30 MED FILL — oxyCODONE HCL 5 MG TABS: 5 | 5 days supply | Qty: 30 | Fill #0

## 2019-03-30 NOTE — Progress Notes (Signed)
Subjective: POD# 2 Live born female  Birth Weight: 7 lb 3.5 oz (3275 g) APGAR: 9, 9  Newborn Delivery   Birth date/time:  03/28/2019 08:06:00 Delivery type:  C-Section, Low Transverse Trial of labor:  No C-section categorization:  Repeat    Baby name: Lucy Delivering provider: COUSINS, Alanda Slim   Feeding: breast  Pain control at delivery: Spinal   Reports feeling well, desires early DC  Patient reports tolerating PO.   Breast symptoms:+ colostrum Pain controlled with PO meds Denies HA/SOB/C/P/N/V/dizziness. Flatus present. She reports vaginal bleeding as normal, without clots.  She is ambulating, urinating without difficulty.     Objective:   VS:    Vitals:   03/29/19 0557 03/29/19 1444 03/29/19 2219 03/30/19 0530  BP: 126/71 119/71 (!) 142/73 119/69  Pulse: 84 86 72 76  Resp: 16 18 16 16   Temp: 99 F (37.2 C) 98.4 F (36.9 C) 98.8 F (37.1 C) 98.2 F (36.8 C)  TempSrc: Oral Oral Oral Oral  SpO2: 100% 99% 100% 97%  Weight:      Height:        No intake or output data in the 24 hours ending 03/30/19 0925      Recent Labs    03/28/19 0527 03/29/19 0537  WBC 12.9* 15.4*  HGB 10.7* 8.7*  HCT 32.1* 26.8*  PLT 277 244     Blood type: --/--/B POS, B POS (05/12 7035)  Rubella: Immune (10/10 0000)  Vaccines: TDaP UTD         Flu    declined   Physical Exam:  General: alert, cooperative and no distress Abdomen: soft, nontender, normal bowel sounds Incision: clean, dry and intact Uterine Fundus: firm, below umbilicus, nontender Lochia: minimal Ext: no edema, redness or tenderness in the calves or thighs      Assessment/Plan: 33 y.o.   POD# 2. [id number]                  Principal Problem:   Postpartum care following cesarean delivery (5/12) Active Problems:   Chronic hypertension  - low dose Labetalol, no dose changes in pregnancy and is stable, no new changes.    Hypothyroidism   - changes dose twice in preg, continue current dose, needs labs  at 6 wks, sooner if symptomatic    Anxiety   - stable, no meds needed    Chronic anemia with ABL--- discussed IV Feraheme, IV access is lost, she rather not have new IV, will start PO Iron and continue alternate days as tolerated 3 mos  Doing well, stable.               Advance diet as tolerated Encourage rest when baby rests Breastfeeding support Encourage to ambulate Routine post-op care May discharge home later today if baby is discharged from peds service.   Juliene Pina, CNM, MSN 03/30/2019, 9:25 AM

## 2019-03-30 NOTE — Discharge Summary (Addendum)
OB Discharge Summary  Patient Name: Jacqueline Peterson DOB: 1986/08/07 MRN: [id number]  Date of admission: 03/28/2019 Delivering provider: Othal Kubitz   Date of discharge: 03/30/2019  Admitting diagnosis: Previous Cesarean Sectionm term gestation Intrauterine pregnancy: [redacted]w[redacted]d     Secondary diagnosis: chronic HTN in pregnancy, hypothyroidism Additional problems: morbid obesity, hx HSV, IDA     Discharge diagnosis: term gestation delivered, repeat cesarean section( 5/12), Hypothyroidism , chronic HTN in preg, morbid obesity, IDA                                                               Post partum procedures:none  Augmentation: NA Pain control: Spinal  Laceration:None  Episiotomy:None  Complications: None   Hospital course:  Sceduled C/S   33 y.o. yo G2P2002 at [redacted]w[redacted]d was admitted to the hospital 03/28/2019 for scheduled cesarean section with the following indication:Elective Repeat.  Membrane Rupture Time/Date: 8:05 AM ,03/28/2019   Patient delivered a Viable infant.03/28/2019  Details of operation can be found in separate operative note.  Pateint had an uncomplicated postpartum course.  She is ambulating, tolerating a regular diet, passing flatus, and urinating well. Patient is discharged home in stable condition on  03/30/19         Physical exam  Vitals:   03/29/19 0557 03/29/19 1444 03/29/19 2219 03/30/19 0530  BP: 126/71 119/71 (!) 142/73 119/69  Pulse: 84 86 72 76  Resp: 16 18 16 16   Temp: 99 F (37.2 C) 98.4 F (36.9 C) 98.8 F (37.1 C) 98.2 F (36.8 C)  TempSrc: Oral Oral Oral Oral  SpO2: 100% 99% 100% 97%  Weight:      Height:       General: alert, cooperative and no distress Lochia: appropriate Uterine Fundus: firm Incision: Healing well with no significant drainage DVT Evaluation: No cords or calf tenderness. No significant calf/ankle edema. Labs: Lab Results  Component Value Date   WBC 15.4 (H) 03/29/2019   HGB 8.7 (L) 03/29/2019   HCT  26.8 (L) 03/29/2019   MCV 89.3 03/29/2019   PLT 244 03/29/2019   CMP Latest Ref Rng & Units 02/15/2018  Glucose 65 - 99 mg/dL 71  BUN 6 - 20 mg/dL 8  Creatinine 0.57 - 1.00 mg/dL 0.97  Sodium 134 - 144 mmol/L 139  Potassium 3.5 - 5.2 mmol/L 4.7  Chloride 96 - 106 mmol/L 102  CO2 20 - 29 mmol/L 21  Calcium 8.7 - 10.2 mg/dL 9.7  Total Protein 6.0 - 8.5 g/dL 7.1  Total Bilirubin 0.0 - 1.2 mg/dL 0.5  Alkaline Phos 39 - 117 IU/L 68  AST 0 - 40 IU/L 15  ALT 0 - 32 IU/L 13   CBC Latest Ref Rng & Units 03/29/2019 03/28/2019 09/14/2017  WBC 4.0 - 10.5 K/uL 15.4(H) 12.9(H) 12.8(H)  Hemoglobin 12.0 - 15.0 g/dL 8.7(L) 10.7(L) 13.5  Hematocrit 36.0 - 46.0 % 26.8(L) 32.1(L) 41.7  Platelets 150 - 400 K/uL 244 277 349    Vaccines: TDaP UTD         Flu    declined  Discharge instruction: per After Visit Summary and "Baby and Me Booklet".  After Visit Meds:  Allergies as of 03/30/2019      Reactions   Amlodipine Other (See Comments)   Saxenda [liraglutide -weight Management]  nausea   Topamax [topiramate] Other (See Comments)   Confusion       Medication List    STOP taking these medications   valACYclovir 1000 MG tablet Commonly known as:  VALTREX     TAKE these medications   acetaminophen 500 MG tablet Commonly known as:  TYLENOL Take 2 tablets (1,000 mg total) by mouth every 6 (six) hours.   cetirizine 10 MG tablet Commonly known as:  ZYRTEC Take 10 mg by mouth at bedtime.   coconut oil Oil Apply 1 application topically as needed.   docusate sodium 100 MG capsule Commonly known as:  COLACE Take 200 mg by mouth at bedtime.   Gas-X Extra Strength 125 MG chewable tablet Generic drug:  simethicone Chew 125 mg by mouth every 6 (six) hours as needed for flatulence.   ibuprofen 800 MG tablet Commonly known as:  ADVIL Take 1 tablet (800 mg total) by mouth every 8 (eight) hours.   iron polysaccharides 150 MG capsule Commonly known as:  NIFEREX Take 1 capsule (150 mg  total) by mouth daily.   labetalol 100 MG tablet Commonly known as:  NORMODYNE Take 1 tablet (100 mg total) by mouth 2 (two) times daily. What changed:  when to take this   levothyroxine 125 MCG tablet Commonly known as:  SYNTHROID Take 125 mcg by mouth daily before breakfast.   Magnesium Oxide 400 (240 Mg) MG Tabs Take 1 tablet (400 mg total) by mouth daily. For prevention of constipation.   oxyCODONE 5 MG immediate release tablet Commonly known as:  Oxy IR/ROXICODONE Take 1-2 tablets (5-10 mg total) by mouth every 4 (four) hours as needed for moderate pain.   pantoprazole 40 MG tablet Commonly known as:  PROTONIX Take 40 mg by mouth at bedtime.   prenatal multivitamin Tabs tablet Take 1 tablet by mouth daily at 12 noon.       Diet: routine diet  Activity: Advance as tolerated. Pelvic rest for 6 weeks.   Postpartum contraception: Not Discussed  Newborn Data: Live born female  Birth Weight: 7 lb 3.5 oz (3275 g) APGAR: 3, 9  Newborn Delivery   Birth date/time:  03/28/2019 08:06:00 Delivery type:  C-Section, Low Transverse Trial of labor:  No C-section categorization:  Repeat     named Lucy Baby Feeding: Breast Disposition:home with mother   Delivery Report:  Review the Delivery Report for details.    Follow up: Follow-up Information    Servando Salina, MD. Go in 6 week(s).   Specialty:  Obstetrics and Gynecology Contact information: [address] [place] [address] [phone]             Signed: Juliene Pina, CNM, MSN 03/30/2019, 12:09 PM

## 2019-03-30 NOTE — Lactation Note (Signed)
This note was copied from a baby's chart. Lactation Consultation Note  Patient Name: Jacqueline Peterson [id number] Date: 03/30/2019 Reason for consult: Follow-up assessment;1st time breastfeeding;Term;Maternal endocrine disorder;Nipple pain/trauma Type of Endocrine Disorder?: Thyroid  Checked in with P2 Mom of term baby at 50 hrs old.  Baby at 7% weight loss with exclusive breastfeeding.  Mom made the decision to switch to formula feeding due to increased soreness with latch.  Both nipples abraded on tips.  Coconut oil given and applied light coating using cotton tipped applicator.    DEBP set up at bedside.  Mom has not pumped yet.  Mom given information on pumping and breast milk feeding baby.  Mom is not interested in that currently, but states she may change her mind once she is at home.  Mom has a DEBP at home.  Encouraged her to take all her pump parts home.  Engorgement for nursing and non-nursing Moms reviewed.  Mom aware of OP lactation support available to her, and encouraged her to call prn.  Interventions Interventions: Breast feeding basics reviewed;Skin to skin;Breast massage;Hand express;DEBP;Expressed milk;Coconut oil  Lactation Tools Discussed/Used Tools: Pump;Coconut oil;Bottle;Nipple Shields Breast pump type: Double-Electric Breast Pump   Consult Status Consult Status: Complete Date: 03/30/19 Follow-up type: Call as needed    Broadus John 03/30/2019, 11:05 AM

## 2019-04-04 ENCOUNTER — Inpatient Hospital Stay (HOSPITAL_COMMUNITY): Admission: RE | Admit: 2019-04-04 | Payer: 59 | Source: Home / Self Care

## 2019-04-16 ENCOUNTER — Encounter: Payer: Self-pay | Admitting: Physician Assistant

## 2019-04-17 ENCOUNTER — Other Ambulatory Visit: Payer: Self-pay | Admitting: Physician Assistant

## 2019-04-17 MED ORDER — BUPROPION HCL ER (XL) 150 MG PO TB24: 150.0000 mg | ORAL_TABLET | Freq: Every day | ORAL | 2 refills | Status: DC

## 2019-04-17 MED FILL — buPROPion HCL ER (XL) 150 M: 150 | 30 days supply | Qty: 60 | Fill #0

## 2019-04-25 MED FILL — PANTOPRAZOLE SOD DR 40 MG T: 40 | 30 days supply | Qty: 30 | Fill #2

## 2019-05-16 ENCOUNTER — Other Ambulatory Visit: Payer: Self-pay

## 2019-05-17 ENCOUNTER — Encounter: Payer: Self-pay | Admitting: Physician Assistant

## 2019-05-17 ENCOUNTER — Ambulatory Visit (INDEPENDENT_AMBULATORY_CARE_PROVIDER_SITE_OTHER): Payer: 59 | Admitting: Physician Assistant

## 2019-05-17 VITALS — BP 114/79 | HR 68 | Temp 98.5°F | Ht 63.0 in | Wt 273.2 lb

## 2019-05-17 DIAGNOSIS — I10 Essential (primary) hypertension: Secondary | ICD-10-CM | POA: Diagnosis not present

## 2019-05-17 DIAGNOSIS — Z0000 Encounter for general adult medical examination without abnormal findings: Secondary | ICD-10-CM

## 2019-05-17 DIAGNOSIS — Z0001 Encounter for general adult medical examination with abnormal findings: Secondary | ICD-10-CM | POA: Diagnosis not present

## 2019-05-17 DIAGNOSIS — E039 Hypothyroidism, unspecified: Secondary | ICD-10-CM | POA: Diagnosis not present

## 2019-05-17 MED ORDER — BUPROPION HCL ER (XL) 300 MG PO TB24: 300.0000 mg | ORAL_TABLET | Freq: Every day | ORAL | 3 refills | Status: DC

## 2019-05-17 MED ORDER — PANTOPRAZOLE SODIUM 40 MG PO TBEC: 40.0000 mg | DELAYED_RELEASE_TABLET | Freq: Every day | ORAL | 3 refills | Status: DC

## 2019-05-17 MED ORDER — VALACYCLOVIR HCL 1 G PO TABS: 1000.0000 mg | ORAL_TABLET | Freq: Every day | ORAL | 3 refills | Status: DC

## 2019-05-17 MED ORDER — LABETALOL HCL 100 MG PO TABS: 100.0000 mg | ORAL_TABLET | Freq: Two times a day (BID) | ORAL | 3 refills | Status: DC

## 2019-05-17 MED ORDER — PHENTERMINE HCL 37.5 MG PO TBDP: 37.5000 mg | ORAL_TABLET | ORAL | 1 refills | Status: DC

## 2019-05-17 NOTE — Progress Notes (Signed)
BP 114/79   Pulse 68   Temp 98.5 F (36.9 C) (Oral)   Ht _0  (1.6 m)   Wt 273 lb 3.2 oz (123.9 kg)   LMP 06/20/2018   BMI 48.40 kg/m    Subjective:    Patient ID: Jacqueline Peterson, female    DOB: Mar 13, 1986, 33 y.o.   MRN: [id number]  HPI: Jacqueline Peterson is a 33 y.o. female presenting on 05/17/2019 for Annual Exam  This patient comes in for annual well physical examination. All medications are reviewed today. There are no reports of any problems with the medications. All of the medical conditions are reviewed and updated.  Lab work is reviewed and will be ordered as medically necessary.   When she had her 6-week OB check, her thyroid was slightly off.  They adjusted her medication to levothyroxine 112 mcg.  We will plan to recheck this when she comes back in 2 months.  She is also trying to work on weight loss.  She is almost back down to her prepregnancy weight.  She states overall she is doing very well.  She has taken phentermine in the past and would like to try it for the next 2 months to see if it can help her with her appetite and her diet efforts.   Past Medical History:  Diagnosis Date  . Complication of anesthesia    felt part of surgery with epidural  . GERD (gastroesophageal reflux disease)   . History of chicken pox   . HSV infection   . Hypertension   . Hypothyroidism    Relevant past medical, surgical, family and social history reviewed and updated as indicated. Interim medical history since our last visit reviewed. Allergies and medications reviewed and updated. DATA REVIEWED: CHART IN EPIC  Family History reviewed for pertinent findings.  Review of Systems  Constitutional: Negative.   HENT: Negative.   Eyes: Negative.   Respiratory: Negative.   Gastrointestinal: Negative.   Genitourinary: Negative.     Allergies as of 05/17/2019      Reactions   Amlodipine Other (See Comments)   Saxenda [liraglutide -weight Management]    nausea   Topamax [topiramate] Other (See Comments)   Confusion       Medication List       Accurate as of May 17, 2019  1:35 PM. If you have any questions, ask your nurse or doctor.        STOP taking these medications   acetaminophen 500 MG tablet Commonly known as: TYLENOL Stopped by: Terald Sleeper, PA-C   coconut oil Oil Stopped by: Terald Sleeper, PA-C   docusate sodium 100 MG capsule Commonly known as: COLACE Stopped by: Terald Sleeper, PA-C   ibuprofen 800 MG tablet Commonly known as: ADVIL Stopped by: Terald Sleeper, PA-C   iron polysaccharides 150 MG capsule Commonly known as: NIFEREX Stopped by: Terald Sleeper, PA-C   Magnesium Oxide 400 (240 Mg) MG Tabs Stopped by: Terald Sleeper, PA-C   oxyCODONE 5 MG immediate release tablet Commonly known as: Oxy IR/ROXICODONE Stopped by: Terald Sleeper, PA-C   prenatal multivitamin Tabs tablet Stopped by: Terald Sleeper, PA-C     TAKE these medications   buPROPion 300 MG 24 hr tablet Commonly known as: Wellbutrin XL Take 1 tablet (300 mg total) by mouth daily. What changed:   medication strength  how much to take Changed by: Terald Sleeper, PA-C   cetirizine 10 MG  tablet Commonly known as: ZYRTEC Take 10 mg by mouth at bedtime.   Gas-X Extra Strength 125 MG chewable tablet Generic drug: simethicone Chew 125 mg by mouth every 6 (six) hours as needed for flatulence.   labetalol 100 MG tablet Commonly known as: NORMODYNE Take 1 tablet (100 mg total) by mouth 2 (two) times daily. What changed: when to take this   levothyroxine 112 MCG tablet Commonly known as: SYNTHROID Take 112 mcg by mouth daily before breakfast. What changed: Another medication with the same name was removed. Continue taking this medication, and follow the directions you see here. Changed by: Terald Sleeper, PA-C   pantoprazole 40 MG tablet Commonly known as: PROTONIX Take 1 tablet (40 mg total) by mouth at bedtime.   Phentermine HCl 37.5 MG Tbdp  Take 37.5 mg by mouth every morning. Started by: Terald Sleeper, PA-C   valACYclovir 1000 MG tablet Commonly known as: VALTREX Take 1 tablet (1,000 mg total) by mouth daily.          Objective:    BP 114/79   Pulse 68   Temp 98.5 F (36.9 C) (Oral)   Ht _0  (1.6 m)   Wt 273 lb 3.2 oz (123.9 kg)   LMP 06/20/2018   BMI 48.40 kg/m   Allergies  Allergen Reactions  . Amlodipine Other (See Comments)  . Saxenda [Liraglutide -Weight Management]     nausea  . Topamax [Topiramate] Other (See Comments)    Confusion      Wt Readings from Last 3 Encounters:  05/17/19 273 lb 3.2 oz (123.9 kg)  03/28/19 289 lb 14.4 oz (131.5 kg)  03/16/19 290 lb (131.5 kg)    Physical Exam Constitutional:      Appearance: She is well-developed.  HENT:     Head: Normocephalic and atraumatic.  Eyes:     Conjunctiva/sclera: Conjunctivae normal.     Pupils: Pupils are equal, round, and reactive to light.  Cardiovascular:     Rate and Rhythm: Normal rate and regular rhythm.     Heart sounds: Normal heart sounds.  Pulmonary:     Effort: Pulmonary effort is normal.     Breath sounds: Normal breath sounds.  Skin:    General: Skin is warm and dry.     Findings: No rash.  Neurological:     Mental Status: She is alert and oriented to person, place, and time.     Deep Tendon Reflexes: Reflexes are normal and symmetric.  Psychiatric:        Behavior: Behavior normal.        Thought Content: Thought content normal.        Judgment: Judgment normal.         Assessment & Plan:   1. Well adult exam - CBC with Differential/Platelet - CMP14+EGFR - Lipid panel  2. Essential hypertension - labetalol (NORMODYNE) 100 MG tablet; Take 1 tablet (100 mg total) by mouth 2 (two) times daily.  Dispense: 180 tablet; Refill: 3 - TSH; Future  3. Hypothyroidism, unspecified type - levothyroxine (SYNTHROID) 112 MCG tablet; Take 112 mcg by mouth daily before breakfast.   Continue all other maintenance  medications as listed above.  Follow up plan: Return in about 2 months (around 07/18/2019).  Educational handout given for [place] PA-C [place] [address]  [place], [place] [address] [phone]   05/17/2019, 1:35 PM

## 2019-05-18 ENCOUNTER — Other Ambulatory Visit: Payer: Self-pay | Admitting: Physician Assistant

## 2019-05-18 ENCOUNTER — Telehealth: Payer: Self-pay | Admitting: Physician Assistant

## 2019-05-18 LAB — CMP14+EGFR
ALT: 15 IU/L (ref 0–32)
AST: 12 IU/L (ref 0–40)
Albumin/Globulin Ratio: 2 (ref 1.2–2.2)
Albumin: 4.1 g/dL (ref 3.8–4.8)
Alkaline Phosphatase: 97 IU/L (ref 39–117)
BUN/Creatinine Ratio: 12 (ref 9–23)
BUN: 11 mg/dL (ref 6–20)
Bilirubin Total: 0.5 mg/dL (ref 0.0–1.2)
CO2: 24 mmol/L (ref 20–29)
Calcium: 9.3 mg/dL (ref 8.7–10.2)
Chloride: 102 mmol/L (ref 96–106)
Creatinine, Ser: 0.9 mg/dL (ref 0.57–1.00)
GFR calc Af Amer: 96 mL/min/{1.73_m2} (ref >59)
GFR calc non Af Amer: 84 mL/min/{1.73_m2} (ref >59)
Globulin, Total: 2.1 g/dL (ref 1.5–4.5)
Glucose: 72 mg/dL (ref 65–99)
Potassium: 4.8 mmol/L (ref 3.5–5.2)
Sodium: 141 mmol/L (ref 134–144)
Total Protein: 6.2 g/dL (ref 6.0–8.5)

## 2019-05-18 LAB — CBC WITH DIFFERENTIAL/PLATELET
Basophils Absolute: 0.1 10*3/uL (ref 0.0–0.2)
Basos: 1 %
EOS (ABSOLUTE): 0.1 10*3/uL (ref 0.0–0.4)
Eos: 1 %
Hematocrit: 37.2 % (ref 34.0–46.6)
Hemoglobin: 11.9 g/dL (ref 11.1–15.9)
Immature Grans (Abs): 0 10*3/uL (ref 0.0–0.1)
Immature Granulocytes: 1 %
Lymphocytes Absolute: 3 10*3/uL (ref 0.7–3.1)
Lymphs: 37 %
MCH: 27.6 pg (ref 26.6–33.0)
MCHC: 32 g/dL (ref 31.5–35.7)
MCV: 86 fL (ref 79–97)
Monocytes Absolute: 0.6 10*3/uL (ref 0.1–0.9)
Monocytes: 7 %
Neutrophils Absolute: 4.4 10*3/uL (ref 1.4–7.0)
Neutrophils: 53 %
Platelets: 331 10*3/uL (ref 150–450)
RBC: 4.31 x10E6/uL (ref 3.77–5.28)
RDW: 14 % (ref 11.7–15.4)
WBC: 8.2 10*3/uL (ref 3.4–10.8)

## 2019-05-18 LAB — LIPID PANEL
Chol/HDL Ratio: 5.1 ratio — ABNORMAL HIGH (ref 0.0–4.4)
Cholesterol, Total: 259 mg/dL — ABNORMAL HIGH (ref 100–199)
HDL: 51 mg/dL (ref >39)
LDL Calculated: 179 mg/dL — ABNORMAL HIGH (ref 0–99)
Triglycerides: 145 mg/dL (ref 0–149)
VLDL Cholesterol Cal: 29 mg/dL (ref 5–40)

## 2019-05-18 MED ORDER — PHENTERMINE HCL 37.5 MG PO TABS: 37.5000 mg | ORAL_TABLET | Freq: Every day | ORAL | 1 refills | Status: DC

## 2019-05-18 MED ORDER — ATORVASTATIN CALCIUM 20 MG PO TABS: 20.0000 mg | ORAL_TABLET | Freq: Every day | ORAL | 3 refills | Status: DC

## 2019-05-18 NOTE — Progress Notes (Signed)
phentermine

## 2019-05-19 MED FILL — buPROPion HCL ER (XL) 150 M: 150 | 30 days supply | Qty: 60 | Fill #1

## 2019-05-30 ENCOUNTER — Encounter: Payer: Self-pay | Admitting: Physician Assistant

## 2019-06-01 ENCOUNTER — Other Ambulatory Visit: Payer: Self-pay | Admitting: Physician Assistant

## 2019-06-01 MED ORDER — ESCITALOPRAM OXALATE 10 MG PO TABS: 10.0000 mg | ORAL_TABLET | Freq: Every day | ORAL | 5 refills | Status: DC

## 2019-06-01 NOTE — Progress Notes (Signed)
citalopr

## 2019-06-16 ENCOUNTER — Ambulatory Visit: Payer: 59 | Admitting: Physician Assistant

## 2019-06-29 ENCOUNTER — Other Ambulatory Visit: Payer: Self-pay

## 2019-07-03 ENCOUNTER — Ambulatory Visit: Payer: 59 | Admitting: Physician Assistant

## 2019-07-03 ENCOUNTER — Encounter: Payer: Self-pay | Admitting: Physician Assistant

## 2019-07-03 ENCOUNTER — Other Ambulatory Visit: Payer: Self-pay

## 2019-07-03 VITALS — BP 124/89 | HR 75 | Temp 97.6°F | Ht 63.0 in | Wt 268.0 lb

## 2019-07-03 DIAGNOSIS — Z6841 Body Mass Index (BMI) 40.0 and over, adult: Secondary | ICD-10-CM

## 2019-07-03 DIAGNOSIS — E039 Hypothyroidism, unspecified: Secondary | ICD-10-CM | POA: Diagnosis not present

## 2019-07-03 DIAGNOSIS — F339 Major depressive disorder, recurrent, unspecified: Secondary | ICD-10-CM

## 2019-07-03 MED ORDER — VORTIOXETINE HBR 10 MG PO TABS: 10.0000 mg | ORAL_TABLET | Freq: Every day | ORAL | 1 refills | Status: DC

## 2019-07-03 MED ORDER — PHENTERMINE HCL 37.5 MG PO TABS: 37.5000 mg | ORAL_TABLET | Freq: Every day | ORAL | 1 refills | Status: DC

## 2019-07-04 ENCOUNTER — Encounter: Payer: Self-pay | Admitting: Physician Assistant

## 2019-07-04 ENCOUNTER — Other Ambulatory Visit: Payer: Self-pay | Admitting: Physician Assistant

## 2019-07-04 DIAGNOSIS — E039 Hypothyroidism, unspecified: Secondary | ICD-10-CM

## 2019-07-04 LAB — THYROID PANEL WITH TSH
Free Thyroxine Index: 3.7 (ref 1.2–4.9)
T3 Uptake Ratio: 33 % (ref 24–39)
T4, Total: 11.1 ug/dL (ref 4.5–12.0)
TSH: 0.031 u[IU]/mL — ABNORMAL LOW (ref 0.450–4.500)

## 2019-07-04 MED ORDER — LEVOTHYROXINE SODIUM 100 MCG PO TABS: 100.0000 ug | ORAL_TABLET | Freq: Every day | ORAL | 1 refills | Status: DC

## 2019-07-04 NOTE — Progress Notes (Signed)
BP 124/89   Pulse 75   Temp 97.6 F (36.4 C)   Ht 5\' 3"  (1.6 m)   Wt 268 lb (121.6 kg)   BMI 47.47 kg/m    Subjective:    Patient ID: Jacqueline Peterson, female    DOB: [birth year], 33 y.o.   MRN: [id number]  HPI: Jacqueline Peterson is a 33 y.o. female presenting on 07/03/2019 for Hypothyroidism and Depression  This patient comes in for follow-up on her chronic conditions.  She does have hypothyroidism and depression.  She stopped the Lexapro because it was making her have significant sweating, nervousness and decreased libido.  She felt that it was helping some with the depression but the side effects were too much.  Working to see if she can get in Trintellix which is a more specific serotonin medication.  It will be sent to the pharmacy and will plan to see her back in about a month and she will let us know if it does not go through.  Were going to do labs today to see how her thyroid is doing also.  She has been using phentermine as an appetite suppressant and uses it most days.  She is trying hard to work and lose weight she had come down good 10 pounds.  Past Medical History:  Diagnosis Date  . Complication of anesthesia    felt part of surgery with epidural  . GERD (gastroesophageal reflux disease)   . History of chicken pox   . HSV infection   . Hypertension   . Hypothyroidism    Relevant past medical, surgical, family and social history reviewed and updated as indicated. Interim medical history since our last visit reviewed. Allergies and medications reviewed and updated. DATA REVIEWED: CHART IN EPIC  Family History reviewed for pertinent findings.  Review of Systems  Constitutional: Positive for fatigue.  HENT: Negative.   Eyes: Negative.   Respiratory: Negative.   Gastrointestinal: Negative.   Genitourinary: Negative.   Psychiatric/Behavioral: Positive for dysphoric mood.    Allergies as of 07/03/2019      Reactions   Amlodipine Other (See Comments)   Saxenda [liraglutide -weight Management]    nausea   Topamax [topiramate] Other (See Comments)   Confusion       Medication List       Accurate as of July 03, 2019 11:59 PM. If you have any questions, ask your nurse or doctor.        STOP taking these medications   escitalopram 10 MG tablet Commonly known as: Lexapro Stopped by: Terald Sleeper, PA-C     TAKE these medications   atorvastatin 20 MG tablet Commonly known as: LIPITOR Take 1 tablet (20 mg total) by mouth daily.   cetirizine 10 MG tablet Commonly known as: ZYRTEC Take 10 mg by mouth at bedtime.   Gas-X Extra Strength 125 MG chewable tablet Generic drug: simethicone Chew 125 mg by mouth every 6 (six) hours as needed for flatulence.   labetalol 100 MG tablet Commonly known as: NORMODYNE Take 1 tablet (100 mg total) by mouth 2 (two) times daily.   levothyroxine 112 MCG tablet Commonly known as: SYNTHROID Take 112 mcg by mouth daily before breakfast.   pantoprazole 40 MG tablet Commonly known as: PROTONIX Take 1 tablet (40 mg total) by mouth at bedtime.   phentermine 37.5 MG tablet Commonly known as: ADIPEX-P Take 1 tablet (37.5 mg total) by mouth daily before breakfast.   valACYclovir 1000 MG tablet Commonly  known as: VALTREX Take 1 tablet (1,000 mg total) by mouth daily.   vortioxetine HBr 10 MG Tabs tablet Commonly known as: Trintellix Take 1 tablet (10 mg total) by mouth daily. Started by: Terald Sleeper, PA-C          Objective:    BP 124/89   Pulse 75   Temp 97.6 F (36.4 C)   Ht 5\' 3"  (1.6 m)   Wt 268 lb (121.6 kg)   BMI 47.47 kg/m   Allergies  Allergen Reactions  . Amlodipine Other (See Comments)  . Saxenda [Liraglutide -Weight Management]     nausea  . Topamax [Topiramate] Other (See Comments)    Confusion      Wt Readings from Last 3 Encounters:  07/03/19 268 lb (121.6 kg)  05/17/19 273 lb 3.2 oz (123.9 kg)  03/28/19 289 lb 14.4 oz (131.5 kg)    Physical Exam  Constitutional:      General: She is not in acute distress.    Appearance: Normal appearance. She is well-developed.  HENT:     Head: Normocephalic and atraumatic.  Cardiovascular:     Rate and Rhythm: Normal rate.  Pulmonary:     Effort: Pulmonary effort is normal.  Skin:    General: Skin is warm and dry.     Findings: No rash.  Neurological:     Mental Status: She is alert and oriented to person, place, and time.     Deep Tendon Reflexes: Reflexes are normal and symmetric.     Results for orders placed or performed in visit on 07/03/19  Thyroid Panel With TSH  Result Value Ref Range   TSH 0.031 (L) 0.450 - 4.500 uIU/mL   T4, Total 11.1 4.5 - 12.0 ug/dL   T3 Uptake Ratio 33 24 - 39 %   Free Thyroxine Index 3.7 1.2 - 4.9      Assessment & Plan:   1. Hypothyroidism, unspecified type - Thyroid Panel With TSH  2. Acquired hypothyroidism - Thyroid Panel With TSH  3. BMI 45.0-49.9, adult (HCC) - phentermine (ADIPEX-P) 37.5 MG tablet; Take 1 tablet (37.5 mg total) by mouth daily before breakfast.  Dispense: 30 tablet; Refill: 1  4. Depression, recurrent ([place]) - vortioxetine HBr (TRINTELLIX) 10 MG TABS tablet; Take 1 tablet (10 mg total) by mouth daily.  Dispense: 30 tablet; Refill: 1   Continue all other maintenance medications as listed above.  Follow up plan: Return in about 4 weeks (around 07/31/2019).  Educational handout given for Ray[place] PA-C [place] [facility] [address]  [place], [place] [address] [phone]   07/04/2019, 10:09 PM

## 2019-07-07 ENCOUNTER — Ambulatory Visit: Payer: 59 | Admitting: Physician Assistant

## 2019-08-03 ENCOUNTER — Telehealth: Payer: Self-pay | Admitting: *Deleted

## 2019-08-03 ENCOUNTER — Encounter: Payer: Self-pay | Admitting: Physician Assistant

## 2019-08-03 NOTE — Telephone Encounter (Signed)
Prior auth for Trintellix 10 mg- In Process   Key: [id number]

## 2019-08-04 ENCOUNTER — Ambulatory Visit: Payer: 59 | Admitting: Physician Assistant

## 2019-08-04 NOTE — Telephone Encounter (Signed)
Prior Auth for Trintellix 10mg - APPROVED through 08/02/20   PA# [id number]  Pharmacy notified.

## 2019-08-14 ENCOUNTER — Encounter: Payer: Self-pay | Admitting: Physician Assistant

## 2019-10-20 ENCOUNTER — Encounter: Payer: Self-pay | Admitting: Physician Assistant

## 2019-10-25 ENCOUNTER — Encounter: Payer: Self-pay | Admitting: Physician Assistant

## 2019-10-25 ENCOUNTER — Ambulatory Visit (INDEPENDENT_AMBULATORY_CARE_PROVIDER_SITE_OTHER): Payer: 59 | Admitting: Physician Assistant

## 2019-10-25 DIAGNOSIS — I10 Essential (primary) hypertension: Secondary | ICD-10-CM

## 2019-10-25 DIAGNOSIS — E039 Hypothyroidism, unspecified: Secondary | ICD-10-CM | POA: Diagnosis not present

## 2019-10-25 DIAGNOSIS — Z6841 Body Mass Index (BMI) 40.0 and over, adult: Secondary | ICD-10-CM

## 2019-10-25 NOTE — Progress Notes (Signed)
Telephone visit  Subjective: [id number] gain, obesity PCP: Terald Sleeper, PA-C [phone] Jacqueline Peterson is a 33 y.o. female calls for telephone consult today. Patient provides verbal consent for consult held via phone.  Patient is identified with 2 separate identifiers.  At this time the entire area is on COVID-19 social distancing and stay home orders are in place.  Patient is of higher risk and therefore we are performing this by a virtual method.  Location of patient: home Location of provider: HOME Others present for call: no   This patient is having a follow-up for her weight gain and obesity.  Her last BMI was 47.49.  She reports that she has diet and all of her life that she can remember.  She says even in middle school she was on a diet with her parents.  Primarily calorie restriction has been the way in which she was somewhat successful.  She had a very hard time trying to do keto or very low protein just because the amount of planning and work that went into it.  At this point she does work full-time and has 2 small children.  She has tried to increase protein in her diet.  She has tried phentermine in the past.  It did work 1 time.  However even this year she had tried and failed phentermine helping at all.  We are going to have her continue to try to look at the best way for have been calorie restriction and trying to increase activity such as going for daily walks.  ROS: Per HPI  Allergies  Allergen Reactions  . Amlodipine Other (See Comments)  . Saxenda [Liraglutide -Weight Management]     nausea  . Topamax [Topiramate] Other (See Comments)    Confusion     Past Medical History:  Diagnosis Date  . Complication of anesthesia    felt part of surgery with epidural  . GERD (gastroesophageal reflux disease)   . History of chicken pox   . HSV infection   . Hypertension   . Hypothyroidism     Current Outpatient Medications:  .  atorvastatin (LIPITOR) 20 MG  tablet, Take 1 tablet (20 mg total) by mouth daily., Disp: 90 tablet, Rfl: 3 .  cetirizine (ZYRTEC) 10 MG tablet, Take 10 mg by mouth at bedtime., Disp: , Rfl:  .  labetalol (NORMODYNE) 100 MG tablet, Take 1 tablet (100 mg total) by mouth 2 (two) times daily., Disp: 180 tablet, Rfl: 3 .  levothyroxine (SYNTHROID) 100 MCG tablet, Take 1 tablet (100 mcg total) by mouth daily before breakfast., Disp: 90 tablet, Rfl: 1 .  pantoprazole (PROTONIX) 40 MG tablet, Take 1 tablet (40 mg total) by mouth at bedtime., Disp: 90 tablet, Rfl: 3 .  phentermine (ADIPEX-P) 37.5 MG tablet, Take 1 tablet (37.5 mg total) by mouth daily before breakfast., Disp: 30 tablet, Rfl: 1 .  simethicone (GAS-X EXTRA STRENGTH) 125 MG chewable tablet, Chew 125 mg by mouth every 6 (six) hours as needed for flatulence., Disp: , Rfl:  .  valACYclovir (VALTREX) 1000 MG tablet, Take 1 tablet (1,000 mg total) by mouth daily., Disp: 90 tablet, Rfl: 3 .  vortioxetine HBr (TRINTELLIX) 10 MG TABS tablet, Take 1 tablet (10 mg total) by mouth daily., Disp: 30 tablet, Rfl: 1  Assessment/ Plan: 33 y.o. female   1. Chronic hypertension Labetolol 100 mg twice daily   2. Hypothyroidism, unspecified type Levothyroxine 100 mcg 1 daily  3. Morbid obesity ([place])  Calorie restriction Increase activity with daily walks  4. BMI 45.0-49.9, adult [facility]) Consider future referral to obesity specialist/bariatric surgery   No follow-ups on file.  Continue all other maintenance medications as listed above.  Start time: 8:17 AM End time: 8:27 AM  No orders of the defined types were placed in this encounter.   Particia Nearing PA-C [place] [phone]

## 2019-12-12 ENCOUNTER — Ambulatory Visit (INDEPENDENT_AMBULATORY_CARE_PROVIDER_SITE_OTHER): Payer: 59 | Admitting: Physician Assistant

## 2019-12-12 ENCOUNTER — Other Ambulatory Visit: Payer: Self-pay

## 2019-12-12 ENCOUNTER — Encounter: Payer: Self-pay | Admitting: Physician Assistant

## 2019-12-12 VITALS — BP 123/78 | HR 76 | Temp 97.7°F | Ht 63.0 in | Wt 279.0 lb

## 2019-12-12 DIAGNOSIS — M722 Plantar fascial fibromatosis: Secondary | ICD-10-CM | POA: Diagnosis not present

## 2019-12-12 MED ORDER — PREDNISONE 10 MG PO TABS: 10.0000 mg | ORAL_TABLET | Freq: Every day | ORAL | 0 refills | Status: DC

## 2019-12-12 NOTE — Progress Notes (Signed)
Acute Office Visit  Subjective:    Patient ID: Jacqueline Peterson, female    DOB: 27-Mar-1986, 34 y.o.   MRN: [id number]  Chief Complaint  Patient presents with  . Foot Pain    left     Foot Injury  The incident occurred more than 1 week ago. There was no injury mechanism. The pain is present in the right heel and right foot. The quality of the pain is described as aching. The pain is at a severity of 6/10. The pain has been intermittent since onset. Associated symptoms include a loss of motion. Pertinent negatives include no inability to bear weight. The symptoms are aggravated by weight bearing and movement. She has tried elevation, ice, immobilization, acetaminophen, heat, NSAIDs and rest for the symptoms. The treatment provided mild relief.    Past Medical History:  Diagnosis Date  . Complication of anesthesia    felt part of surgery with epidural  . GERD (gastroesophageal reflux disease)   . History of chicken pox   . HSV infection   . Hypertension   . Hypothyroidism     Past Surgical History:  Procedure Laterality Date  . CESAREAN SECTION N/A 07/16/2016   Procedure: CESAREAN SECTION;  Surgeon: Chancy Milroy, MD;  Location: [facility];  Service: Obstetrics;  Laterality: N/A;  . CESAREAN SECTION N/A 03/28/2019   Procedure: Repeat CESAREAN SECTION;  Surgeon: Servando Salina, MD;  Location: [facility];  Service: Obstetrics;  Laterality: N/A;  EDD: 04/04/19  . NO PAST SURGERIES      Family History  Problem Relation Age of Onset  . Fibroids Mother   . Thyroid disease Mother        Hypo  . Basal cell carcinoma Mother   . Hypertension Mother   . Cancer Father 65       Non Hodgkins Adreanal carcinoma, Non small cell  . Hypertension Father   . Early death Father   . ADD / ADHD Sister   . Kidney disease Maternal Uncle   . Hypertension Maternal Uncle   . Anxiety disorder Maternal Uncle   . Diabetes Maternal Grandmother        Type 1  . Pancreatitis  Maternal Grandmother   . Aneurysm Maternal Grandmother        brain  . Hypertension Maternal Grandfather   . Rheumatic fever Maternal Grandfather   . Heart disease Maternal Grandfather   . Hypertension Paternal Grandmother   . Stroke Paternal Grandmother   . Heart disease Paternal Grandmother   . Lymphoma Paternal Grandfather   . Hypertension Paternal Grandfather   . Diabetes Paternal Grandfather   . Thyroid disease Paternal Grandfather   . Heart disease Paternal Grandfather   . Cancer Other        60's Breast Ca  . Rheum arthritis Other   . Breast cancer Maternal Aunt     Social History   Socioeconomic History  . Marital status: Married    Spouse name: Not on file  . Number of children: 0  . Years of education: Not on file  . Highest education level: Not on file  Occupational History  . Occupation: [employer]  Tobacco Use  . Smoking status: Former Smoker    Packs/day: 0.50    Years: 6.00    Pack years: 3.00    Quit date: 12/24/2014    Years since quitting: 4.9  . Smokeless tobacco: Never Used  Substance and Sexual Activity  . Alcohol  use: Yes    Alcohol/week: 0.0 standard drinks    Comment: Occasional  . Drug use: No  . Sexual activity: Yes    Birth control/protection: None  Other Topics Concern  . Not on file  Social History Narrative   Lives in [place] with husband. Dog and cat in house. Works at [employer].      Regular Exercise -  NO   Daily Caffeine Use:  1 tea   Social Determinants of Health   Financial Resource Strain: Low Risk   . Difficulty of Paying Living Expenses: Not hard at all  Food Insecurity: No Food Insecurity  . Worried About Charity fundraiser in the Last Year: Never true  . Ran Out of Food in the Last Year: Never true  Transportation Needs: Unknown  . Lack of Transportation (Medical): No  . Lack of Transportation (Non-Medical): Not on file  Physical Activity:   . Days of Exercise per Week: Not on file  .  Minutes of Exercise per Session: Not on file  Stress: No Stress Concern Present  . Feeling of Stress : Only a little  Social Connections:   . Frequency of Communication with Friends and Family: Not on file  . Frequency of Social Gatherings with Friends and Family: Not on file  . Attends Religious Services: Not on file  . Active Member of Clubs or Organizations: Not on file  . Attends Archivist Meetings: Not on file  . Marital Status: Not on file  Intimate Partner Violence: Not At Risk  . Fear of Current or Ex-Partner: No  . Emotionally Abused: No  . Physically Abused: No  . Sexually Abused: No    Outpatient Medications Prior to Visit  Medication Sig Dispense Refill  . cetirizine (ZYRTEC) 10 MG tablet Take 10 mg by mouth at bedtime.    Marland Kitchen labetalol (NORMODYNE) 100 MG tablet Take 1 tablet (100 mg total) by mouth 2 (two) times daily. 180 tablet 3  . levothyroxine (SYNTHROID) 100 MCG tablet Take 1 tablet (100 mcg total) by mouth daily before breakfast. 90 tablet 1  . pantoprazole (PROTONIX) 40 MG tablet Take 1 tablet (40 mg total) by mouth at bedtime. 90 tablet 3  . valACYclovir (VALTREX) 1000 MG tablet Take 1 tablet (1,000 mg total) by mouth daily. 90 tablet 3  . phentermine (ADIPEX-P) 37.5 MG tablet Take 1 tablet (37.5 mg total) by mouth daily before breakfast. (Patient not taking: Reported on 12/12/2019) 30 tablet 1  . atorvastatin (LIPITOR) 20 MG tablet Take 1 tablet (20 mg total) by mouth daily. (Patient not taking: Reported on 12/12/2019) 90 tablet 3  . simethicone (GAS-X EXTRA STRENGTH) 125 MG chewable tablet Chew 125 mg by mouth every 6 (six) hours as needed for flatulence.    . vortioxetine HBr (TRINTELLIX) 10 MG TABS tablet Take 1 tablet (10 mg total) by mouth daily. 30 tablet 1   No facility-administered medications prior to visit.    Allergies  Allergen Reactions  . Amlodipine Other (See Comments)  . Drug Ingredient [Vortioxetine] Itching  . Saxenda [Liraglutide  -Weight Management]     nausea  . Topamax [Topiramate] Other (See Comments)    Confusion      Review of Systems  Constitutional: Negative.   HENT: Negative.   Eyes: Negative.   Respiratory: Negative.   Gastrointestinal: Negative.   Genitourinary: Negative.   Musculoskeletal: Positive for arthralgias and joint swelling.       Objective:    Physical Exam  Constitutional:      General: She is not in acute distress.    Appearance: Normal appearance. She is well-developed.  HENT:     Head: Normocephalic and atraumatic.  Cardiovascular:     Rate and Rhythm: Normal rate.  Pulmonary:     Effort: Pulmonary effort is normal.  Musculoskeletal:       Feet:  Feet:     Right foot:     Skin integrity: Skin integrity normal.     Comments: Pain at heel and calcaneal insertion of the plantar fascie Skin:    General: Skin is warm and dry.     Findings: No rash.  Neurological:     Mental Status: She is alert and oriented to person, place, and time.     Deep Tendon Reflexes: Reflexes are normal and symmetric.     BP 123/78   Pulse 76   Temp 97.7 F (36.5 C) (Temporal)   Ht 5\' 3"  (1.6 m)   Wt 279 lb (126.6 kg)   LMP 12/06/2019   BMI 49.42 kg/m  Wt Readings from Last 3 Encounters:  12/12/19 279 lb (126.6 kg)  07/03/19 268 lb (121.6 kg)  05/17/19 273 lb 3.2 oz (123.9 kg)    Health Maintenance Due  Topic Date Due  . PNEUMOCOCCAL POLYSACCHARIDE VACCINE AGE 75-64 HIGH RISK  11/07/1986  . INFLUENZA VACCINE  06/17/2019    There are no preventive care reminders to display for this patient.   Lab Results  Component Value Date   TSH 0.031 (L) 07/03/2019   Lab Results  Component Value Date   WBC 8.2 05/17/2019   HGB 11.9 05/17/2019   HCT 37.2 05/17/2019   MCV 86 05/17/2019   PLT 331 05/17/2019   Lab Results  Component Value Date   NA 141 05/17/2019   K 4.8 05/17/2019   CO2 24 05/17/2019   GLUCOSE 72 05/17/2019   BUN 11 05/17/2019   CREATININE 0.90 05/17/2019    BILITOT 0.5 05/17/2019   ALKPHOS 97 05/17/2019   AST 12 05/17/2019   ALT 15 05/17/2019   PROT 6.2 05/17/2019   ALBUMIN 4.1 05/17/2019   CALCIUM 9.3 05/17/2019   ANIONGAP 6 07/15/2016   GFR 90.44 06/28/2015   Lab Results  Component Value Date   CHOL 259 (H) 05/17/2019   Lab Results  Component Value Date   HDL 51 05/17/2019   Lab Results  Component Value Date   LDLCALC 179 (H) 05/17/2019   Lab Results  Component Value Date   TRIG 145 05/17/2019   Lab Results  Component Value Date   CHOLHDL 5.1 (H) 05/17/2019   Lab Results  Component Value Date   HGBA1C 5.2 10/19/2017       Assessment & Plan:   Problem List Items Addressed This Visit    None    Visit Diagnoses    Plantar fasciitis    -  Primary   Relevant Orders   Ambulatory referral to Podiatry       Meds ordered this encounter  Medications  . predniSONE (DELTASONE) 10 MG tablet    Sig: Take 1 tablet (10 mg total) by mouth daily with breakfast.    Dispense:  20 tablet    Refill:  0    Order Specific Question:   Supervising Provider    Answer:   Janora Norlander [id number]     Terald Sleeper, PA-C

## 2019-12-12 NOTE — Patient Instructions (Signed)
Plantar Fasciitis Rehab Ask your health care provider which exercises are safe for you. Do exercises exactly as told by your health care provider and adjust them as directed. It is normal to feel mild stretching, pulling, tightness, or discomfort as you do these exercises. Stop right away if you feel sudden pain or your pain gets worse. Do not begin these exercises until told by your health care provider. Stretching and range-of-motion exercises These exercises warm up your muscles and joints and improve the movement and flexibility of your foot. These exercises also help to relieve pain. Plantar fascia stretch  1. Sit with your left / right leg crossed over your opposite knee. 2. Hold your heel with one hand with that thumb near your arch. With your other hand, hold your toes and gently pull them back toward the top of your foot. You should feel a stretch on the bottom of your toes or your foot (plantar fascia) or both. 3. Hold this stretch for__________ seconds. 4. Slowly release your toes and return to the starting position. Repeat __________ times. Complete this exercise __________ times a day. Gastrocnemius stretch, standing This exercise is also called a calf (gastroc) stretch. It stretches the muscles in the back of the upper calf. 1. Stand with your hands against a wall. 2. Extend your left / right leg behind you, and bend your front knee slightly. 3. Keeping your heels on the floor and your back knee straight, shift your weight toward the wall. Do not arch your back. You should feel a gentle stretch in your upper left / right calf. 4. Hold this position for __________ seconds. Repeat __________ times. Complete this exercise __________ times a day. Soleus stretch, standing This exercise is also called a calf (soleus) stretch. It stretches the muscles in the back of the lower calf. 1. Stand with your hands against a wall. 2. Extend your left / right leg behind you, and bend your front  knee slightly. 3. Keeping your heels on the floor, bend your back knee and shift your weight slightly over your back leg. You should feel a gentle stretch deep in your lower calf. 4. Hold this position for __________ seconds. Repeat __________ times. Complete this exercise __________ times a day. Gastroc and soleus stretch, standing step This exercise stretches the muscles in the back of the lower leg. These muscles are in the upper calf (gastrocnemius) and the lower calf (soleus). 1. Stand with the ball of your left / right foot on a step. The ball of your foot is on the walking surface, right under your toes. 2. Keep your other foot firmly on the same step. 3. Hold on to the wall or a railing for balance. 4. Slowly lift your other foot, allowing your body weight to press your left / right heel down over the edge of the step. You should feel a stretch in your left / right calf. 5. Hold this position for __________ seconds. 6. Return both feet to the step. 7. Repeat this exercise with a slight bend in your left / right knee. Repeat __________ times with your left / right knee straight and __________ times with your left / right knee bent. Complete this exercise __________ times a day. Balance exercise This exercise builds your balance and strength control of your arch to help take pressure off your plantar fascia. Single leg stand If this exercise is too easy, you can try it with your eyes closed or while standing on a pillow. 1.   Without shoes, stand near a railing or in a doorway. You may hold on to the railing or door frame as needed. 2. Stand on your left / right foot. Keep your big toe down on the floor and try to keep your arch lifted. Do not let your foot roll inward. 3. Hold this position for __________ seconds. Repeat __________ times. Complete this exercise __________ times a day. This information is not intended to replace advice given to you by your health care provider. Make sure  you discuss any questions you have with your health care provider. Document Revised: 02/23/2019 Document Reviewed: 08/31/2018 Elsevier Patient Education  2020 [employer]  

## 2019-12-13 ENCOUNTER — Encounter: Payer: Self-pay | Admitting: Physician Assistant

## 2019-12-22 ENCOUNTER — Ambulatory Visit: Payer: 59 | Admitting: Podiatry

## 2019-12-22 ENCOUNTER — Other Ambulatory Visit: Payer: Self-pay

## 2019-12-22 ENCOUNTER — Ambulatory Visit (INDEPENDENT_AMBULATORY_CARE_PROVIDER_SITE_OTHER): Payer: 59

## 2019-12-22 ENCOUNTER — Other Ambulatory Visit: Payer: Self-pay | Admitting: Podiatry

## 2019-12-22 VITALS — BP 123/85 | HR 93 | Temp 97.3°F

## 2019-12-22 DIAGNOSIS — M216X9 Other acquired deformities of unspecified foot: Secondary | ICD-10-CM | POA: Diagnosis not present

## 2019-12-22 DIAGNOSIS — M722 Plantar fascial fibromatosis: Secondary | ICD-10-CM

## 2019-12-22 DIAGNOSIS — M79672 Pain in left foot: Secondary | ICD-10-CM | POA: Diagnosis not present

## 2019-12-22 NOTE — Patient Instructions (Signed)

## 2019-12-30 ENCOUNTER — Other Ambulatory Visit: Payer: Self-pay | Admitting: Physician Assistant

## 2019-12-30 DIAGNOSIS — E039 Hypothyroidism, unspecified: Secondary | ICD-10-CM

## 2020-01-05 ENCOUNTER — Ambulatory Visit: Payer: 59 | Admitting: Podiatry

## 2020-01-05 ENCOUNTER — Other Ambulatory Visit: Payer: Self-pay

## 2020-01-05 DIAGNOSIS — M79672 Pain in left foot: Secondary | ICD-10-CM | POA: Diagnosis not present

## 2020-01-05 DIAGNOSIS — M722 Plantar fascial fibromatosis: Secondary | ICD-10-CM | POA: Diagnosis not present

## 2020-01-05 DIAGNOSIS — M216X9 Other acquired deformities of unspecified foot: Secondary | ICD-10-CM | POA: Diagnosis not present

## 2020-01-23 NOTE — Progress Notes (Signed)
  Subjective:  Patient ID: Jacqueline Peterson, female    DOB: 05/03/1986,  MRN: [id number]  Chief Complaint  Patient presents with  . Plantar Fasciitis    Pt states healing well, injections/brace/splint are effective. Pt states she has almost no pain today.    34 y.o. female presents with the above complaint. History confirmed with patient.   Objective:  Physical Exam: warm, good capillary refill, no trophic changes or ulcerative lesions, normal DP and PT pulses and normal sensory exam. Left Foot: no pain with calcaneal squeeze, decreased ankle joint ROM and +Silverskiold test, no pain at medial calc tuber.  Right foot: decreased ankle joint ROM, +Silverskiold test. No pain  Radiographs: X-ray of the left foot: no fracture, dislocation, swelling or degenerative changes noted   Assessment:   1. Plantar fasciitis of left foot   2. Equinus deformity of foot   3. Pain of left heel     Plan:  Patient was evaluated and treated and all questions answered.  Plantar Fasciitis -Improving. Continue stretching and icing. F/u should pain persist.  No follow-ups on file.

## 2020-01-23 NOTE — Progress Notes (Signed)
  Subjective:  Patient ID: Jacqueline Peterson, female    DOB: 05/12/1986,  MRN: [id number]  Chief Complaint  Patient presents with  . Foot Pain    L heel, bottom of heel. x5-6 yrs. Pt stated, "The pain became worse in 03/2019. I began to exercise more. Stretching helps. Before I stretch, my pain is 7/10".    34 y.o. female presents with the above complaint. History confirmed with patient.   Objective:  Physical Exam: warm, good capillary refill, no trophic changes or ulcerative lesions, normal DP and PT pulses and normal sensory exam. Left Foot: tenderness to palpation medial calcaneal tuber, no pain with calcaneal squeeze, decreased ankle joint ROM and +Silverskiold test  Right foot: decreased ankle joint ROM, +Silverskiold test. No pain  Radiographs: X-ray of the left foot: no fracture, dislocation, swelling or degenerative changes noted   Assessment:   1. Plantar fasciitis of left foot   2. Equinus deformity of foot   3. Pain of left heel     Plan:  Patient was evaluated and treated and all questions answered.  Plantar Fasciitis -XR reviewed with patient -Educated patient on stretching and icing of the affected limb -Night splint dispensed -Plantar fascial brace dispensed -Injection delivered to the plantar fascia of the left foot.  Procedure: Injection Tendon/Ligament Consent: Verbal consent obtained. Location: Left plantar fascia at the glabrous junction; medial approach. Skin Prep: Alcohol. Injectate: 1 cc 0.5% marcaine plain, 1 cc dexamethasone phosphate, 0.5 cc kenalog 10. Disposition: Patient tolerated procedure well. Injection site dressed with a band-aid.  Return in about 3 weeks (around 01/12/2020) for Plantar fasciitis, Left.

## 2020-02-05 ENCOUNTER — Other Ambulatory Visit: Payer: Self-pay | Admitting: Physician Assistant

## 2020-02-05 ENCOUNTER — Ambulatory Visit: Payer: 59 | Admitting: Family

## 2020-02-05 DIAGNOSIS — E039 Hypothyroidism, unspecified: Secondary | ICD-10-CM

## 2020-02-05 MED ORDER — LEVOTHYROXINE SODIUM 100 MCG PO TABS: 100.0000 ug | ORAL_TABLET | Freq: Every day | ORAL | 0 refills | Status: DC

## 2020-02-06 ENCOUNTER — Encounter: Payer: Self-pay | Admitting: Physician Assistant

## 2020-02-12 ENCOUNTER — Ambulatory Visit: Payer: 59 | Admitting: Family

## 2020-02-23 ENCOUNTER — Other Ambulatory Visit: Payer: Self-pay

## 2020-02-23 ENCOUNTER — Ambulatory Visit (INDEPENDENT_AMBULATORY_CARE_PROVIDER_SITE_OTHER): Payer: 59 | Admitting: Family

## 2020-02-23 ENCOUNTER — Encounter: Payer: Self-pay | Admitting: Family

## 2020-02-23 VITALS — BP 116/79 | HR 76 | Temp 98.7°F | Ht 63.0 in | Wt 283.4 lb

## 2020-02-23 DIAGNOSIS — I10 Essential (primary) hypertension: Secondary | ICD-10-CM

## 2020-02-23 DIAGNOSIS — K219 Gastro-esophageal reflux disease without esophagitis: Secondary | ICD-10-CM

## 2020-02-23 DIAGNOSIS — E039 Hypothyroidism, unspecified: Secondary | ICD-10-CM

## 2020-02-23 NOTE — Progress Notes (Signed)
 Subjective:    Patient ID: Jacqueline Peterson, female    DOB: 03/17/1986, 33 y.o.   MRN: [id number]  Chief Complaint  Patient presents with  . Medical Management of Chronic Issues    [facility] patient. no concerns    Pt presents to the office today for chronic follow up.  Hypertension This is a chronic problem. The current episode started more than 1 year ago. The problem has been resolved since onset. The problem is controlled. Associated symptoms include malaise/fatigue. Pertinent negatives include no peripheral edema or shortness of breath. Risk factors for coronary artery disease include obesity and sedentary lifestyle. Past treatments include beta blockers. The current treatment provides moderate improvement. There is no history of kidney disease, CAD/MI or heart failure. Identifiable causes of hypertension include a thyroid problem.  Thyroid Problem Presents for follow-up visit. Patient reports no depressed mood, diaphoresis, dry skin or fatigue. The symptoms have been stable. There is no history of heart failure.  Gastroesophageal Reflux She complains of belching and heartburn. This is a chronic problem. The current episode started more than 1 year ago. The problem occurs rarely. Pertinent negatives include no fatigue. Risk factors include obesity. She has tried a PPI for the symptoms.  Herpes Simplex 2 Pt takes Valtrex daily. Has not had outbreak in years. Stable.     Review of Systems  Constitutional: Positive for malaise/fatigue. Negative for diaphoresis and fatigue.  Respiratory: Negative for shortness of breath.   Gastrointestinal: Positive for heartburn.  All other systems reviewed and are negative.      Objective:   Physical Exam Vitals reviewed.  Constitutional:      General: She is not in acute distress.    Appearance: She is well-developed. She is obese.  HENT:     Head: Normocephalic and atraumatic.     Right Ear: Tympanic membrane normal.     Left Ear:  Tympanic membrane normal.  Eyes:     Pupils: Pupils are equal, round, and reactive to light.  Neck:     Thyroid: No thyromegaly.  Cardiovascular:     Rate and Rhythm: Normal rate and regular rhythm.     Heart sounds: Normal heart sounds. No murmur.  Pulmonary:     Effort: Pulmonary effort is normal. No respiratory distress.     Breath sounds: Normal breath sounds. No wheezing.  Abdominal:     General: Bowel sounds are normal. There is no distension.     Palpations: Abdomen is soft.     Tenderness: There is no abdominal tenderness.  Musculoskeletal:        General: No tenderness. Normal range of motion.     Cervical back: Normal range of motion and neck supple.  Skin:    General: Skin is warm and dry.  Neurological:     Mental Status: She is alert and oriented to person, place, and time.     Cranial Nerves: No cranial nerve deficit.     Deep Tendon Reflexes: Reflexes are normal and symmetric.  Psychiatric:        Behavior: Behavior normal.        Thought Content: Thought content normal.        Judgment: Judgment normal.     BP 116/79   Pulse 76   Temp 98.7 F (37.1 C) (Temporal)   Ht 5' 3" (1.6 m)   Wt 283 lb 6.4 oz (128.5 kg)   SpO2 96%   BMI 50.20 kg/m      Assessment &   Plan:  Jacqueline Peterson comes in today with chief complaint of Medical Management of Chronic Issues [facility] Haring patient. no concerns )   Diagnosis and orders addressed:  1. Hypothyroidism, unspecified type - CMP14+EGFR - CBC with Differential/Platelet - TSH  2. Essential hypertension - CMP14+EGFR - CBC with Differential/Platelet  3. Gastroesophageal reflux disease, unspecified whether esophagitis present - CMP14+EGFR - CBC with Differential/Platelet  4. Morbid obesity ([place]) - CMP14+EGFR - CBC with Differential/Platelet  5. Chronic hypertension  - CMP14+EGFR - CBC with Differential/Platelet   Labs pending Health Maintenance reviewed Diet and exercise encouraged  Follow up  plan: 6 months    Evelina Dun, FNP

## 2020-02-23 NOTE — Patient Instructions (Signed)
Health Maintenance, Female Adopting a healthy lifestyle and getting preventive care are important in promoting health and wellness. Ask your health care provider about:  The right schedule for you to have regular tests and exams.  Things you can do on your own to prevent diseases and keep yourself healthy. What should I know about diet, weight, and exercise? Eat a healthy diet   Eat a diet that includes plenty of vegetables, fruits, low-fat dairy products, and lean protein.  Do not eat a lot of foods that are high in solid fats, added sugars, or sodium. Maintain a healthy weight Body mass index (BMI) is used to identify weight problems. It estimates body fat based on height and weight. Your health care provider can help determine your BMI and help you achieve or maintain a healthy weight. Get regular exercise Get regular exercise. This is one of the most important things you can do for your health. Most adults should:  Exercise for at least 150 minutes each week. The exercise should increase your heart rate and make you sweat (moderate-intensity exercise).  Do strengthening exercises at least twice a week. This is in addition to the moderate-intensity exercise.  Spend less time sitting. Even light physical activity can be beneficial. Watch cholesterol and blood lipids Have your blood tested for lipids and cholesterol at 36 years of age, then have this test every 5 years. Have your cholesterol levels checked more often if:  Your lipid or cholesterol levels are high.  You are older than 36 years of age.  You are at high risk for heart disease. What should I know about cancer screening? Depending on your health history and family history, you may need to have cancer screening at various ages. This may include screening for:  Breast cancer.  Cervical cancer.  Colorectal cancer.  Skin cancer.  Lung cancer. What should I know about heart disease, diabetes, and high blood  pressure? Blood pressure and heart disease  High blood pressure causes heart disease and increases the risk of stroke. This is more likely to develop in people who have high blood pressure readings, are of African descent, or are overweight.  Have your blood pressure checked: ? Every 3-5 years if you are 18-39 years of age. ? Every year if you are 40 years old or older. Diabetes Have regular diabetes screenings. This checks your fasting blood sugar level. Have the screening done:  Once every three years after age 40 if you are at a normal weight and have a low risk for diabetes.  More often and at a younger age if you are overweight or have a high risk for diabetes. What should I know about preventing infection? Hepatitis B If you have a higher risk for hepatitis B, you should be screened for this virus. Talk with your health care provider to find out if you are at risk for hepatitis B infection. Hepatitis C Testing is recommended for:  Everyone born from 1945 through 1965.  Anyone with known risk factors for hepatitis C. Sexually transmitted infections (STIs)  Get screened for STIs, including gonorrhea and chlamydia, if: ? You are sexually active and are younger than 36 years of age. ? You are older than 36 years of age and your health care provider tells you that you are at risk for this type of infection. ? Your sexual activity has changed since you were last screened, and you are at increased risk for chlamydia or gonorrhea. Ask your health care provider if   you are at risk.  Ask your health care provider about whether you are at high risk for HIV. Your health care provider may recommend a prescription medicine to help prevent HIV infection. If you choose to take medicine to prevent HIV, you should first get tested for HIV. You should then be tested every 3 months for as long as you are taking the medicine. Pregnancy  If you are about to stop having your period (premenopausal) and  you may become pregnant, seek counseling before you get pregnant.  Take 400 to 800 micrograms (mcg) of folic acid every day if you become pregnant.  Ask for birth control (contraception) if you want to prevent pregnancy. Osteoporosis and menopause Osteoporosis is a disease in which the bones lose minerals and strength with aging. This can result in bone fractures. If you are 65 years old or older, or if you are at risk for osteoporosis and fractures, ask your health care provider if you should:  Be screened for bone loss.  Take a calcium or vitamin D supplement to lower your risk of fractures.  Be given hormone replacement therapy (HRT) to treat symptoms of menopause. Follow these instructions at home: Lifestyle  Do not use any products that contain nicotine or tobacco, such as cigarettes, e-cigarettes, and chewing tobacco. If you need help quitting, ask your health care provider.  Do not use street drugs.  Do not share needles.  Ask your health care provider for help if you need support or information about quitting drugs. Alcohol use  Do not drink alcohol if: ? Your health care provider tells you not to drink. ? You are pregnant, may be pregnant, or are planning to become pregnant.  If you drink alcohol: ? Limit how much you use to 0-1 drink a day. ? Limit intake if you are breastfeeding.  Be aware of how much alcohol is in your drink. In the [place] one drink equals one 12 oz bottle of beer (355 mL), one 5 oz glass of wine (148 mL), or one 1 oz glass of hard liquor (44 mL). General instructions  Schedule regular health, dental, and eye exams.  Stay current with your vaccines.  Tell your health care provider if: ? You often feel depressed. ? You have ever been abused or do not feel safe at home. Summary  Adopting a healthy lifestyle and getting preventive care are important in promoting health and wellness.  Follow your health care provider's instructions about healthy  diet, exercising, and getting tested or screened for diseases.  Follow your health care provider's instructions on monitoring your cholesterol and blood pressure. This information is not intended to replace advice given to you by your health care provider. Make sure you discuss any questions you have with your health care provider. Document Revised: 10/26/2018 Document Reviewed: 10/26/2018 Elsevier Patient Education  2020 [employer]  

## 2020-02-24 LAB — CBC WITH DIFFERENTIAL/PLATELET
Basophils Absolute: 0.1 10*3/uL (ref 0.0–0.2)
Basos: 1 %
EOS (ABSOLUTE): 0.1 10*3/uL (ref 0.0–0.4)
Eos: 2 %
Hematocrit: 41.1 % (ref 34.0–46.6)
Hemoglobin: 13.4 g/dL (ref 11.1–15.9)
Immature Grans (Abs): 0.1 10*3/uL (ref 0.0–0.1)
Immature Granulocytes: 1 %
Lymphocytes Absolute: 2.4 10*3/uL (ref 0.7–3.1)
Lymphs: 30 %
MCH: 28.2 pg (ref 26.6–33.0)
MCHC: 32.6 g/dL (ref 31.5–35.7)
MCV: 87 fL (ref 79–97)
Monocytes Absolute: 0.7 10*3/uL (ref 0.1–0.9)
Monocytes: 9 %
Neutrophils Absolute: 4.8 10*3/uL (ref 1.4–7.0)
Neutrophils: 57 %
Platelets: 307 10*3/uL (ref 150–450)
RBC: 4.75 x10E6/uL (ref 3.77–5.28)
RDW: 14.4 % (ref 11.7–15.4)
WBC: 8.1 10*3/uL (ref 3.4–10.8)

## 2020-02-24 LAB — CMP14+EGFR
ALT: 19 IU/L (ref 0–32)
AST: 20 IU/L (ref 0–40)
Albumin/Globulin Ratio: 1.8 (ref 1.2–2.2)
Albumin: 4.2 g/dL (ref 3.8–4.8)
Alkaline Phosphatase: 81 IU/L (ref 39–117)
BUN/Creatinine Ratio: 13 (ref 9–23)
BUN: 12 mg/dL (ref 6–20)
Bilirubin Total: 0.3 mg/dL (ref 0.0–1.2)
CO2: 21 mmol/L (ref 20–29)
Calcium: 9.4 mg/dL (ref 8.7–10.2)
Chloride: 103 mmol/L (ref 96–106)
Creatinine, Ser: 0.91 mg/dL (ref 0.57–1.00)
GFR calc Af Amer: 95 mL/min/{1.73_m2} (ref >59)
GFR calc non Af Amer: 82 mL/min/{1.73_m2} (ref >59)
Globulin, Total: 2.4 g/dL (ref 1.5–4.5)
Glucose: 85 mg/dL (ref 65–99)
Potassium: 4.7 mmol/L (ref 3.5–5.2)
Sodium: 139 mmol/L (ref 134–144)
Total Protein: 6.6 g/dL (ref 6.0–8.5)

## 2020-02-24 LAB — TSH: TSH: 2.67 u[IU]/mL (ref 0.450–4.500)

## 2020-03-04 ENCOUNTER — Other Ambulatory Visit: Payer: Self-pay | Admitting: *Deleted

## 2020-03-04 DIAGNOSIS — E039 Hypothyroidism, unspecified: Secondary | ICD-10-CM

## 2020-03-04 MED ORDER — LEVOTHYROXINE SODIUM 100 MCG PO TABS: 100.0000 ug | ORAL_TABLET | Freq: Every day | ORAL | 3 refills | Status: DC

## 2020-05-16 ENCOUNTER — Other Ambulatory Visit: Payer: Self-pay | Admitting: *Deleted

## 2020-05-16 DIAGNOSIS — I10 Essential (primary) hypertension: Secondary | ICD-10-CM

## 2020-05-16 MED ORDER — LABETALOL HCL 100 MG PO TABS: 100.0000 mg | ORAL_TABLET | Freq: Two times a day (BID) | ORAL | 0 refills | Status: DC

## 2020-05-28 ENCOUNTER — Other Ambulatory Visit: Payer: Self-pay | Admitting: Family

## 2020-05-28 MED ORDER — VALACYCLOVIR HCL 1 G PO TABS: 1000.0000 mg | ORAL_TABLET | Freq: Every day | ORAL | 3 refills | Status: DC

## 2020-06-25 ENCOUNTER — Other Ambulatory Visit: Payer: Self-pay | Admitting: *Deleted

## 2020-06-25 MED ORDER — PANTOPRAZOLE SODIUM 40 MG PO TBEC: 40.0000 mg | DELAYED_RELEASE_TABLET | Freq: Every day | ORAL | 0 refills | Status: DC

## 2020-09-10 ENCOUNTER — Encounter: Payer: 59 | Admitting: Family

## 2020-09-12 ENCOUNTER — Encounter: Payer: Self-pay | Admitting: Family

## 2020-09-12 ENCOUNTER — Ambulatory Visit (INDEPENDENT_AMBULATORY_CARE_PROVIDER_SITE_OTHER): Payer: 59 | Admitting: Family

## 2020-09-12 ENCOUNTER — Other Ambulatory Visit: Payer: Self-pay

## 2020-09-12 VITALS — BP 134/87 | HR 79 | Temp 97.5°F | Ht 63.0 in | Wt 285.2 lb

## 2020-09-12 DIAGNOSIS — B009 Herpesviral infection, unspecified: Secondary | ICD-10-CM

## 2020-09-12 DIAGNOSIS — E039 Hypothyroidism, unspecified: Secondary | ICD-10-CM

## 2020-09-12 DIAGNOSIS — G4700 Insomnia, unspecified: Secondary | ICD-10-CM

## 2020-09-12 DIAGNOSIS — F339 Major depressive disorder, recurrent, unspecified: Secondary | ICD-10-CM

## 2020-09-12 DIAGNOSIS — Z1159 Encounter for screening for other viral diseases: Secondary | ICD-10-CM

## 2020-09-12 DIAGNOSIS — I10 Essential (primary) hypertension: Secondary | ICD-10-CM

## 2020-09-12 DIAGNOSIS — F419 Anxiety disorder, unspecified: Secondary | ICD-10-CM

## 2020-09-12 DIAGNOSIS — K219 Gastro-esophageal reflux disease without esophagitis: Secondary | ICD-10-CM

## 2020-09-12 DIAGNOSIS — Z0001 Encounter for general adult medical examination with abnormal findings: Secondary | ICD-10-CM

## 2020-09-12 DIAGNOSIS — Z23 Encounter for immunization: Secondary | ICD-10-CM

## 2020-09-12 DIAGNOSIS — Z0000 Encounter for general adult medical examination without abnormal findings: Secondary | ICD-10-CM

## 2020-09-12 MED ORDER — PANTOPRAZOLE SODIUM 40 MG PO TBEC: 40.0000 mg | DELAYED_RELEASE_TABLET | Freq: Every day | ORAL | 1 refills | Status: DC

## 2020-09-12 MED ORDER — LEVOTHYROXINE SODIUM 100 MCG PO TABS: 100.0000 ug | ORAL_TABLET | Freq: Every day | ORAL | 3 refills | Status: DC

## 2020-09-12 MED ORDER — DULOXETINE HCL 60 MG PO CPEP: 60.0000 mg | ORAL_CAPSULE | Freq: Every day | ORAL | 1 refills | Status: DC

## 2020-09-12 MED ORDER — DULOXETINE HCL 30 MG PO CPEP: ORAL_CAPSULE | ORAL | 0 refills | Status: DC

## 2020-09-12 MED ORDER — LABETALOL HCL 100 MG PO TABS: 100.0000 mg | ORAL_TABLET | Freq: Two times a day (BID) | ORAL | 1 refills | Status: DC

## 2020-09-12 MED ORDER — VALACYCLOVIR HCL 1 G PO TABS: 1000.0000 mg | ORAL_TABLET | Freq: Every day | ORAL | 3 refills | Status: DC

## 2020-09-12 NOTE — Progress Notes (Signed)
Subjective:    Patient ID: Jacqueline Peterson, female    DOB: Dec 21, 1985, 34 y.o.   MRN: [id number]  Chief Complaint  Patient presents with   Annual Exam    with out pap    Depression    wants to start medication    PT presents to the office today for CPE without pap.  Depression        This is a chronic problem.  The current episode started more than 1 year ago.   The onset quality is gradual.   Associated symptoms include fatigue, insomnia, irritable, restlessness, decreased interest and sad.  Past treatments include nothing.  Past medical history includes thyroid problem and anxiety.   Hypertension This is a chronic problem. The current episode started more than 1 year ago. The problem has been resolved since onset. The problem is controlled. Associated symptoms include anxiety and malaise/fatigue. Pertinent negatives include no peripheral edema or shortness of breath. Risk factors for coronary artery disease include dyslipidemia, obesity and sedentary lifestyle. The current treatment provides moderate improvement. There is no history of CVA or heart failure. Identifiable causes of hypertension include a thyroid problem.  Gastroesophageal Reflux She complains of belching and heartburn. This is a chronic problem. The current episode started more than 1 year ago. The problem has been waxing and waning. Associated symptoms include fatigue. She has tried a PPI for the symptoms. The treatment provided moderate relief.  Thyroid Problem Presents for follow-up visit. Symptoms include anxiety and fatigue. Patient reports no depressed mood, diarrhea or dry skin. The symptoms have been stable. There is no history of heart failure.  Insomnia Primary symptoms: difficulty falling asleep, frequent awakening, malaise/fatigue.  The current episode started more than one year. PMH includes: depression.  Anxiety Presents for follow-up visit. Symptoms include excessive worry, insomnia, irritability,  nervous/anxious behavior and restlessness. Patient reports no depressed mood or shortness of breath. Symptoms occur most days.     Genital Herpes Takes Valtrex daily. States her last outbreak was about 6 months, because she went off her medication.    Review of Systems  Constitutional: Positive for fatigue, irritability and malaise/fatigue.  Respiratory: Negative for shortness of breath.   Gastrointestinal: Positive for heartburn. Negative for diarrhea.  Psychiatric/Behavioral: Positive for depression. The patient is nervous/anxious and has insomnia.   All other systems reviewed and are negative.  Family History  Problem Relation Age of Onset   Fibroids Mother    Thyroid disease Mother        Hypo   Basal cell carcinoma Mother    Hypertension Mother    Cancer Father 51       Non Hodgkins Adreanal carcinoma, Non small cell   Hypertension Father    Early death Father    ADD / ADHD Sister    Kidney disease Maternal Uncle    Hypertension Maternal Uncle    Anxiety disorder Maternal Uncle    Diabetes Maternal Grandmother        Type 1   Pancreatitis Maternal Grandmother    Aneurysm Maternal Grandmother        brain   Hypertension Maternal Grandfather    Rheumatic fever Maternal Grandfather    Heart disease Maternal Grandfather    Hypertension Paternal Grandmother    Stroke Paternal Grandmother    Heart disease Paternal Grandmother    Lymphoma Paternal Grandfather    Hypertension Paternal Grandfather    Diabetes Paternal Grandfather    Thyroid disease Paternal Grandfather  Heart disease Paternal Grandfather    Cancer Other        60's Breast Ca   Rheum arthritis Other    Breast cancer Maternal Aunt    Social History   Socioeconomic History   Marital status: Married    Spouse name: Not on file   Number of children: 0   Years of education: Not on file   Highest education level: Not on file  Occupational History   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 Dog and cat in house. Works at [employer].      Regular Exercise -  NO   Daily Caffeine Use:  1 tea   Social Determinants of Health   Financial Resource Strain:    Difficulty of Paying Living Expenses: Not on file  Food Insecurity:    Worried About [employer] in the Last Year: Not on file   Ran Out of Food in the Last Year: Not on file  Transportation Needs:    Lack of Transportation (Medical): Not on file   Lack of Transportation (Non-Medical): Not on file  Physical Activity:    Days of Exercise per Week: Not on file   Minutes of Exercise per Session: Not on file  Stress:    Feeling of Stress : Not on file  Social Connections:    Frequency of Communication with Friends and Family: Not on file   Frequency of Social Gatherings with Friends and Family: Not on file   Attends Religious Services: Not on file   Active Member of Clubs or Organizations: Not on file   Attends Archivist Meetings: Not on file   Marital Status: Not on file        Objective:   Physical Exam Vitals reviewed.  Constitutional:      General: She is irritable. She is not in acute distress.    Appearance: She is well-developed. She is obese.  HENT:     Head: Normocephalic and atraumatic.     Right Ear: Tympanic membrane normal.     Left Ear: Tympanic membrane normal.  Eyes:     Pupils: Pupils are equal, round, and reactive to  light.  Neck:     Thyroid: No thyromegaly.  Cardiovascular:     Rate and Rhythm: Normal rate and regular rhythm.     Heart sounds: Normal heart sounds. No murmur heard.   Pulmonary:     Effort: Pulmonary effort is normal. No respiratory distress.     Breath sounds: Normal breath sounds. No wheezing.  Abdominal:     General: Bowel sounds are normal. There is no distension.     Palpations: Abdomen is soft.     Tenderness: There is no abdominal tenderness.  Musculoskeletal:        General: No tenderness. Normal range of motion.  Cervical back: Normal range of motion and neck supple.  Skin:    General: Skin is warm and dry.  Neurological:     Mental Status: She is alert and oriented to person, place, and time.     Cranial Nerves: No cranial nerve deficit.     Deep Tendon Reflexes: Reflexes are normal and symmetric.  Psychiatric:        Behavior: Behavior normal.        Thought Content: Thought content normal.        Judgment: Judgment normal.       BP 134/87    Pulse 79    Temp (!) 97.5 F (36.4 C) (Temporal)    Ht 5' 3"  (1.6 m)    Wt 285 lb 3.2 oz (129.4 kg)    SpO2 100%    BMI 50.52 kg/m      Assessment & Plan:  AVEYA BEAL comes in today with chief complaint of Annual Exam (with out pap ) and Depression (wants to start medication )   Diagnosis and orders addressed:  1. Essential hypertension - labetalol (NORMODYNE) 100 MG tablet; Take 1 tablet (100 mg total) by mouth 2 (two) times daily.  Dispense: 180 tablet; Refill: 1 - CMP14+EGFR - CBC with Differential/Platelet  2. Hypothyroidism, unspecified type - levothyroxine (SYNTHROID) 100 MCG tablet; Take 1 tablet (100 mcg total) by mouth daily before breakfast.  Dispense: 90 tablet; Refill: 3 - CMP14+EGFR - CBC with Differential/Platelet  3. Need for immunization against influenza - Flu Vaccine QUAD 36+ mos IM - CMP14+EGFR - CBC with Differential/Platelet  4. Annual physical exam - CMP14+EGFR - CBC  with Differential/Platelet - Lipid panel - TSH - Hepatitis C antibody  5. Primary hypertension - CMP14+EGFR - CBC with Differential/Platelet  6. Gastroesophageal reflux disease, unspecified whether esophagitis present - CMP14+EGFR - CBC with Differential/Platelet  7. Anxiety -Will add Cymbalta 30 mg then after three weeks will increase to 60 mg Stress management  - CMP14+EGFR - CBC with Differential/Platelet - DULoxetine (CYMBALTA) 30 MG capsule; Take 1 capsule (30 mg total) by mouth daily for 21 days, THEN 2 capsules (60 mg total) daily for 21 days.  Dispense: 63 capsule; Refill: 0 - DULoxetine (CYMBALTA) 60 MG capsule; Take 1 capsule (60 mg total) by mouth daily.  Dispense: 90 capsule; Refill: 1  8. Herpes - CMP14+EGFR - CBC with Differential/Platelet  9. Insomnia, unspecified type - CMP14+EGFR - CBC with Differential/Platelet  10. Morbid obesity ([place]) - CMP14+EGFR - CBC with Differential/Platelet  11. Depression, recurrent ([place]) - CMP14+EGFR - CBC with Differential/Platelet - DULoxetine (CYMBALTA) 30 MG capsule; Take 1 capsule (30 mg total) by mouth daily for 21 days, THEN 2 capsules (60 mg total) daily for 21 days.  Dispense: 63 capsule; Refill: 0 - DULoxetine (CYMBALTA) 60 MG capsule; Take 1 capsule (60 mg total) by mouth daily.  Dispense: 90 capsule; Refill: 1  12. Need for hepatitis C screening test - CMP14+EGFR - CBC with Differential/Platelet - Hepatitis C antibody   Labs pending Health Maintenance reviewed Diet and exercise encouraged  Follow up plan: 2 months    Evelina Dun, FNP

## 2020-09-12 NOTE — Patient Instructions (Signed)
Major Depressive Disorder, Adult Major depressive disorder (MDD) is a mental health condition. It may also be called clinical depression or unipolar depression. MDD usually causes feelings of sadness, hopelessness, or helplessness. MDD can also cause physical symptoms. It can interfere with work, school, relationships, and other everyday activities. MDD may be mild, moderate, or severe. It may occur once (single episode major depressive disorder) or it may occur multiple times (recurrent major depressive disorder). What are the causes? The exact cause of this condition is not known. MDD is most likely caused by a combination of things, which may include:  Genetic factors. These are traits that are passed along from parent to child.  Individual factors. Your personality, your behavior, and the way you handle your thoughts and feelings may contribute to MDD. This includes personality traits and behaviors learned from others.  Physical factors, such as: ? Differences in the part of your brain that controls emotion. This part of your brain may be different than it is in people who do not have MDD. ? Long-term (chronic) medical or psychiatric illnesses.  Social factors. Traumatic experiences or major life changes may play a role in the development of MDD. What increases the risk? This condition is more likely to develop in women. The following factors may also make you more likely to develop MDD:  A family history of depression.  Troubled family relationships.  Abnormally low levels of certain brain chemicals.  Traumatic events in childhood, especially abuse or the loss of a parent.  Being under a lot of stress, or long-term stress, especially from upsetting life experiences or losses.  A history of: ? Chronic physical illness. ? Other mental health disorders. ? Substance abuse.  Poor living conditions.  Experiencing social exclusion or discrimination on a regular basis. What are the  signs or symptoms? The main symptoms of MDD typically include:  Constant depressed or irritable mood.  Loss of interest in things and activities. MDD symptoms may also include:  Sleeping or eating too much or too little.  Unexplained weight change.  Fatigue or low energy.  Feelings of worthlessness or guilt.  Difficulty thinking clearly or making decisions.  Thoughts of suicide or of harming others.  Physical agitation or weakness.  Isolation. Severe cases of MDD may also occur with other symptoms, such as:  Delusions or hallucinations, in which you imagine things that are not real (psychotic depression).  Low-level depression that lasts at least a year (chronic depression or persistent depressive disorder).  Extreme sadness and hopelessness (melancholic depression).  Trouble speaking and moving (catatonic depression). How is this diagnosed? This condition may be diagnosed based on:  Your symptoms.  Your medical history, including your mental health history. This may involve tests to evaluate your mental health. You may be asked questions about your lifestyle, including any drug and alcohol use, and how long you have had symptoms of MDD.  A physical exam.  Blood tests to rule out other conditions. You must have a depressed mood and at least four other MDD symptoms most of the day, nearly every day in the same 2-week timeframe before your health care provider can confirm a diagnosis of MDD. How is this treated? This condition is usually treated by mental health professionals, such as psychologists, psychiatrists, and clinical social workers. You may need more than one type of treatment. Treatment may include:  Psychotherapy. This is also called talk therapy or counseling. Types of psychotherapy include: ? Cognitive behavioral therapy (CBT). This type of therapy   teaches you to recognize unhealthy feelings, thoughts, and behaviors, and replace them with positive thoughts  and actions. ? Interpersonal therapy (IPT). This helps you to improve the way you relate to and communicate with others. ? Family therapy. This treatment includes members of your family.  Medicine to treat anxiety and depression, or to help you control certain emotions and behaviors.  Lifestyle changes, such as: ? Limiting alcohol and drug use. ? Exercising regularly. ? Getting plenty of sleep. ? Making healthy eating choices. ? Spending more time outdoors.  Treatments involving stimulation of the brain can be used in situations with extremely severe symptoms, or when medicine or other therapies do not work over time. These treatments include electroconvulsive therapy, transcranial magnetic stimulation, and vagal nerve stimulation. Follow these instructions at home: Activity  Return to your normal activities as told by your health care provider.  Exercise regularly and spend time outdoors as told by your health care provider. General instructions  Take over-the-counter and prescription medicines only as told by your health care provider.  Do not drink alcohol. If you drink alcohol, limit your alcohol intake to no more than 1 drink a day for nonpregnant women and 2 drinks a day for men. One drink equals 12 oz of beer, 5 oz of wine, or 1 oz of hard liquor. Alcohol can affect any antidepressant medicines you are taking. Talk to your health care provider about your alcohol use.  Eat a healthy diet and get plenty of sleep.  Find activities that you enjoy doing, and make time to do them.  Consider joining a support group. Your health care provider may be able to recommend a support group.  Keep all follow-up visits as told by your health care provider. This is important. Where to find more information National Alliance on Mental Illness  www.nami.org [place] National Institute of Mental Health  www.nimh.nih.gov National Suicide Prevention Lifeline  1-800-273-TALK (8255). This is  free, 24-hour help. Contact a health care provider if:  Your symptoms get worse.  You develop new symptoms. Get help right away if:  You self-harm.  You have serious thoughts about hurting yourself or others.  You see, hear, taste, smell, or feel things that are not present (hallucinate). This information is not intended to replace advice given to you by your health care provider. Make sure you discuss any questions you have with your health care provider. Document Revised: 10/15/2017 Document Reviewed: 05/13/2016 Elsevier Patient Education  2020 Elsevier Inc.  

## 2020-09-13 LAB — CMP14+EGFR
ALT: 15 IU/L (ref 0–32)
AST: 14 IU/L (ref 0–40)
Albumin/Globulin Ratio: 1.7 (ref 1.2–2.2)
Albumin: 4.3 g/dL (ref 3.8–4.8)
Alkaline Phosphatase: 82 IU/L (ref 44–121)
BUN/Creatinine Ratio: 10 (ref 9–23)
BUN: 8 mg/dL (ref 6–20)
Bilirubin Total: 0.4 mg/dL (ref 0.0–1.2)
CO2: 20 mmol/L (ref 20–29)
Calcium: 9.6 mg/dL (ref 8.7–10.2)
Chloride: 103 mmol/L (ref 96–106)
Creatinine, Ser: 0.83 mg/dL (ref 0.57–1.00)
GFR calc Af Amer: 106 mL/min/{1.73_m2} (ref >59)
GFR calc non Af Amer: 92 mL/min/{1.73_m2} (ref >59)
Globulin, Total: 2.5 g/dL (ref 1.5–4.5)
Glucose: 85 mg/dL (ref 65–99)
Potassium: 4.8 mmol/L (ref 3.5–5.2)
Sodium: 140 mmol/L (ref 134–144)
Total Protein: 6.8 g/dL (ref 6.0–8.5)

## 2020-09-13 LAB — CBC WITH DIFFERENTIAL/PLATELET
Basophils Absolute: 0.1 10*3/uL (ref 0.0–0.2)
Basos: 1 %
EOS (ABSOLUTE): 0.1 10*3/uL (ref 0.0–0.4)
Eos: 2 %
Hematocrit: 43.5 % (ref 34.0–46.6)
Hemoglobin: 14.5 g/dL (ref 11.1–15.9)
Immature Grans (Abs): 0 10*3/uL (ref 0.0–0.1)
Immature Granulocytes: 0 %
Lymphocytes Absolute: 2.4 10*3/uL (ref 0.7–3.1)
Lymphs: 32 %
MCH: 29.3 pg (ref 26.6–33.0)
MCHC: 33.3 g/dL (ref 31.5–35.7)
MCV: 88 fL (ref 79–97)
Monocytes Absolute: 0.4 10*3/uL (ref 0.1–0.9)
Monocytes: 6 %
Neutrophils Absolute: 4.3 10*3/uL (ref 1.4–7.0)
Neutrophils: 59 %
Platelets: 281 10*3/uL (ref 150–450)
RBC: 4.95 x10E6/uL (ref 3.77–5.28)
RDW: 13 % (ref 11.7–15.4)
WBC: 7.3 10*3/uL (ref 3.4–10.8)

## 2020-09-13 LAB — LIPID PANEL
Chol/HDL Ratio: 5.4 ratio — ABNORMAL HIGH (ref 0.0–4.4)
Cholesterol, Total: 215 mg/dL — ABNORMAL HIGH (ref 100–199)
HDL: 40 mg/dL (ref >39)
LDL Chol Calc (NIH): 147 mg/dL — ABNORMAL HIGH (ref 0–99)
Triglycerides: 152 mg/dL — ABNORMAL HIGH (ref 0–149)
VLDL Cholesterol Cal: 28 mg/dL (ref 5–40)

## 2020-09-13 LAB — TSH: TSH: 0.805 u[IU]/mL (ref 0.450–4.500)

## 2020-09-13 LAB — HEPATITIS C ANTIBODY: Hep C Virus Ab: 0.3 s/co ratio (ref 0.0–0.9)

## 2020-09-16 ENCOUNTER — Other Ambulatory Visit: Payer: Self-pay | Admitting: Family

## 2020-09-16 DIAGNOSIS — L989 Disorder of the skin and subcutaneous tissue, unspecified: Secondary | ICD-10-CM

## 2020-09-18 ENCOUNTER — Telehealth: Payer: Self-pay | Admitting: Physician Assistant

## 2020-09-18 NOTE — Telephone Encounter (Signed)
Patient called for a referral appointment from [facility].  Patient is scheduled for 01/24/2020 at 2:45 with [facility], PA-C.

## 2020-11-19 ENCOUNTER — Ambulatory Visit: Payer: 59 | Admitting: Family

## 2020-11-20 ENCOUNTER — Ambulatory Visit: Payer: 59

## 2020-11-25 ENCOUNTER — Encounter: Payer: Self-pay | Admitting: Family

## 2021-01-10 ENCOUNTER — Encounter: Payer: Self-pay | Admitting: Family

## 2021-01-10 ENCOUNTER — Other Ambulatory Visit: Payer: Self-pay

## 2021-01-10 ENCOUNTER — Ambulatory Visit (INDEPENDENT_AMBULATORY_CARE_PROVIDER_SITE_OTHER): Payer: 59 | Admitting: Family

## 2021-01-10 VITALS — BP 121/81 | HR 67 | Temp 97.4°F | Ht 63.0 in | Wt 276.8 lb

## 2021-01-10 DIAGNOSIS — F419 Anxiety disorder, unspecified: Secondary | ICD-10-CM

## 2021-01-10 DIAGNOSIS — R252 Cramp and spasm: Secondary | ICD-10-CM

## 2021-01-10 DIAGNOSIS — R5383 Other fatigue: Secondary | ICD-10-CM | POA: Diagnosis not present

## 2021-01-10 DIAGNOSIS — F339 Major depressive disorder, recurrent, unspecified: Secondary | ICD-10-CM | POA: Diagnosis not present

## 2021-01-10 MED ORDER — DULOXETINE HCL 60 MG PO CPEP: 60.0000 mg | ORAL_CAPSULE | Freq: Every day | ORAL | 1 refills | Status: DC

## 2021-01-10 NOTE — Progress Notes (Signed)
Subjective:    Patient ID: Jacqueline Peterson, female    DOB: Mar 31, 1986, 35 y.o.   MRN: [id number]  Chief Complaint  Patient presents with  . Medication Management    Duloxetine working great. Having bad cramps and muscle. Patient also feeling being tired and fatigue. Wants magnesium , b12 and vit d checked      Pt presents to the office today for follow up on depression since adding Cymbalta. She reports her anxiety and depression is greatly improved.   She is reports she is feeling fatigued. She is complaining of muscle cramps in her jaw, back, hands, legs, and feet. She has started OTC Magnesium that has seemed to help.  Depression        This is a chronic problem.  The current episode started more than 1 year ago.   The onset quality is gradual.   The problem occurs intermittently.  Associated symptoms include irritable and sad.  Associated symptoms include no helplessness and no hopelessness.  Past treatments include SNRIs - Serotonin and norepinephrine reuptake inhibitors.  Compliance with treatment is good.  Past medical history includes anxiety.   Anxiety Presents for follow-up visit. Symptoms include excessive worry, irritability and nervous/anxious behavior. Symptoms occur most days. The severity of symptoms is moderate. The quality of sleep is good.        Review of Systems  Constitutional: Positive for irritability.  Psychiatric/Behavioral: Positive for depression. The patient is nervous/anxious.   All other systems reviewed and are negative.      Objective:   Physical Exam Vitals reviewed.  Constitutional:      General: She is irritable. She is not in acute distress.    Appearance: She is well-developed and well-nourished. She is obese.  HENT:     Head: Normocephalic and atraumatic.     Right Ear: Tympanic membrane normal.     Left Ear: Tympanic membrane normal.     Mouth/Throat:     Mouth: Oropharynx is clear and moist.  Eyes:     Pupils: Pupils are  equal, round, and reactive to light.  Neck:     Thyroid: No thyromegaly.  Cardiovascular:     Rate and Rhythm: Normal rate and regular rhythm.     Pulses: Intact distal pulses.     Heart sounds: Normal heart sounds. No murmur heard.   Pulmonary:     Effort: Pulmonary effort is normal. No respiratory distress.     Breath sounds: Normal breath sounds. No wheezing.  Abdominal:     General: Bowel sounds are normal. There is no distension.     Palpations: Abdomen is soft.     Tenderness: There is no abdominal tenderness.  Musculoskeletal:        General: No tenderness or edema. Normal range of motion.     Cervical back: Normal range of motion and neck supple.  Skin:    General: Skin is warm and dry.  Neurological:     Mental Status: She is alert and oriented to person, place, and time.     Cranial Nerves: No cranial nerve deficit.     Deep Tendon Reflexes: Reflexes are normal and symmetric.  Psychiatric:        Mood and Affect: Mood and affect normal.        Behavior: Behavior normal.        Thought Content: Thought content normal.        Judgment: Judgment normal.     BP 121/81  Pulse 67   Temp (!) 97.4 F (36.3 C) (Temporal)   Ht 5\' 3"  (1.6 m)   Wt 276 lb 12.8 oz (125.6 kg)   BMI 49.03 kg/m        Assessment & Plan:  TYJANAE BARTEK comes in today with chief complaint of Medication Management (Duloxetine working great. Having bad cramps and muscle. Patient also feeling being tired and fatigue. Wants magnesium , b12 and vit d checked   )   Diagnosis and orders addressed:  1. Anxiety - DULoxetine (CYMBALTA) 60 MG capsule; Take 1 capsule (60 mg total) by mouth daily.  Dispense: 90 capsule; Refill: 1 - Magnesium - Anemia Profile B - VITAMIN D 25 Hydroxy (Vit-D Deficiency, Fractures)  2. Depression, recurrent ([place]) - DULoxetine (CYMBALTA) 60 MG capsule; Take 1 capsule (60 mg total) by mouth daily.  Dispense: 90 capsule; Refill: 1 - Magnesium - Anemia Profile  B - VITAMIN D 25 Hydroxy (Vit-D Deficiency, Fractures)  3. Muscle cramp - Magnesium - Anemia Profile B - VITAMIN D 25 Hydroxy (Vit-D Deficiency, Fractures)  4. Other fatigue - Magnesium - Anemia Profile B - VITAMIN D 25 Hydroxy (Vit-D Deficiency, Fractures)   Labs pending Health Maintenance reviewed Diet and exercise encouraged  Follow up plan: 6 months   Evelina Dun, FNP

## 2021-01-10 NOTE — Patient Instructions (Signed)
Muscle Cramps and Spasms Muscle cramps and spasms occur when a muscle or muscles tighten and you have no control over this tightening (involuntary muscle contraction). They are a common problem and can develop in any muscle. The most common place is in the calf muscles of the leg. Muscle cramps and muscle spasms are both involuntary muscle contractions, but there are some differences between the two:  Muscle cramps are painful. They come and go and may last for a few seconds or up to 15 minutes. Muscle cramps are often more forceful and last longer than muscle spasms.  Muscle spasms may or may not be painful. They may also last just a few seconds or much longer. Certain medical conditions, such as diabetes or Parkinson's disease, can make it more likely to develop cramps or spasms. However, cramps or spasms are usually not caused by a serious underlying problem. Common causes include:  Doing more physical work or exercise than your body is ready for (overexertion).  Overuse from repeating certain movements too many times.  Remaining in a certain position for a long period of time.  Improper preparation, form, or technique while playing a sport or doing an activity.  Dehydration.  Injury.  Side effects of some medicines.  Abnormally low levels of the salts and minerals in your blood (electrolytes), especially potassium and calcium. This could happen if you are taking water pills (diuretics) or if you are pregnant. In many cases, the cause of muscle cramps or spasms is not known. Follow these instructions at home: Managing pain and stiffness  Try massaging, stretching, and relaxing the affected muscle. Do this for several minutes at a time.  If directed, apply heat to tight or tense muscles as often as told by your health care provider. Use the heat source that your health care provider recommends, such as a moist heat pack or a heating pad. ? Place a towel between your skin and the heat  source. ? Leave the heat on for 20-30 minutes. ? Remove the heat if your skin turns bright red. This is especially important if you are unable to feel pain, heat, or cold. You may have a greater risk of getting burned.  If directed, put ice on the affected area. This may help if you are sore or have pain after a cramp or spasm. ? Put ice in a plastic bag. ? Place a towel between your skin and the bag. ? Leavethe ice on for 20 minutes, 2-3 times a day.  Try taking hot showers or baths to help relax tight muscles.      Eating and drinking  Drink enough fluid to keep your urine pale yellow. Staying well hydrated may help prevent cramps or spasms.  Eat a healthy diet that includes plenty of nutrients to help your muscles function. A healthy diet includes fruits and vegetables, lean protein, whole grains, and low-fat or nonfat dairy products. General instructions  If you are having frequent cramps, avoid intense exercise for several days.  Take over-the-counter and prescription medicines only as told by your health care provider.  Pay attention to any changes in your symptoms.  Keep all follow-up visits as told by your health care provider. This is important. Contact a health care provider if:  Your cramps or spasms get more severe or happen more often.  Your cramps or spasms do not improve over time. Summary  Muscle cramps and spasms occur when a muscle or muscles tighten and you have no control over  this tightening (involuntary muscle contraction).  The most common place for cramps or spasms to occur is in the calf muscles of the leg.  Massaging, stretching, and relaxing the affected muscle may relieve the cramp or spasm.  Drink enough fluid to keep your urine pale yellow. Staying well hydrated may help prevent cramps or spasms. This information is not intended to replace advice given to you by your health care provider. Make sure you discuss any questions you have with your  health care provider. Document Revised: 03/28/2018 Document Reviewed: 03/28/2018 Elsevier Patient Education  [place].

## 2021-01-11 LAB — ANEMIA PROFILE B
Basophils Absolute: 0.1 10*3/uL (ref 0.0–0.2)
Basos: 1 %
EOS (ABSOLUTE): 0.1 10*3/uL (ref 0.0–0.4)
Eos: 2 %
Ferritin: 36 ng/mL (ref 15–150)
Folate: 3.8 ng/mL (ref >3.0)
Hematocrit: 40.9 % (ref 34.0–46.6)
Hemoglobin: 13.6 g/dL (ref 11.1–15.9)
Immature Grans (Abs): 0 10*3/uL (ref 0.0–0.1)
Immature Granulocytes: 0 %
Iron Saturation: 14 % — ABNORMAL LOW (ref 15–55)
Iron: 46 ug/dL (ref 27–159)
Lymphocytes Absolute: 1.8 10*3/uL (ref 0.7–3.1)
Lymphs: 27 %
MCH: 29 pg (ref 26.6–33.0)
MCHC: 33.3 g/dL (ref 31.5–35.7)
MCV: 87 fL (ref 79–97)
Monocytes Absolute: 0.5 10*3/uL (ref 0.1–0.9)
Monocytes: 7 %
Neutrophils Absolute: 4.4 10*3/uL (ref 1.4–7.0)
Neutrophils: 63 %
Platelets: 286 10*3/uL (ref 150–450)
RBC: 4.69 x10E6/uL (ref 3.77–5.28)
RDW: 12.6 % (ref 11.7–15.4)
Retic Ct Pct: 1.3 % (ref 0.6–2.6)
Total Iron Binding Capacity: 325 ug/dL (ref 250–450)
UIBC: 279 ug/dL (ref 131–425)
Vitamin B-12: 596 pg/mL (ref 232–1245)
WBC: 6.9 10*3/uL (ref 3.4–10.8)

## 2021-01-11 LAB — MAGNESIUM: Magnesium: 2.1 mg/dL (ref 1.6–2.3)

## 2021-01-11 LAB — VITAMIN D 25 HYDROXY (VIT D DEFICIENCY, FRACTURES): Vit D, 25-Hydroxy: 19.4 ng/mL — ABNORMAL LOW (ref 30.0–100.0)

## 2021-01-13 ENCOUNTER — Other Ambulatory Visit: Payer: Self-pay | Admitting: Family

## 2021-01-13 MED ORDER — VITAMIN D (ERGOCALCIFEROL) 1.25 MG (50000 UNIT) PO CAPS: 50000.0000 [IU] | ORAL_CAPSULE | ORAL | 3 refills | Status: DC

## 2021-01-23 ENCOUNTER — Ambulatory Visit: Payer: 59 | Admitting: Physician Assistant

## 2021-01-23 ENCOUNTER — Other Ambulatory Visit: Payer: Self-pay

## 2021-01-23 DIAGNOSIS — L719 Rosacea, unspecified: Secondary | ICD-10-CM

## 2021-01-23 DIAGNOSIS — Z1283 Encounter for screening for malignant neoplasm of skin: Secondary | ICD-10-CM | POA: Diagnosis not present

## 2021-01-23 DIAGNOSIS — L918 Other hypertrophic disorders of the skin: Secondary | ICD-10-CM

## 2021-01-23 DIAGNOSIS — Q825 Congenital non-neoplastic nevus: Secondary | ICD-10-CM

## 2021-01-23 DIAGNOSIS — L821 Other seborrheic keratosis: Secondary | ICD-10-CM

## 2021-01-23 DIAGNOSIS — D1800 Hemangioma unspecified site: Secondary | ICD-10-CM

## 2021-01-23 MED ORDER — MINOCYCLINE HCL 50 MG PO CAPS: 50.0000 mg | ORAL_CAPSULE | Freq: Every day | ORAL | 8 refills | Status: DC

## 2021-01-23 MED ORDER — METRONIDAZOLE 0.75 % EX CREA: TOPICAL_CREAM | Freq: Every day | CUTANEOUS | 8 refills | Status: AC

## 2021-02-07 ENCOUNTER — Other Ambulatory Visit: Payer: Self-pay

## 2021-02-07 ENCOUNTER — Ambulatory Visit: Payer: 59 | Admitting: Podiatry

## 2021-02-07 DIAGNOSIS — M21961 Unspecified acquired deformity of right lower leg: Secondary | ICD-10-CM

## 2021-02-10 ENCOUNTER — Encounter: Payer: Self-pay | Admitting: Physician Assistant

## 2021-02-10 NOTE — Progress Notes (Signed)
   New Patient   Subjective  Jacqueline Peterson is a 35 y.o. female who presents for the following: Annual Exam (Full body skin check. Thinks she has rosacea on her face, wants to discuss treatments x year never diagnosed. Spot on the right cheek near the chin x 3/4 years, comes and goes, dry and scaly, no bleeding./Per patient she has a cyst on the left side of her nose in crease. /Spot on upper left shoulder, dark, patient thinks its possibly growing./Spots on the upper part of the buttocks near crack x years. one left side and one under buttock cheek on left side, possible skin tag.).   The following portions of the chart were reviewed this encounter and updated as appropriate:  Tobacco  Allergies  Meds  Problems  Med Hx  Surg Hx  Fam Hx      Objective  Well appearing patient in no apparent distress; mood and affect are within normal limits.  A full examination was performed including scalp, head, eyes, ears, nose, lips, neck, chest, axillae, abdomen, back, buttocks, bilateral upper extremities, bilateral lower extremities, hands, feet, fingers, toes, fingernails, and toenails. All findings within normal limits unless otherwise noted below.  Objective  head to toe: No atypical nevi No signs of non-mole skin cancer.  Objective  Gluteal Crease: Tan-brown symmetric papule   Images      Objective  Left Abdomen (side) - Upper: Red papule  Objective  Right Buccal Cheek: Centrifacial erythema with or without papules/pustules.   Objective  Left Upper Back: Benign Stuck-on, waxy, tan-brown papules and plaques. --Discussed benign etiology and prognosis.   Objective  Pubic: Fleshy, skin-colored sessile and pedunculated papules.     Assessment & Plan  Encounter for screening for malignant neoplasm of skin head to toe  Yearly skin check  Congenital non-neoplastic nevus Gluteal Crease  observe  Hemangioma, unspecified site Left Abdomen (side) - Upper  Ok to  leave if stable   Rosacea Right Buccal Cheek  Ordered Medications: minocycline (MINOCIN) 50 MG capsule metroNIDAZOLE (METROCREAM) 0.75 % cream  Seborrheic keratosis Left Upper Back  Okay to leave if stable  Skin tag Pubic  Okay to leave if stable  I, Danelle Curiale, PA-C, have reviewed all documentation for this visit. The documentation on 02/10/21 for the exam, diagnosis, procedures, and orders are all accurate and complete.

## 2021-02-14 NOTE — Progress Notes (Signed)
  Subjective:  Patient ID: Jacqueline Peterson, female    DOB: Jun 04, 1986,  MRN: [id number]  Chief Complaint  Patient presents with  . Callouses    Right sub 3rd plantar forefoot painful callous    35 y.o. female presents with the above complaint. History confirmed with patient.   Objective:  Physical Exam: warm, good capillary refill, no trophic changes or ulcerative lesions, normal DP and PT pulses and normal sensory exam. Right third metatarsal hyperkeratotic lesion with pain to palpation and punctate core   Assessment:   1. Metatarsal deformity, right     Plan:  Patient was evaluated and treated and all questions answered.  Plantarflexed metatarsal with hyperkeratosis -Educated on etiology.  Courtesy debridement for patient.  Discussed offloading strategies and self-care  No follow-ups on file.

## 2021-04-10 ENCOUNTER — Other Ambulatory Visit: Payer: Self-pay | Admitting: Family

## 2021-04-10 MED ORDER — BACLOFEN 10 MG PO TABS: 10.0000 mg | ORAL_TABLET | Freq: Three times a day (TID) | ORAL | 0 refills | Status: DC

## 2021-05-27 ENCOUNTER — Ambulatory Visit: Payer: 59 | Admitting: Podiatry

## 2021-05-27 ENCOUNTER — Ambulatory Visit (INDEPENDENT_AMBULATORY_CARE_PROVIDER_SITE_OTHER): Payer: 59

## 2021-05-27 ENCOUNTER — Other Ambulatory Visit: Payer: Self-pay

## 2021-05-27 DIAGNOSIS — M7752 Other enthesopathy of left foot: Secondary | ICD-10-CM | POA: Diagnosis not present

## 2021-05-27 DIAGNOSIS — M79672 Pain in left foot: Secondary | ICD-10-CM

## 2021-05-27 NOTE — Progress Notes (Signed)
  Subjective:  Patient ID: Jacqueline Peterson, female    DOB: 1986/06/04,  MRN: [id number]  Chief Complaint  Patient presents with   Foot Injury    Left foot injury 4 weeks ago. Pt states she hit her foot in a pool. X 4 weeks ago.    35 y.o. female presents with the above complaint. History confirmed with patient. States her foot was [place] Objective:  Physical Exam: warm, good capillary refill, no trophic changes or ulcerative lesions, normal DP and PT pulses, and normal sensory exam. Left Foot: POP left 5th met base. No crepitus noted. No warmth, edema, contusion    Radiographs: X-ray of the left foot: no fracture, dislocation, swelling or degenerative changes noted Assessment:   1. Tendonitis of ankle, left   2. Foot pain, left    Plan:  Patient was evaluated and treated and all questions answered.  Enthesitis left -X-rays reviewed as above -Dispensed Tri-Lock ankle brace.  Patient educated on use -D/c brace in 2 weeks. F/u if pain persists. Consider injection at that time.  No follow-ups on file.

## 2021-06-05 ENCOUNTER — Encounter: Payer: Self-pay | Admitting: Family

## 2021-06-05 ENCOUNTER — Ambulatory Visit (INDEPENDENT_AMBULATORY_CARE_PROVIDER_SITE_OTHER): Payer: 59 | Admitting: Family

## 2021-06-05 DIAGNOSIS — F411 Generalized anxiety disorder: Secondary | ICD-10-CM | POA: Diagnosis not present

## 2021-06-05 DIAGNOSIS — F339 Major depressive disorder, recurrent, unspecified: Secondary | ICD-10-CM | POA: Diagnosis not present

## 2021-06-05 MED ORDER — DESVENLAFAXINE SUCCINATE ER 100 MG PO TB24: 100.0000 mg | ORAL_TABLET | Freq: Every day | ORAL | 1 refills | Status: DC

## 2021-06-05 MED ORDER — DESVENLAFAXINE SUCCINATE ER 50 MG PO TB24: ORAL_TABLET | ORAL | 0 refills | Status: DC

## 2021-06-05 NOTE — Progress Notes (Signed)
Virtual Visit  Note Due to COVID-19 pandemic this visit was conducted virtually. This visit type was conducted due to national recommendations for restrictions regarding the COVID-19 Pandemic (e.g. social distancing, sheltering in place) in an effort to limit this patient's exposure and mitigate transmission in our community. All issues noted in this document were discussed and addressed.  A physical exam was not performed with this format.  I connected with Jacqueline Peterson on 06/05/21 at 12:35 pm  by telephone and verified that I am speaking with the correct person using two identifiers. Jacqueline Peterson is currently located at work and no one is currently with her during visit. The provider, Evelina Dun, FNP is located in their office at time of visit.  I discussed the limitations, risks, security and privacy concerns of performing an evaluation and management service by telephone and the availability of in person appointments. I also discussed with the patient that there may be a patient responsible charge related to this service. The patient expressed understanding and agreed to proceed.   History and Present Illness:  PT calls the office today with worsening depression and gad. She weaned herself off the Cymbalta 60 mg because of muscle spasms about a month ago. She reports since stopping she has not had muscle spasms.  Depression        This is a chronic problem.  The current episode started more than 1 year ago.   The onset quality is gradual.   The problem occurs intermittently.  Associated symptoms include irritable, restlessness, decreased interest and sad.  Associated symptoms include no helplessness and no hopelessness.  Past treatments include nothing.  Past medical history includes anxiety.   Anxiety Presents for follow-up visit. Symptoms include depressed mood, excessive worry, irritability, nervous/anxious behavior, palpitations and restlessness.       Review of  Systems  Constitutional:  Positive for irritability.  Cardiovascular:  Positive for palpitations.  Psychiatric/Behavioral:  Positive for depression. The patient is nervous/anxious.   All other systems reviewed and are negative.   Observations/Objective: No SOB or distress noted  Assessment and Plan: 1. Depression, recurrent ([place]) Start Pristiq 50 mg for two weeks then increase 100 mg  - desvenlafaxine (PRISTIQ) 50 MG 24 hr tablet; Take 1 tablet (50 mg total) by mouth daily for 14 days, THEN 2 tablets (100 mg total) daily for 14 days.  Dispense: 42 tablet; Refill: 0 - desvenlafaxine (PRISTIQ) 100 MG 24 hr tablet; Take 1 tablet (100 mg total) by mouth daily.  Dispense: 90 tablet; Refill: 1  2. GAD (generalized anxiety disorder) - desvenlafaxine (PRISTIQ) 50 MG 24 hr tablet; Take 1 tablet (50 mg total) by mouth daily for 14 days, THEN 2 tablets (100 mg total) daily for 14 days.  Dispense: 42 tablet; Refill: 0 - desvenlafaxine (PRISTIQ) 100 MG 24 hr tablet; Take 1 tablet (100 mg total) by mouth daily.  Dispense: 90 tablet; Refill: 1     I discussed the assessment and treatment plan with the patient. The patient was provided an opportunity to ask questions and all were answered. The patient agreed with the plan and demonstrated an understanding of the instructions.   The patient was advised to call back or seek an in-person evaluation if the symptoms worsen or if the condition fails to improve as anticipated.  The above assessment and management plan was discussed with the patient. The patient verbalized understanding of and has agreed to the management plan. Patient is aware to call the clinic  if symptoms persist or worsen. Patient is aware when to return to the clinic for a follow-up visit. Patient educated on when it is appropriate to go to the emergency department.   Time call ended:  12:48 pm   I provided 12 minutes of  non face-to-face time during this encounter.    Evelina Dun,  FNP

## 2021-10-06 ENCOUNTER — Encounter: Payer: Self-pay | Admitting: Family

## 2021-10-06 ENCOUNTER — Telehealth: Payer: Self-pay | Admitting: Family

## 2021-10-06 ENCOUNTER — Other Ambulatory Visit: Payer: Self-pay

## 2021-10-06 ENCOUNTER — Ambulatory Visit (INDEPENDENT_AMBULATORY_CARE_PROVIDER_SITE_OTHER): Payer: 59 | Admitting: Family

## 2021-10-06 VITALS — BP 125/73 | HR 64 | Temp 97.9°F | Ht 63.0 in | Wt 295.2 lb

## 2021-10-06 DIAGNOSIS — I10 Essential (primary) hypertension: Secondary | ICD-10-CM

## 2021-10-06 DIAGNOSIS — F419 Anxiety disorder, unspecified: Secondary | ICD-10-CM

## 2021-10-06 DIAGNOSIS — E039 Hypothyroidism, unspecified: Secondary | ICD-10-CM

## 2021-10-06 DIAGNOSIS — Z23 Encounter for immunization: Secondary | ICD-10-CM | POA: Diagnosis not present

## 2021-10-06 DIAGNOSIS — F321 Major depressive disorder, single episode, moderate: Secondary | ICD-10-CM

## 2021-10-06 NOTE — Progress Notes (Signed)
Subjective:    Patient ID: Jacqueline Peterson, female    DOB: 02/22/1986, 35 y.o.   MRN: [id number]  Chief Complaint  Patient presents with   Medical Management of Chronic Issues   PT presents to the office today to follow up on GAD and Depression. She is currently taking Pristiq 100 mg. She reports this is doing well for her.  Anxiety Presents for follow-up visit. Symptoms include depressed mood, excessive worry, irritability, nervous/anxious behavior and restlessness. Patient reports no shortness of breath. Symptoms occur most days. The severity of symptoms is moderate.    Depression        This is a chronic problem.  The current episode started more than 1 year ago.   Associated symptoms include helplessness, hopelessness, irritable, restlessness and sad.  Associated symptoms include no fatigue.  Past treatments include SNRIs - Serotonin and norepinephrine reuptake inhibitors.  Past medical history includes thyroid problem and anxiety.   Thyroid Problem Presents for follow-up visit. Symptoms include anxiety, depressed mood and dry skin. Patient reports no fatigue or hoarse voice. The symptoms have been stable.  Gastroesophageal Reflux She complains of belching and heartburn. She reports no hoarse voice. This is a chronic problem. The current episode started more than 1 year ago. The problem occurs occasionally. Pertinent negatives include no fatigue. Risk factors include obesity. She has tried a PPI for the symptoms. The treatment provided moderate relief.  Hypertension This is a chronic problem. The current episode started more than 1 year ago. The problem has been resolved since onset. The problem is controlled. Associated symptoms include anxiety and peripheral edema (some times). Pertinent negatives include no malaise/fatigue or shortness of breath. Identifiable causes of hypertension include a thyroid problem.     Review of Systems  Constitutional:  Positive for irritability.  Negative for fatigue and malaise/fatigue.  HENT:  Negative for hoarse voice.   Respiratory:  Negative for shortness of breath.   Gastrointestinal:  Positive for heartburn.  Psychiatric/Behavioral:  Positive for depression. The patient is nervous/anxious.   All other systems reviewed and are negative.     Objective:   Physical Exam Vitals reviewed.  Constitutional:      General: She is irritable. She is not in acute distress.    Appearance: She is well-developed. She is obese.  HENT:     Head: Normocephalic and atraumatic.     Right Ear: Tympanic membrane normal.     Left Ear: Tympanic membrane normal.  Eyes:     Pupils: Pupils are equal, round, and reactive to light.  Neck:     Thyroid: No thyromegaly.  Cardiovascular:     Rate and Rhythm: Normal rate and regular rhythm.     Heart sounds: Normal heart sounds. No murmur heard. Pulmonary:     Effort: Pulmonary effort is normal. No respiratory distress.     Breath sounds: Normal breath sounds. No wheezing.  Abdominal:     General: Bowel sounds are normal. There is no distension.     Palpations: Abdomen is soft.     Tenderness: There is no abdominal tenderness.  Musculoskeletal:        General: No tenderness. Normal range of motion.     Cervical back: Normal range of motion and neck supple.  Skin:    General: Skin is warm and dry.  Neurological:     Mental Status: She is alert and oriented to person, place, and time.     Cranial Nerves: No cranial nerve deficit.  Deep Tendon Reflexes: Reflexes are normal and symmetric.  Psychiatric:        Behavior: Behavior normal.        Thought Content: Thought content normal.        Judgment: Judgment normal.         BP 125/73   Pulse 64   Temp 97.9 F (36.6 C) (Temporal)   Ht 5' 3"  (1.6 m)   Wt 295 lb 3.2 oz (133.9 kg)   BMI 52.29 kg/m   Assessment & Plan:  Jacqueline Peterson comes in today with chief complaint of Medical Management of Chronic Issues   Diagnosis  and orders addressed:  1. Primary hypertension - CMP14+EGFR - CBC with Differential/Platelet  2. Hypothyroidism, unspecified type - CMP14+EGFR - CBC with Differential/Platelet - TSH  3. Morbid obesity ([place]) - CMP14+EGFR - CBC with Differential/Platelet  4. Anxiety - CMP14+EGFR - CBC with Differential/Platelet  5. Depression, major, single episode, moderate ([place]) - CMP14+EGFR - CBC with Differential/Platelet   Labs pending Health Maintenance reviewed Diet and exercise encouraged  Follow up plan: 6 months    Evelina Dun, FNP

## 2021-10-06 NOTE — Patient Instructions (Signed)

## 2021-10-07 ENCOUNTER — Other Ambulatory Visit: Payer: Self-pay | Admitting: Family

## 2021-10-07 LAB — CBC WITH DIFFERENTIAL/PLATELET
Basophils Absolute: 0.1 10*3/uL (ref 0.0–0.2)
Basos: 1 %
EOS (ABSOLUTE): 0.1 10*3/uL (ref 0.0–0.4)
Eos: 2 %
Hematocrit: 41.6 % (ref 34.0–46.6)
Hemoglobin: 13.5 g/dL (ref 11.1–15.9)
Immature Grans (Abs): 0 10*3/uL (ref 0.0–0.1)
Immature Granulocytes: 0 %
Lymphocytes Absolute: 2.4 10*3/uL (ref 0.7–3.1)
Lymphs: 31 %
MCH: 28.8 pg (ref 26.6–33.0)
MCHC: 32.5 g/dL (ref 31.5–35.7)
MCV: 89 fL (ref 79–97)
Monocytes Absolute: 0.5 10*3/uL (ref 0.1–0.9)
Monocytes: 7 %
Neutrophils Absolute: 4.5 10*3/uL (ref 1.4–7.0)
Neutrophils: 59 %
Platelets: 315 10*3/uL (ref 150–450)
RBC: 4.68 x10E6/uL (ref 3.77–5.28)
RDW: 12.9 % (ref 11.7–15.4)
WBC: 7.6 10*3/uL (ref 3.4–10.8)

## 2021-10-07 LAB — CMP14+EGFR
ALT: 14 IU/L (ref 0–32)
AST: 14 IU/L (ref 0–40)
Albumin/Globulin Ratio: 1.8 (ref 1.2–2.2)
Albumin: 4.3 g/dL (ref 3.8–4.8)
Alkaline Phosphatase: 83 IU/L (ref 44–121)
BUN/Creatinine Ratio: 13 (ref 9–23)
BUN: 11 mg/dL (ref 6–20)
Bilirubin Total: 0.3 mg/dL (ref 0.0–1.2)
CO2: 23 mmol/L (ref 20–29)
Calcium: 9.4 mg/dL (ref 8.7–10.2)
Chloride: 103 mmol/L (ref 96–106)
Creatinine, Ser: 0.84 mg/dL (ref 0.57–1.00)
Globulin, Total: 2.4 g/dL (ref 1.5–4.5)
Glucose: 89 mg/dL (ref 70–99)
Potassium: 4.9 mmol/L (ref 3.5–5.2)
Sodium: 137 mmol/L (ref 134–144)
Total Protein: 6.7 g/dL (ref 6.0–8.5)
eGFR: 92 mL/min/{1.73_m2} (ref >59)

## 2021-10-07 LAB — TSH: TSH: 9.02 u[IU]/mL — ABNORMAL HIGH (ref 0.450–4.500)

## 2021-10-07 MED ORDER — LEVOTHYROXINE SODIUM 112 MCG PO TABS: 112.0000 ug | ORAL_TABLET | Freq: Every day | ORAL | 1 refills | Status: DC

## 2021-10-16 ENCOUNTER — Ambulatory Visit: Payer: 59 | Admitting: Family

## 2021-10-17 ENCOUNTER — Ambulatory Visit: Payer: 59 | Admitting: Podiatry

## 2021-10-17 ENCOUNTER — Encounter: Payer: Self-pay | Admitting: Podiatry

## 2021-10-17 ENCOUNTER — Other Ambulatory Visit: Payer: Self-pay

## 2021-10-17 ENCOUNTER — Ambulatory Visit (INDEPENDENT_AMBULATORY_CARE_PROVIDER_SITE_OTHER): Payer: 59

## 2021-10-17 DIAGNOSIS — M7751 Other enthesopathy of right foot: Secondary | ICD-10-CM

## 2021-10-17 DIAGNOSIS — M21961 Unspecified acquired deformity of right lower leg: Secondary | ICD-10-CM | POA: Diagnosis not present

## 2021-10-17 NOTE — Progress Notes (Signed)
  Subjective:  Patient ID: Jacqueline Peterson, female    DOB: 1986/02/26,  MRN: [id number]  Chief Complaint  Patient presents with   Foot Pain    right foot corn on the bottom of foot-interested in sx/pf Right foot    35 y.o. female presents with the above complaint. History confirmed with patient. States the right foot lesion is causing pain. Has had it shaved down before. Has tried self care without relief.  Objective:  Physical Exam: warm, good capillary refill, no trophic changes or ulcerative lesions, normal DP and PT pulses, and normal sensory exam. Right Foot: POP plantar d MPJ with large punctate keratosis. No verrucous appearance.  No images are attached to the encounter.  Radiographs: X-ray of the right foot: no fracture, dislocation, swelling or degenerative changes noted and elongated second and third metatarsal. Lesion marker under 3rd metatarsal head. Assessment:   1. Deformity of metatarsal bone of right foot   2. Capsulitis of metatarsophalangeal (MTP) joint of right foot    Plan:  Patient was evaluated and treated and all questions answered.  Capsulitis MPJ right -Educated on etiology -XR reviewed with patient -Discussed surgical option with patient as she has failed conservative therapy of debridement, shoe gear changes, padding. She would like to proceed with surgery. Given submet 3 lesion will shorten and raise the 3rd metatarsal; will likely need concomitant 2nd MPJ shortening to prevent metatarsalgia. -Patient has failed conservative therapy and wishes to proceed with surgical intervention. All risks, benefits, and alternatives discussed with patient. No guarantees given. Consent reviewed and signed by patient. -Planned procedures: right 2nd/3rd metatarsal shortening osteotomy -Post-op anticoagulation: chemoprophylaxis not indicated -DME dispensed for post-op use: CAM Boot    No follow-ups on file.

## 2021-10-20 ENCOUNTER — Encounter: Payer: 59 | Admitting: Family

## 2021-10-20 ENCOUNTER — Encounter: Payer: Self-pay | Admitting: Family

## 2021-10-20 ENCOUNTER — Telehealth: Payer: Self-pay | Admitting: Urology

## 2021-10-20 NOTE — Telephone Encounter (Signed)
DOS - 10/29/21  METATARSAL OSTEOTOMY 2,3 RIGHT --- 28308   [facility] EFFECTIVE DATE - 06/17/2019   PER [employer] WEBSITE FOR CPT CODE 24199 X'S 2 Notification or Prior Authorization is not required for the requested services  You are not required to submit a notification/prior authorization based on the information provided. The number above acknowledges your inquiry and our response. Please write this number down and refer to it for future inquiries. If you still wish to submit your request for review, please select the Continue with Submission button below.  Decision ID #:[id number]

## 2021-10-26 ENCOUNTER — Other Ambulatory Visit: Payer: Self-pay | Admitting: Family

## 2021-10-26 DIAGNOSIS — I10 Essential (primary) hypertension: Secondary | ICD-10-CM

## 2021-10-29 ENCOUNTER — Encounter: Payer: Self-pay | Admitting: Podiatry

## 2021-10-29 ENCOUNTER — Other Ambulatory Visit: Payer: Self-pay | Admitting: Podiatry

## 2021-10-29 DIAGNOSIS — M21541 Acquired clubfoot, right foot: Secondary | ICD-10-CM | POA: Diagnosis not present

## 2021-10-29 MED ORDER — ONDANSETRON HCL 8 MG PO TABS: 8.0000 mg | ORAL_TABLET | Freq: Three times a day (TID) | ORAL | 0 refills | Status: DC | PRN

## 2021-10-29 MED ORDER — OXYCODONE-ACETAMINOPHEN 5-325 MG PO TABS: 1.0000 | ORAL_TABLET | ORAL | 0 refills | Status: DC | PRN

## 2021-10-29 MED ORDER — CEPHALEXIN 500 MG PO CAPS: ORAL_CAPSULE | ORAL | 0 refills | Status: DC

## 2021-10-29 NOTE — Progress Notes (Signed)
Rx sent to pharmacy for outpatient surgery. °

## 2021-10-30 NOTE — Telephone Encounter (Signed)
Please advise 

## 2021-11-04 ENCOUNTER — Ambulatory Visit (INDEPENDENT_AMBULATORY_CARE_PROVIDER_SITE_OTHER): Payer: 59

## 2021-11-04 ENCOUNTER — Encounter: Payer: Self-pay | Admitting: Podiatry

## 2021-11-04 ENCOUNTER — Other Ambulatory Visit: Payer: Self-pay

## 2021-11-04 ENCOUNTER — Ambulatory Visit (INDEPENDENT_AMBULATORY_CARE_PROVIDER_SITE_OTHER): Payer: 59 | Admitting: Podiatry

## 2021-11-04 DIAGNOSIS — M21961 Unspecified acquired deformity of right lower leg: Secondary | ICD-10-CM

## 2021-11-04 DIAGNOSIS — Z9889 Other specified postprocedural states: Secondary | ICD-10-CM

## 2021-11-04 MED ORDER — OXYCODONE-ACETAMINOPHEN 5-325 MG PO TABS: 1.0000 | ORAL_TABLET | ORAL | 0 refills | Status: DC | PRN

## 2021-11-04 NOTE — Progress Notes (Signed)
°  Subjective:  Patient ID: Jacqueline Peterson, female    DOB: [birth year],  MRN: [id number]  Chief Complaint  Patient presents with   Routine Post Op    POV #1 DOS 10/29/2021 -RT FOOT 2,3 METATARSAL SHORTENING OSTEOTOMY   "Its been pretty sore"   DOS: 10/29/21 Procedure: Right 2nd/3rd weil osteotomy  35 y.o. female presents with the above complaint. History confirmed with patient. Doing well had a bit of soreness and had to adjust the dressing. Had a little cramping as well. Pain well controlled now only having to take medication at nighttime.   Objective:  Physical Exam: tenderness at the surgical site, local edema noted, and calf supple, nontender. Incision: healing well, no significant drainage, no dehiscence, no significant erythema  No images are attached to the encounter.  Radiographs: X-ray of the right foot: consistent with post-op state, with good alignment and no evidence of hardware complication  Assessment:   1. Deformity of metatarsal bone of right foot   2. Status post right foot surgery     Plan:  Patient was evaluated and treated and all questions answered.  Post-operative State -XR reviewed with patient -Dressing applied consisting of sterile gauze, kerlix, and ACE bandage -WBAT in CAM boot -CAM boot dispensed -Pain medication refilled -XRs needed at follow-up: none   No follow-ups on file.

## 2021-11-12 ENCOUNTER — Other Ambulatory Visit: Payer: Self-pay | Admitting: Family

## 2021-11-12 DIAGNOSIS — E039 Hypothyroidism, unspecified: Secondary | ICD-10-CM

## 2021-11-14 ENCOUNTER — Ambulatory Visit: Payer: 59

## 2021-11-18 ENCOUNTER — Ambulatory Visit (INDEPENDENT_AMBULATORY_CARE_PROVIDER_SITE_OTHER): Payer: 59 | Admitting: Podiatry

## 2021-11-18 ENCOUNTER — Other Ambulatory Visit: Payer: Self-pay

## 2021-11-18 DIAGNOSIS — Z9889 Other specified postprocedural states: Secondary | ICD-10-CM

## 2021-11-18 DIAGNOSIS — M21961 Unspecified acquired deformity of right lower leg: Secondary | ICD-10-CM

## 2021-11-18 MED ORDER — OXYCODONE-ACETAMINOPHEN 5-325 MG PO TABS: 1.0000 | ORAL_TABLET | ORAL | 0 refills | Status: DC | PRN

## 2021-11-18 NOTE — Progress Notes (Signed)
°  Subjective:  Patient ID: Jacqueline Peterson, female    DOB: 11-13-1986,  MRN: [id number]  Chief Complaint  Patient presents with   Routine Post Op    Pov # 2 / patient states foot is better since the last visit    DOS: 10/29/21 Procedure: Right 2nd/3rd weil osteotomy  36 y.o. female presents with the above complaint. History confirmed with patient. States the foot is doing much better since last visit. Having pain when applying pressure, alleviated by rest. Pain level 0/10 today. Oral temp 99.3  Objective:  Physical Exam: tenderness at the surgical site, local edema noted, and calf supple, nontender. Incision: healing well, no significant drainage, no dehiscence, no significant erythema  No images are attached to the encounter.  Radiographs: X-ray of the right foot: not indicated  Assessment:   1. Status post right foot surgery   2. Deformity of metatarsal bone of right foot   3. Post-operative state    Plan:  Patient was evaluated and treated and all questions answered.  Post-operative State -Sutures removed -Steri-strips applied to the incision -Ok to start showering at this time. Advised they cannot soak. -WBAT in CAM boot -XRs needed at follow-up: none   Return in about 2 weeks (around 12/02/2021) for Post-Op (with XRs).

## 2021-12-05 ENCOUNTER — Ambulatory Visit (INDEPENDENT_AMBULATORY_CARE_PROVIDER_SITE_OTHER): Payer: 59

## 2021-12-05 ENCOUNTER — Other Ambulatory Visit: Payer: Self-pay

## 2021-12-05 ENCOUNTER — Ambulatory Visit (INDEPENDENT_AMBULATORY_CARE_PROVIDER_SITE_OTHER): Payer: 59 | Admitting: Podiatry

## 2021-12-05 DIAGNOSIS — Z9889 Other specified postprocedural states: Secondary | ICD-10-CM

## 2021-12-05 DIAGNOSIS — R52 Pain, unspecified: Secondary | ICD-10-CM

## 2021-12-05 NOTE — Progress Notes (Signed)
°  Subjective:  Patient ID: Jacqueline Peterson, female    DOB: 1985-12-09,  MRN: [id number]  No chief complaint on file.  DOS: 10/29/21 Procedure: Right 2nd/3rd weil osteotomy  36 y.o. female presents with the above complaint. History confirmed with patient. States she got tired of the boot and stopped wearing it earlier in the week. No pain today, no pain when wearing her normal shoegear.   Objective:  Physical Exam: only mild tenderness at the surgical site and no edema noted. Toes rectus. Incision: well healed No images are attached to the encounter.  Radiographs: X-ray of the right foot: consistent with post-op state, with good alignment and no evidence of hardware complication and not indicated. Osteotomies appear healed.  Assessment:   1. Status post right foot surgery   2. Pain    Plan:  Patient was evaluated and treated and all questions answered.  Post-operative State -She has transitioned on her own to normal shoegear, tolerating well without issues. I discussed that I would prefer some protection for the next 2 weeks - and thus will dispense surgical shoe. Her bone does appear healed, however. Continue surgical shoe for 2 weeks, and then f/u in 6 weeks to ensure full healing. -XRs needed at follow-up: 3 view Foot   Return in about 6 weeks (around 01/16/2022) for Post-Op (with XRs).

## 2021-12-25 ENCOUNTER — Ambulatory Visit (INDEPENDENT_AMBULATORY_CARE_PROVIDER_SITE_OTHER): Payer: 59 | Admitting: Nurse Practitioner

## 2021-12-25 ENCOUNTER — Encounter: Payer: Self-pay | Admitting: Nurse Practitioner

## 2021-12-25 DIAGNOSIS — R300 Dysuria: Secondary | ICD-10-CM

## 2021-12-25 DIAGNOSIS — N3000 Acute cystitis without hematuria: Secondary | ICD-10-CM

## 2021-12-25 LAB — URINALYSIS, COMPLETE
Bilirubin, UA: NEGATIVE
Glucose, UA: NEGATIVE
Ketones, UA: NEGATIVE
Nitrite, UA: NEGATIVE
Protein,UA: NEGATIVE
Specific Gravity, UA: 1.025 (ref 1.005–1.030)
Urobilinogen, Ur: 0.2 mg/dL (ref 0.2–1.0)
pH, UA: 6.5 (ref 5.0–7.5)

## 2021-12-25 LAB — MICROSCOPIC EXAMINATION
RBC, Urine: NONE SEEN /hpf (ref 0–2)
Renal Epithel, UA: NONE SEEN /hpf
WBC, UA: >30 /hpf — AB (ref 0–5)

## 2021-12-25 MED ORDER — FLUCONAZOLE 150 MG PO TABS: 150.0000 mg | ORAL_TABLET | Freq: Once | ORAL | 0 refills | Status: AC

## 2021-12-25 MED ORDER — SULFAMETHOXAZOLE-TRIMETHOPRIM 800-160 MG PO TABS: 1.0000 | ORAL_TABLET | Freq: Two times a day (BID) | ORAL | 0 refills | Status: DC

## 2021-12-25 NOTE — Patient Instructions (Signed)

## 2021-12-25 NOTE — Progress Notes (Signed)
° °  Virtual Visit  Note Due to COVID-19 pandemic this visit was conducted virtually. This visit type was conducted due to national recommendations for restrictions regarding the COVID-19 Pandemic (e.g. social distancing, sheltering in place) in an effort to limit this patient's exposure and mitigate transmission in our community. All issues noted in this document were discussed and addressed.  A physical exam was not performed with this format.  I connected with Jacqueline Peterson on 12/25/21 at 9:33 by telephone and verified that I am speaking with the correct person using two identifiers. Jacqueline Peterson is currently located at work and no one is currently with her during visit. The provider, Mary-Margaret Hassell Done, FNP is located in their office at time of visit.  I discussed the limitations, risks, security and privacy concerns of performing an evaluation and management service by telephone and the availability of in person appointments. I also discussed with the patient that there may be a patient responsible charge related to this service. The patient expressed understanding and agreed to proceed.   History and Present Illness:  Dysuria  This is a new problem. The current episode started today. The problem occurs every urination. The problem has been gradually worsening. The quality of the pain is described as burning. The pain is at a severity of 7/10. The pain is moderate. There has been no fever. She is Sexually active. There is A history of pyelonephritis. Associated symptoms include frequency and urgency. Pertinent negatives include no discharge, flank pain or hematuria. She has tried nothing for the symptoms.     Review of Systems  Genitourinary:  Positive for dysuria, frequency and urgency. Negative for flank pain and hematuria.    Observations/Objective: Alert and oriented- answers all questions appropriately No distress   Assessment and Plan: Jacqueline Peterson in today  with chief complaint of Dysuria   1. Dysuria  - Urinalysis, Complete  2. Acute cystitis without hematuria Take medication as prescribe Cotton underwear Take shower not bath Cranberry juice, yogurt Force fluids AZO over the counter X2 days Culture pending RTO prn  - sulfamethoxazole-trimethoprim (BACTRIM DS) 800-160 MG tablet; Take 1 tablet by mouth 2 (two) times daily.  Dispense: 14 tablet; Refill: 0 - fluconazole (DIFLUCAN) 150 MG tablet; Take 1 tablet (150 mg total) by mouth once for 1 dose.  Dispense: 1 tablet; Refill: 0    Follow Up Instructions: prn    I discussed the assessment and treatment plan with the patient. The patient was provided an opportunity to ask questions and all were answered. The patient agreed with the plan and demonstrated an understanding of the instructions.   The patient was advised to call back or seek an in-person evaluation if the symptoms worsen or if the condition fails to improve as anticipated.  The above assessment and management plan was discussed with the patient. The patient verbalized understanding of and has agreed to the management plan. Patient is aware to call the clinic if symptoms persist or worsen. Patient is aware when to return to the clinic for a follow-up visit. Patient educated on when it is appropriate to go to the emergency department.   Time call ended:  9:45  I provided 12 minutes of  non face-to-face time during this encounter.    Mary-Margaret Hassell Done, FNP

## 2021-12-25 NOTE — Addendum Note (Signed)
Addended by: Rolena Infante on: 12/25/2021 10:56 AM   Modules accepted: Orders

## 2021-12-29 LAB — URINE CULTURE

## 2022-01-27 ENCOUNTER — Ambulatory Visit: Payer: 59 | Admitting: Physician Assistant

## 2022-01-27 ENCOUNTER — Encounter: Payer: Self-pay | Admitting: Physician Assistant

## 2022-01-27 ENCOUNTER — Other Ambulatory Visit: Payer: Self-pay

## 2022-01-27 DIAGNOSIS — L738 Other specified follicular disorders: Secondary | ICD-10-CM

## 2022-01-27 DIAGNOSIS — Z1283 Encounter for screening for malignant neoplasm of skin: Secondary | ICD-10-CM

## 2022-01-27 DIAGNOSIS — Z808 Family history of malignant neoplasm of other organs or systems: Secondary | ICD-10-CM | POA: Diagnosis not present

## 2022-01-27 NOTE — Progress Notes (Signed)
? ?  Follow-Up Visit ?  ?Subjective  ?Jacqueline Peterson is a 36 y.o. female who presents for the following: Annual Exam (Lesion on right cheek- x months- comes and goes- "weird looking". Family history of non moe skin cancers but no melanoma. No personal history of melanoma or non mole skin cancers. ). Lesion on her left buttock- compare to photo.  ? ? ?The following portions of the chart were reviewed this encounter and updated as appropriate:  Tobacco  Allergies  Meds  Problems  Med Hx  Surg Hx  Fam Hx   ?  ? ?Objective  ?Well appearing patient in no apparent distress; mood and affect are within normal limits. ? ?A full examination was performed including scalp, head, eyes, ears, nose, lips, neck, chest, axillae, abdomen, back, buttocks, bilateral upper extremities, bilateral lower extremities, hands, feet, fingers, toes, fingernails, and toenails. All findings within normal limits unless otherwise noted below. ? ?No atypical nevi or signs of NMSC noted at the time of the visit.  ? ?Right Malar Cheek ?White lobulated papule with central dell. ? ? ?Assessment & Plan  ?Encounter for screening for malignant neoplasm of skin ? ?Yearly skin examination ? ?Sebaceous hyperplasia of face ?Right Malar Cheek ? ?observe ? ? ?No atypical nevi noted at the time of the visit. ? ?Nevi on left buttock-no apparent change.  ? ?I, Gaige Fussner, PA-C, have reviewed all documentation's for this visit.  The documentation on 01/27/22 for the exam, diagnosis, procedures and orders are all accurate and complete. ?

## 2022-02-28 ENCOUNTER — Other Ambulatory Visit: Payer: Self-pay | Admitting: Family

## 2022-02-28 DIAGNOSIS — F339 Major depressive disorder, recurrent, unspecified: Secondary | ICD-10-CM

## 2022-02-28 DIAGNOSIS — F411 Generalized anxiety disorder: Secondary | ICD-10-CM

## 2022-03-10 ENCOUNTER — Other Ambulatory Visit: Payer: Self-pay | Admitting: Orthopedic Surgery

## 2022-04-06 ENCOUNTER — Ambulatory Visit (INDEPENDENT_AMBULATORY_CARE_PROVIDER_SITE_OTHER): Payer: 59 | Admitting: Family

## 2022-04-06 ENCOUNTER — Encounter: Payer: Self-pay | Admitting: Family

## 2022-04-06 VITALS — BP 102/70 | HR 78 | Temp 98.7°F | Ht 63.0 in | Wt 298.0 lb

## 2022-04-06 DIAGNOSIS — G4700 Insomnia, unspecified: Secondary | ICD-10-CM

## 2022-04-06 DIAGNOSIS — K219 Gastro-esophageal reflux disease without esophagitis: Secondary | ICD-10-CM

## 2022-04-06 DIAGNOSIS — F321 Major depressive disorder, single episode, moderate: Secondary | ICD-10-CM

## 2022-04-06 DIAGNOSIS — Z0001 Encounter for general adult medical examination with abnormal findings: Secondary | ICD-10-CM

## 2022-04-06 DIAGNOSIS — E039 Hypothyroidism, unspecified: Secondary | ICD-10-CM | POA: Diagnosis not present

## 2022-04-06 DIAGNOSIS — I10 Essential (primary) hypertension: Secondary | ICD-10-CM | POA: Diagnosis not present

## 2022-04-06 DIAGNOSIS — Z0000 Encounter for general adult medical examination without abnormal findings: Secondary | ICD-10-CM

## 2022-04-06 DIAGNOSIS — F419 Anxiety disorder, unspecified: Secondary | ICD-10-CM

## 2022-04-06 NOTE — Progress Notes (Signed)
Subjective:    Patient ID: Jacqueline Peterson, female    DOB: 1985/12/19, 36 y.o.   MRN: [id number]  Chief Complaint  Patient presents with   Medical Management of Chronic Issues   PT presents to the office today for CPE without pap. She is followed by bariatric clinic. She is followed by Ortho for ganglion cyst.  Hypertension This is a chronic problem. The current episode started more than 1 year ago. The problem has been resolved since onset. Associated symptoms include anxiety and malaise/fatigue. Pertinent negatives include no peripheral edema or shortness of breath. Risk factors for coronary artery disease include dyslipidemia, obesity and sedentary lifestyle. The current treatment provides moderate improvement. Identifiable causes of hypertension include a thyroid problem.  Gastroesophageal Reflux She complains of belching and heartburn. She reports no hoarse voice. This is a chronic problem. The problem occurs occasionally. Associated symptoms include fatigue. She has tried a PPI for the symptoms. The treatment provided moderate relief.  Thyroid Problem Presents for follow-up visit. Symptoms include anxiety, depressed mood and fatigue. Patient reports no constipation, diarrhea or hoarse voice. The symptoms have been stable.  Insomnia Primary symptoms: difficulty falling asleep, frequent awakening, malaise/fatigue.   The current episode started more than one year. The onset quality is gradual. PMH includes: depression.   Depression        This is a chronic problem.  The current episode started more than 1 year ago.   Associated symptoms include fatigue and insomnia.  Associated symptoms include no helplessness, no hopelessness, not irritable, no restlessness and not sad.  Past treatments include SNRIs - Serotonin and norepinephrine reuptake inhibitors.  Past medical history includes thyroid problem and anxiety.   Anxiety Presents for follow-up visit. Symptoms include depressed mood,  excessive worry, insomnia, irritability and nervous/anxious behavior. Patient reports no restlessness or shortness of breath.       Review of Systems  Constitutional:  Positive for fatigue, irritability and malaise/fatigue.  HENT:  Negative for hoarse voice.   Respiratory:  Negative for shortness of breath.   Gastrointestinal:  Positive for heartburn. Negative for constipation and diarrhea.  Psychiatric/Behavioral:  Positive for depression. The patient is nervous/anxious and has insomnia.   All other systems reviewed and are negative.     Objective:   Physical Exam Vitals reviewed.  Constitutional:      General: She is not irritable.She is not in acute distress.    Appearance: She is well-developed. She is obese.  HENT:     Head: Normocephalic and atraumatic.     Right Ear: Tympanic membrane normal.     Left Ear: Tympanic membrane normal.  Eyes:     Pupils: Pupils are equal, round, and reactive to light.  Neck:     Thyroid: No thyromegaly.  Cardiovascular:     Rate and Rhythm: Normal rate and regular rhythm.     Heart sounds: Normal heart sounds. No murmur heard. Pulmonary:     Effort: Pulmonary effort is normal. No respiratory distress.     Breath sounds: Normal breath sounds. No wheezing.  Abdominal:     General: Bowel sounds are normal. There is no distension.     Palpations: Abdomen is soft.     Tenderness: There is no abdominal tenderness.  Musculoskeletal:        General: No tenderness. Normal range of motion.     Cervical back: Normal range of motion and neck supple.  Skin:    General: Skin is warm and dry.  Neurological:  Mental Status: She is alert and oriented to person, place, and time.     Cranial Nerves: No cranial nerve deficit.     Deep Tendon Reflexes: Reflexes are normal and symmetric.  Psychiatric:        Behavior: Behavior normal.        Thought Content: Thought content normal.        Judgment: Judgment normal.    BP 102/70   Pulse 78    Temp 98.7 F (37.1 C)   Ht _0  (1.6 m)   Wt 298 lb (135.2 kg)   LMP 03/18/2022   SpO2 98%   BMI 52.79 kg/m      Assessment & Plan:  PERLA ECHAVARRIA comes in today with chief complaint of Medical Management of Chronic Issues   Diagnosis and orders addressed:  1. Primary hypertension - CMP14+EGFR - CBC with Differential/Platelet  2. Gastroesophageal reflux disease, unspecified whether esophagitis present - CMP14+EGFR - CBC with Differential/Platelet  3. Hypothyroidism, unspecified type - CMP14+EGFR - CBC with Differential/Platelet  4. Morbid obesity ([place]) - CMP14+EGFR - CBC with Differential/Platelet  5. Insomnia, unspecified type - CMP14+EGFR - CBC with Differential/Platelet  6. Depression, major, single episode, moderate (HCC) - CMP14+EGFR - CBC with Differential/Platelet  7. Anxiety - CMP14+EGFR - CBC with Differential/Platelet  8. Annual physical exam - CMP14+EGFR - CBC with Differential/Platelet - Lipid panel - TSH   Labs pending Health Maintenance reviewed Diet and exercise encouraged  Follow up plan: 6 months    Evelina Dun, FNP

## 2022-04-06 NOTE — Patient Instructions (Signed)

## 2022-04-07 LAB — CBC WITH DIFFERENTIAL/PLATELET
Basophils Absolute: 0.1 10*3/uL (ref 0.0–0.2)
Basos: 1 %
EOS (ABSOLUTE): 0.1 10*3/uL (ref 0.0–0.4)
Eos: 1 %
Hematocrit: 39.9 % (ref 34.0–46.6)
Hemoglobin: 13.6 g/dL (ref 11.1–15.9)
Immature Grans (Abs): 0 10*3/uL (ref 0.0–0.1)
Immature Granulocytes: 0 %
Lymphocytes Absolute: 2.5 10*3/uL (ref 0.7–3.1)
Lymphs: 32 %
MCH: 29.2 pg (ref 26.6–33.0)
MCHC: 34.1 g/dL (ref 31.5–35.7)
MCV: 86 fL (ref 79–97)
Monocytes Absolute: 0.6 10*3/uL (ref 0.1–0.9)
Monocytes: 8 %
Neutrophils Absolute: 4.7 10*3/uL (ref 1.4–7.0)
Neutrophils: 58 %
Platelets: 296 10*3/uL (ref 150–450)
RBC: 4.66 x10E6/uL (ref 3.77–5.28)
RDW: 12.9 % (ref 11.7–15.4)
WBC: 8 10*3/uL (ref 3.4–10.8)

## 2022-04-07 LAB — CMP14+EGFR
ALT: 24 IU/L (ref 0–32)
AST: 21 IU/L (ref 0–40)
Albumin/Globulin Ratio: 1.8 (ref 1.2–2.2)
Albumin: 4.3 g/dL (ref 3.8–4.8)
Alkaline Phosphatase: 89 IU/L (ref 44–121)
BUN/Creatinine Ratio: 11 (ref 9–23)
BUN: 10 mg/dL (ref 6–20)
Bilirubin Total: 0.3 mg/dL (ref 0.0–1.2)
CO2: 22 mmol/L (ref 20–29)
Calcium: 8.7 mg/dL (ref 8.7–10.2)
Chloride: 102 mmol/L (ref 96–106)
Creatinine, Ser: 0.88 mg/dL (ref 0.57–1.00)
Globulin, Total: 2.4 g/dL (ref 1.5–4.5)
Glucose: 99 mg/dL (ref 70–99)
Potassium: 4.8 mmol/L (ref 3.5–5.2)
Sodium: 138 mmol/L (ref 134–144)
Total Protein: 6.7 g/dL (ref 6.0–8.5)
eGFR: 87 mL/min/{1.73_m2} (ref >59)

## 2022-04-07 LAB — LIPID PANEL
Chol/HDL Ratio: 5.7 ratio — ABNORMAL HIGH (ref 0.0–4.4)
Cholesterol, Total: 234 mg/dL — ABNORMAL HIGH (ref 100–199)
HDL: 41 mg/dL (ref >39)
LDL Chol Calc (NIH): 158 mg/dL — ABNORMAL HIGH (ref 0–99)
Triglycerides: 190 mg/dL — ABNORMAL HIGH (ref 0–149)
VLDL Cholesterol Cal: 35 mg/dL (ref 5–40)

## 2022-04-07 LAB — TSH: TSH: 1.76 u[IU]/mL (ref 0.450–4.500)

## 2022-04-08 ENCOUNTER — Other Ambulatory Visit: Payer: Self-pay

## 2022-04-08 ENCOUNTER — Encounter (HOSPITAL_COMMUNITY): Payer: Self-pay | Admitting: Orthopedic Surgery

## 2022-04-08 NOTE — Progress Notes (Signed)
PCP - Evelina Dun, FNP Cardiologist -  EKG - DOS Chest x-ray -  ECHO -  Cardiac Cath -  CPAP -   ERAS Protcol - 0430 COVID TEST- n/a  Anesthesia review: n/a  -------------  SDW INSTRUCTIONS:  Your procedure is scheduled on Friday 5/26. Please report to [facility] Main Entrance "A" at 0530 A.M., and check in at the Admitting office. Call this number if you have problems the morning of surgery: [phone]   Remember: Do not eat after midnight the night before your surgery  You may drink clear liquids until 04:30 AM the morning of your surgery.   Clear liquids allowed are: Water, Non-Citrus Juices (without pulp), Carbonated Beverages, Clear Tea, Black Coffee Only, and Gatorade   Medications to take morning of surgery with a sip of water include: acetaminophen (TYLENOL)  if needed cetirizine (ZYRTEC) if needed labetalol (NORMODYNE)  levothyroxine (SYNTHROID)  pantoprazole (PROTONIX)    As of today, STOP taking any Aspirin (unless otherwise instructed by your surgeon), Aleve, Naproxen, Ibuprofen, Motrin, Advil, Goody's, BC's, all herbal medications, fish oil, and all vitamins.    The Morning of Surgery Do not wear jewelry, make-up or nail polish. Do not wear lotions, powders, or perfumes, or deodorant Do not bring valuables to the hospital. [facility] is not responsible for any belongings or valuables.  If you are a smoker, DO NOT Smoke 24 hours prior to surgery  If you wear a CPAP at night please bring your mask the morning of surgery   Remember that you must have someone to transport you home after your surgery, and remain with you for 24 hours if you are discharged the same day.  Please bring cases for contacts, glasses, hearing aids, dentures or bridgework because it cannot be worn into surgery.   Patients discharged the day of surgery will not be allowed to drive home.   Please shower the NIGHT BEFORE/MORNING OF SURGERY (use antibacterial soap like DIAL soap  if possible). Wear comfortable clothes the morning of surgery. Oral Hygiene is also important to reduce your risk of infection.  Remember - BRUSH YOUR TEETH THE MORNING OF SURGERY WITH YOUR REGULAR TOOTHPASTE  Patient denies shortness of breath, fever, cough and chest pain.

## 2022-04-09 NOTE — H&P (Signed)
History: CC / Reason for Visit: Right hand mass HPI: This patient returns to clinic today for reevaluation of her right dorsal hand mass.  She states that despite having it aspirated with ultrasound guidance back in August 2022, she still having issues and its painful.  She reports that weighted wrist extension continues to be uncomfortable.  She would like to move forward with right hand dorsal mass excision.    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  She states that it grows larger in size and then she drinks occasionally.  She states that it is very painful to perform weighted wrist extension.  It is not painful to palpate over the bump, but her wrist is generally achy.  She also notes that she does use over-the-counter NSAIDs which seem to help but does not necessarily make the bump go away.  She is here today for further evaluation and treatment.  There were no injuries or accidents that may have been a cause.  Review of systems as related to current complaint reviewed and unchanged.  Exam:  Vitals: Refer to EMR. Constitutional:  WD, WN, NAD HEENT:  NCAT, EOMI Neuro/Psych:  Alert & oriented to person, place, and time; appropriate mood & affect Lymphatic: No generalized UE edema or lymphadenopathy Extremities / MSK:  Both UE are normal with respect to appearance, ranges of motion, joint stability, muscle strength/tone, sensation, & perfusion except as otherwise noted:  There is a small firm bump in the dorsal central portion of the hand, over the third metacarpal base.  It is mildly tender to palpate.  There is tenderness also with weighted wrist extension.  The patient does have full digital motion.  She has full wrist range of motion and full supination and pronation.  NVI.  Labs / Xrays:  No new x-rays.  3 views of the right wrist ordered and obtained  today demonstrate no fractures, dislocations, or bony lesions.  The mass is not seen on x-ray and is not calcific. The third [facility] base demonstrates what may be a dorsal boss.  Assessment: Possible right dorsal hand ganglion cyst, with underlying dorsal boss  Plan:  The findings are discussed with the patient.  She would like to move forward with excision of the ganglion cyst as well as the dorsal boss on her right hand.The details of the operative procedure were discussed with the patient.  Questions were invited and answered.  In addition to the goal of the procedure, the risks of the procedure to include but not limited to bleeding; infection; damage to the nerves or blood vessels that could result in bleeding, numbness, weakness, chronic pain, and the need for additional procedures; stiffness; the need for revision surgery; and anesthetic risks were reviewed.  No specific outcome was guaranteed or implied.

## 2022-04-10 ENCOUNTER — Other Ambulatory Visit: Payer: Self-pay

## 2022-04-10 ENCOUNTER — Ambulatory Visit (HOSPITAL_BASED_OUTPATIENT_CLINIC_OR_DEPARTMENT_OTHER): Payer: 59 | Admitting: Anesthesiology

## 2022-04-10 ENCOUNTER — Encounter (HOSPITAL_COMMUNITY)
Admission: RE | Disposition: A | Discharge status: Home / Self Care | Payer: Self-pay | Source: Home / Self Care | Attending: Orthopedic Surgery

## 2022-04-10 ENCOUNTER — Ambulatory Visit (HOSPITAL_COMMUNITY): Payer: 59 | Admitting: Anesthesiology

## 2022-04-10 ENCOUNTER — Ambulatory Visit (HOSPITAL_COMMUNITY)
Admission: RE | Admit: 2022-04-10 | Discharge: 2022-04-10 | Disposition: A | Discharge status: Home / Self Care | Payer: 59 | Attending: Orthopedic Surgery | Admitting: Orthopedic Surgery

## 2022-04-10 ENCOUNTER — Encounter (HOSPITAL_COMMUNITY): Payer: Self-pay | Admitting: Orthopedic Surgery

## 2022-04-10 DIAGNOSIS — F32A Depression, unspecified: Secondary | ICD-10-CM | POA: Diagnosis not present

## 2022-04-10 DIAGNOSIS — E039 Hypothyroidism, unspecified: Secondary | ICD-10-CM | POA: Diagnosis not present

## 2022-04-10 DIAGNOSIS — F419 Anxiety disorder, unspecified: Secondary | ICD-10-CM | POA: Diagnosis not present

## 2022-04-10 DIAGNOSIS — Z6841 Body Mass Index (BMI) 40.0 and over, adult: Secondary | ICD-10-CM | POA: Insufficient documentation

## 2022-04-10 DIAGNOSIS — M25731 Osteophyte, right wrist: Secondary | ICD-10-CM | POA: Diagnosis not present

## 2022-04-10 DIAGNOSIS — F418 Other specified anxiety disorders: Secondary | ICD-10-CM

## 2022-04-10 DIAGNOSIS — I4510 Unspecified right bundle-branch block: Secondary | ICD-10-CM | POA: Insufficient documentation

## 2022-04-10 DIAGNOSIS — Z87891 Personal history of nicotine dependence: Secondary | ICD-10-CM | POA: Diagnosis not present

## 2022-04-10 DIAGNOSIS — I10 Essential (primary) hypertension: Secondary | ICD-10-CM | POA: Insufficient documentation

## 2022-04-10 DIAGNOSIS — Z79899 Other long term (current) drug therapy: Secondary | ICD-10-CM | POA: Diagnosis not present

## 2022-04-10 DIAGNOSIS — R2231 Localized swelling, mass and lump, right upper limb: Secondary | ICD-10-CM | POA: Diagnosis present

## 2022-04-10 DIAGNOSIS — I498 Other specified cardiac arrhythmias: Secondary | ICD-10-CM | POA: Insufficient documentation

## 2022-04-10 DIAGNOSIS — K219 Gastro-esophageal reflux disease without esophagitis: Secondary | ICD-10-CM | POA: Diagnosis not present

## 2022-04-10 HISTORY — PX: EXCISION METACARPAL MASS: SHX6372

## 2022-04-10 LAB — POCT PREGNANCY, URINE: Preg Test, Ur: NEGATIVE

## 2022-04-10 SURGERY — EXCISION METACARPAL MASS
Anesthesia: Monitor Anesthesia Care | Site: Hand | Laterality: Right

## 2022-04-10 MED ORDER — LIDOCAINE HCL (PF) 1 % IJ SOLN
INTRAMUSCULAR | Status: AC
  Filled 2022-04-10: qty 30

## 2022-04-10 MED ORDER — BUPIVACAINE-EPINEPHRINE 0.5% -1:200000 IJ SOLN
INTRAMUSCULAR | Status: AC
  Filled 2022-04-10: qty 1

## 2022-04-10 MED ORDER — FENTANYL CITRATE (PF) 250 MCG/5ML IJ SOLN
INTRAMUSCULAR | Status: AC
  Filled 2022-04-10: qty 5

## 2022-04-10 MED ORDER — LIDOCAINE HCL (PF) 1 % IJ SOLN
INTRAMUSCULAR | Status: DC | PRN
  Administered 2022-04-10: 9 mL

## 2022-04-10 MED ORDER — CEFAZOLIN IN SODIUM CHLORIDE 3-0.9 GM/100ML-% IV SOLN
3.0000 g | INTRAVENOUS | Status: AC
  Administered 2022-04-10: 3 g via INTRAVENOUS

## 2022-04-10 MED ORDER — ORAL CARE MOUTH RINSE: 15.0000 mL | Freq: Once | OROMUCOSAL | Status: AC

## 2022-04-10 MED ORDER — 0.9 % SODIUM CHLORIDE (POUR BTL) OPTIME
TOPICAL | Status: DC | PRN
  Administered 2022-04-10: 1000 mL

## 2022-04-10 MED ORDER — CLONIDINE HCL (ANALGESIA) 100 MCG/ML EP SOLN
EPIDURAL | Status: DC | PRN
  Administered 2022-04-10: 100 ug

## 2022-04-10 MED ORDER — CHLORHEXIDINE GLUCONATE 0.12 % MT SOLN
15.0000 mL | Freq: Once | OROMUCOSAL | Status: AC
  Administered 2022-04-10: 15 mL via OROMUCOSAL
  Filled 2022-04-10: qty 15

## 2022-04-10 MED ORDER — MIDAZOLAM HCL 2 MG/2ML IJ SOLN
INTRAMUSCULAR | Status: DC | PRN
  Administered 2022-04-10: 2 mg via INTRAVENOUS

## 2022-04-10 MED ORDER — LACTATED RINGERS IV SOLN: INTRAVENOUS | Status: DC

## 2022-04-10 MED ORDER — ROPIVACAINE HCL 5 MG/ML IJ SOLN
INTRAMUSCULAR | Status: DC | PRN
  Administered 2022-04-10: 25 mL via PERINEURAL

## 2022-04-10 MED ORDER — CEFAZOLIN IN SODIUM CHLORIDE 3-0.9 GM/100ML-% IV SOLN
INTRAVENOUS | Status: AC
  Filled 2022-04-10: qty 100

## 2022-04-10 MED ORDER — MIDAZOLAM HCL 2 MG/2ML IJ SOLN
INTRAMUSCULAR | Status: AC
  Filled 2022-04-10: qty 2

## 2022-04-10 MED ORDER — IBUPROFEN 200 MG PO TABS: 200.0000 mg | ORAL_TABLET | Freq: Four times a day (QID) | ORAL | 0 refills | Status: DC | PRN

## 2022-04-10 MED ORDER — OXYCODONE HCL 5 MG PO TABS
5.0000 mg | ORAL_TABLET | Freq: Four times a day (QID) | ORAL | 0 refills | Status: DC | PRN
Start: 2022-04-10 — End: 2022-06-05

## 2022-04-10 MED ORDER — PROPOFOL 500 MG/50ML IV EMUL
INTRAVENOUS | Status: DC | PRN
  Administered 2022-04-10: 75 ug/kg/min via INTRAVENOUS
  Administered 2022-04-10: 50 mg via INTRAVENOUS

## 2022-04-10 MED ORDER — FENTANYL CITRATE (PF) 100 MCG/2ML IJ SOLN
INTRAMUSCULAR | Status: DC | PRN
Start: 2022-04-10 — End: 2022-04-10

## 2022-04-10 MED ORDER — FENTANYL CITRATE (PF) 250 MCG/5ML IJ SOLN
INTRAMUSCULAR | Status: DC | PRN
  Administered 2022-04-10: 25 ug via INTRAVENOUS
  Administered 2022-04-10: 100 ug via INTRAVENOUS
  Administered 2022-04-10: 25 ug via INTRAVENOUS

## 2022-04-10 SURGICAL SUPPLY — 34 items
BAG COUNTER SPONGE SURGICOUNT (BAG) ×2 IMPLANT
BENZOIN TINCTURE PRP APPL 2/3 (GAUZE/BANDAGES/DRESSINGS) ×1 IMPLANT
BNDG GAUZE ELAST 4 BULKY (GAUZE/BANDAGES/DRESSINGS) ×1 IMPLANT
CHLORAPREP W/TINT 26 (MISCELLANEOUS) ×2 IMPLANT
CORD BIPOLAR FORCEPS 12FT (ELECTRODE) ×2 IMPLANT
COVER SURGICAL LIGHT HANDLE (MISCELLANEOUS) ×1 IMPLANT
CUFF TOURN SGL QUICK 18X4 (TOURNIQUET CUFF) ×1 IMPLANT
DRAPE SURG 17X23 STRL (DRAPES) ×2 IMPLANT
DRSG EMULSION OIL 3X3 NADH (GAUZE/BANDAGES/DRESSINGS) ×1 IMPLANT
GAUZE SPONGE 4X4 12PLY STRL (GAUZE/BANDAGES/DRESSINGS) ×1 IMPLANT
GLOVE BIO SURGEON STRL SZ7.5 (GLOVE) ×2 IMPLANT
GLOVE BIOGEL PI IND STRL 7.0 (GLOVE) ×1 IMPLANT
GLOVE BIOGEL PI IND STRL 8 (GLOVE) ×1 IMPLANT
GLOVE BIOGEL PI INDICATOR 7.0 (GLOVE) ×1
GLOVE BIOGEL PI INDICATOR 8 (GLOVE) ×1
GLOVE ECLIPSE 6.5 STRL STRAW (GLOVE) ×2 IMPLANT
GOWN STRL REUS W/ TWL LRG LVL3 (GOWN DISPOSABLE) ×2 IMPLANT
GOWN STRL REUS W/ TWL XL LVL3 (GOWN DISPOSABLE) ×1 IMPLANT
GOWN STRL REUS W/TWL LRG LVL3 (GOWN DISPOSABLE) ×3
GOWN STRL REUS W/TWL XL LVL3 (GOWN DISPOSABLE) ×1
KIT BASIN OR (CUSTOM PROCEDURE TRAY) ×2 IMPLANT
NDL HYPO 25X1 1.5 SAFETY (NEEDLE) IMPLANT
NEEDLE HYPO 25X1 1.5 SAFETY (NEEDLE) ×2 IMPLANT
NS IRRIG 1000ML POUR BTL (IV SOLUTION) ×2 IMPLANT
PACK ORTHO EXTREMITY (CUSTOM PROCEDURE TRAY) ×2 IMPLANT
PADDING CAST ABS 4INX4YD NS (CAST SUPPLIES)
PADDING CAST ABS COTTON 4X4 ST (CAST SUPPLIES) IMPLANT
PADDING CAST SYNTHETIC 4 (CAST SUPPLIES) ×1
PADDING CAST SYNTHETIC 4X4 STR (CAST SUPPLIES) IMPLANT
SLING ARM FOAM STRAP XLG (SOFTGOODS) ×1 IMPLANT
STRIP CLOSURE SKIN 1/2X4 (GAUZE/BANDAGES/DRESSINGS) ×1 IMPLANT
SUT VICRYL RAPIDE 4/0 PS 2 (SUTURE) ×1 IMPLANT
SYR 10ML LL (SYRINGE) ×1 IMPLANT
UNDERPAD 30X36 HEAVY ABSORB (UNDERPADS AND DIAPERS) ×2 IMPLANT

## 2022-04-10 NOTE — Anesthesia Preprocedure Evaluation (Addendum)
Anesthesia Evaluation  Patient identified by MRN, date of birth, ID band Patient awake    Reviewed: Allergy & Precautions, NPO status , Patient's Chart, lab work & pertinent test results  Airway Mallampati: II  TM Distance: >3 FB Neck ROM: Full    Dental no notable dental hx.    Pulmonary neg pulmonary ROS, former smoker,    Pulmonary exam normal        Cardiovascular hypertension, Pt. on medications and Pt. on home beta blockers  Rhythm:Regular Rate:Normal     Neuro/Psych Anxiety Depression negative neurological ROS     GI/Hepatic Neg liver ROS, GERD  Medicated,  Endo/Other  Hypothyroidism Morbid obesity  Renal/GU negative Renal ROS  negative genitourinary   Musculoskeletal negative musculoskeletal ROS (+)   Abdominal Normal abdominal exam  (+)   Peds  Hematology negative hematology ROS (+)   Anesthesia Other Findings   Reproductive/Obstetrics                            Anesthesia Physical Anesthesia Plan  ASA: 3  Anesthesia Plan: MAC and Regional   Post-op Pain Management: Regional block*   Induction: Intravenous  PONV Risk Score and Plan: 2 and Ondansetron, Dexamethasone, Propofol infusion, Midazolam and Treatment may vary due to age or medical condition  Airway Management Planned: Simple Face Mask, Natural Airway and Nasal Cannula  Additional Equipment: None  Intra-op Plan:   Post-operative Plan:   Informed Consent: I have reviewed the patients History and Physical, chart, labs and discussed the procedure including the risks, benefits and alternatives for the proposed anesthesia with the patient or authorized representative who has indicated his/her understanding and acceptance.     Dental advisory given  Plan Discussed with: CRNA  Anesthesia Plan Comments:         Anesthesia Quick Evaluation

## 2022-04-10 NOTE — Discharge Instructions (Addendum)
Discharge Instructions   You have a light dressing on your hand.  You may begin gentle motion of your fingers and hand immediately, but you should not do any heavy lifting or gripping.  Elevate your hand to reduce pain & swelling of the digits.  Ice over the operative site may be helpful to reduce pain & swelling.  DO NOT USE HEAT. Pain medicine has been prescribed for you.  Take Tylenol 650 mg and Ibuprofen 600 mg every 6 hours together. Take Oxycodone 5 mg as needed for severe post operative pain. Leave the dressing in place until the third day after your surgery and then remove it, leaving it open to air.  After the bandage has been removed you may shower, regularly washing the incision and letting the water run over it, but not submerging it (no swimming, soaking it in dishwater, etc.) You may drive a car when you are off of prescription pain medications and can safely control your vehicle with both hands. We will address whether therapy will be required or not when you return to the office. You may have already made your follow-up appointment when we completed your preop visit.  If not, please call our office today or the next business day to make your return appointment for 04/28/22.   Please call [phone] during normal business hours or [phone] after hours for any problems. Including the following:  - excessive redness of the incisions - drainage for more than 4 days - fever of more than 101.5 F  *Please note that pain medications will not be refilled after hours or on weekends.   WORK STATUS: You may return to work on Tuesday 04/14/22 with no lifting, gripping and grasping greater than pencil and paper tasks with the right hand.

## 2022-04-10 NOTE — Transfer of Care (Signed)
Immediate Anesthesia Transfer of Care Note  Patient: Jacqueline Peterson  Procedure(s) Performed: RIGHT HAND METACARPAL BOSS EXCISION (Right: Hand)  Patient Location: PACU  Anesthesia Type:MAC and Regional  Level of Consciousness: drowsy  Airway & Oxygen Therapy: Patient Spontanous Breathing  Post-op Assessment: Report given to RN and Post -op Vital signs reviewed and stable  Post vital signs: Reviewed and stable  Last Vitals:  Vitals Value Taken Time  BP    Temp    Pulse 79 04/10/22 0810  Resp 23 04/10/22 0810  SpO2 96 % 04/10/22 0810  Vitals shown include unvalidated device data.  Last Pain:  Vitals:   04/10/22 0612  TempSrc:   PainSc: 0-No pain         Complications: No notable events documented.

## 2022-04-10 NOTE — Anesthesia Postprocedure Evaluation (Signed)
Anesthesia Post Note  Patient: Jacqueline Peterson  Procedure(s) Performed: RIGHT HAND METACARPAL BOSS EXCISION (Right: Hand)     Patient location during evaluation: PACU Anesthesia Type: Regional and MAC Level of consciousness: awake and alert Pain management: pain level controlled Vital Signs Assessment: post-procedure vital signs reviewed and stable Respiratory status: spontaneous breathing, nonlabored ventilation, respiratory function stable and patient connected to nasal cannula oxygen Cardiovascular status: stable and blood pressure returned to baseline Postop Assessment: no apparent nausea or vomiting Anesthetic complications: no   No notable events documented.  Last Vitals:  Vitals:   04/10/22 0825 04/10/22 0840  BP: 125/65 138/71  Pulse: 71 64  Resp: 15 16  Temp:  37.1 C  SpO2: 95% 96%    Last Pain:  Vitals:   04/10/22 0840  TempSrc:   PainSc: 0-No pain                 Belenda Cruise P Jaquise Faux

## 2022-04-10 NOTE — Op Note (Signed)
04/10/2022  7:08 AM  PATIENT:  Jacqueline Peterson  36 y.o. female  PRE-OPERATIVE DIAGNOSIS: Symptomatic right carpal boss  POST-OPERATIVE DIAGNOSIS:  Same  PROCEDURE: Right carpal boss excision (partial excision of metacarpal and carpal bones)  SURGEON: Rayvon Char. Grandville Silos, MD  PHYSICIAN ASSISTANT: Eustace Quail, OPA-C  ANESTHESIA:  regional and MAC  SPECIMENS:  None  DRAINS:   None  EBL:  Minimal  PREOPERATIVE INDICATIONS:  [name] is a  36 y.o. female with a symptomatic right carpal boss.  At one point using ultrasound it was evaluated and there was an associated ganglion cyst.  It is failed nonoperative management.  The risks benefits and alternatives were discussed with the patient preoperatively including but not limited to the risks of infection, bleeding, nerve injury, cardiopulmonary complications, the need for revision surgery, among others, and the patient verbalized understanding and consented to proceed.  OPERATIVE IMPLANTS: None  OPERATIVE PROCEDURE:  After receiving prophylactic antibiotics and a regional block, the patient was escorted to the operative theatre and placed in a supine position.  A surgical "time-out" was performed during which the planned procedure, proposed operative site, and the correct patient identity were compared to the operative consent and agreement confirmed by the circulating nurse according to current facility policy.  Following application of a tourniquet to the operative extremity, the exposed skin was prepped with Chloraprep and draped in the usual sterile fashion.  The limb was exsanguinated with an Esmarch bandage and the tourniquet inflated to approximately 182mHg higher than systolic BP.  A linear longitudinal incision was made over the prominence of the bump.  Full-thickness flaps were elevated.  The extensor apparatus was reflected radially and ulnarly and the periosteum overlying the pump was incised longitudinally as  well.  This required some augmented anesthesia with lidocaine field block.  The bony bump was exposed in this fashion.  There was no cystic structure associated with it.  The bump was smoothed with a combination of osteotome and rondure.  [facility] able to be inspected, as part of the bump was from the base of the third metacarpal, part from the adjacent carpal bones.  The wound was irrigated, and the periosteum closed back over the smoothest contour.  It was reapproximated with 4-0 Vicryl Rapide interrupted suture.  The tourniquet was released additional hemostasis obtained with bipolar cautery and the skin was closed with 4-0 Vicryl Rapide running subcuticular suture with benzoin and Steri-Strips.  A bulky dressing was applied and she was taken to the recovery room in stable condition.  DISPOSITION: She will be discharged home with typical instructions, returning the week of the 12th for reevaluation.

## 2022-04-10 NOTE — Anesthesia Procedure Notes (Signed)
Anesthesia Regional Block: Supraclavicular block   Pre-Anesthetic Checklist: , timeout performed,  Correct Patient, Correct Site, Correct Laterality,  Correct Procedure, Correct Position, site marked,  Risks and benefits discussed,  Surgical consent,  Pre-op evaluation,  At surgeon's request and post-op pain management  Laterality: Right  Prep: Dura Prep       Needles:  Injection technique: Single-shot  Needle Type: Echogenic Stimulator Needle     Needle Length: 5cm  Needle Gauge: 20     Additional Needles:   Procedures:,,,, ultrasound used (permanent image in chart),,    Narrative:  Start time: 04/10/2022 7:00 AM End time: 04/10/2022 7:08 AM Injection made incrementally with aspirations every 5 mL.  Performed by: Personally  Anesthesiologist: Darral Dash, DO  Additional Notes: Patient identified. Risks/Benefits/Options discussed with patient including but not limited to bleeding, infection, nerve damage, failed block, incomplete pain control. Patient expressed understanding and wished to proceed. All questions were answered. Sterile technique was used throughout the entire procedure. Please see nursing notes for vital signs. Aspirated in 5cc intervals with injection for negative confirmation. Patient was given instructions on fall risk and not to get out of bed. All questions and concerns addressed with instructions to call with any issues or inadequate analgesia.

## 2022-04-10 NOTE — OR Nursing (Signed)
XL Arm sling sent to PACU with patient sticker attached to be applied once patient wakes up.

## 2022-04-10 NOTE — Interval H&P Note (Signed)
History and Physical Interval Note:  04/10/2022 7:07 AM  Jacqueline Peterson  has presented today for surgery, with the diagnosis of RIGHT HAND PAINFUL ENLARGEMENT.  The various methods of treatment have been discussed with the patient and family. After consideration of risks, benefits and other options for treatment, the patient has consented to  Procedure(s) with comments: RIGHT HAND METACARPAL BOSS EXCISION (Right) - PRE-OP BLOCK as a surgical intervention.  The patient's history has been reviewed, patient examined, no change in status, stable for surgery.  I have reviewed the patient's chart and labs.  Questions were answered to the patient's satisfaction.     Jolyn Nap

## 2022-04-11 ENCOUNTER — Encounter (HOSPITAL_COMMUNITY): Payer: Self-pay | Admitting: Orthopedic Surgery

## 2022-04-14 ENCOUNTER — Other Ambulatory Visit: Payer: Self-pay | Admitting: Family

## 2022-04-30 ENCOUNTER — Other Ambulatory Visit: Payer: Self-pay | Admitting: Family

## 2022-05-01 NOTE — Telephone Encounter (Signed)
[facility] patient. Last OV 04/30/22. Last RF 01/13/21 #12 3RF. Next OV 10/05/22. Vit D level 01/10/21  19.4

## 2022-05-31 ENCOUNTER — Other Ambulatory Visit: Payer: Self-pay | Admitting: Family

## 2022-06-05 ENCOUNTER — Ambulatory Visit (INDEPENDENT_AMBULATORY_CARE_PROVIDER_SITE_OTHER): Payer: 59 | Admitting: Family

## 2022-06-05 ENCOUNTER — Encounter: Payer: Self-pay | Admitting: Family

## 2022-06-05 VITALS — BP 107/73 | HR 84 | Temp 97.1°F | Ht 63.0 in | Wt 292.8 lb

## 2022-06-05 DIAGNOSIS — Z01818 Encounter for other preprocedural examination: Secondary | ICD-10-CM

## 2022-06-05 DIAGNOSIS — I10 Essential (primary) hypertension: Secondary | ICD-10-CM | POA: Diagnosis not present

## 2022-06-05 DIAGNOSIS — E039 Hypothyroidism, unspecified: Secondary | ICD-10-CM | POA: Diagnosis not present

## 2022-06-05 DIAGNOSIS — Z01419 Encounter for gynecological examination (general) (routine) without abnormal findings: Secondary | ICD-10-CM

## 2022-06-05 DIAGNOSIS — F419 Anxiety disorder, unspecified: Secondary | ICD-10-CM

## 2022-06-05 NOTE — Progress Notes (Signed)
Subjective:    Patient ID: Jacqueline Peterson, female    DOB: Nov 14, 1986, 36 y.o.   MRN: [id number]  Chief Complaint  Patient presents with   Annual Exam    HPI PT presents to the office today for  pap. She is followed by bariatric clinic. She is followed by Ortho for ganglion cyst.   She is hoping to have bariatric surgery scheduled in the next couple months. Needs surgery  clearance.   She reports she has been dieting for 20 + years including weight watchers, phentermine, Adkins, slimfast, and saxenda without relief.   She has tried daily walking, yoga classes, house work, and riding bikes without success of weight loss.   She has follow problem lists. Thyroid labs drawn last visit, normal.  Patient Active Problem List   Diagnosis Date Noted   Depression, major, single episode, moderate ([place]) 10/06/2021   Hyperglycemia in pregnancy 09/02/2018   Elevated lipoprotein(a) 03/30/2018   Suppurative hidradenitis 10/19/2017   Hypertension 04/13/2017   Ventricular ectopy 01/26/2017   Seasonal affective disorder ([place]) 11/17/2016   Hypothyroidism 10/04/2015   Esophageal reflux 10/25/2014   Herpes 08/14/2013   Morbid obesity ([place]) 03/24/2012   Anxiety 01/15/2012   Insomnia 01/15/2012      Review of Systems  All other systems reviewed and are negative.      Objective:   Physical Exam Vitals reviewed.  Constitutional:      General: She is not in acute distress.    Appearance: She is well-developed. She is obese.  HENT:     Head: Normocephalic and atraumatic.     Right Ear: Tympanic membrane normal.     Left Ear: Tympanic membrane normal.  Eyes:     Pupils: Pupils are equal, round, and reactive to light.  Neck:     Thyroid: No thyromegaly.  Cardiovascular:     Rate and Rhythm: Normal rate and regular rhythm.     Heart sounds: Normal heart sounds. No murmur heard. Pulmonary:     Effort: Pulmonary effort is normal. No respiratory distress.     Breath sounds: Normal  breath sounds. No wheezing.  Abdominal:     General: Bowel sounds are normal. There is no distension.     Palpations: Abdomen is soft.     Tenderness: There is no abdominal tenderness.  Musculoskeletal:        General: No tenderness. Normal range of motion.     Cervical back: Normal range of motion and neck supple.  Skin:    General: Skin is warm and dry.  Neurological:     Mental Status: She is alert and oriented to person, place, and time.     Cranial Nerves: No cranial nerve deficit.     Deep Tendon Reflexes: Reflexes are normal and symmetric.  Psychiatric:        Behavior: Behavior normal.        Thought Content: Thought content normal.        Judgment: Judgment normal.      BP 107/73   Pulse 84   Temp (!) 97.1 F (36.2 C)   Ht '5\' 3"'$  (1.6 m)   Wt 292 lb 12.8 oz (132.8 kg)   SpO2 95%   BMI 51.87 kg/m      Assessment & Plan:  Jacqueline Peterson comes in today with chief complaint of Annual Exam   Diagnosis and orders addressed:  1. Encounter for gynecological examination without abnormal finding - Pap IG w/ reflex to HPV  when ASC-U  2. Pre-op exam - EKG 12-Lead - Nicotine/cotinine metabolites  3. Primary hypertension  4. Hypothyroidism, unspecified type  5. Morbid obesity ([place])  6. Anxiety    Labs reviewed  Health Maintenance reviewed Diet and exercise encouraged  Follow up plan: 1 month    Evelina Dun, FNP

## 2022-06-05 NOTE — Patient Instructions (Signed)

## 2022-06-09 ENCOUNTER — Other Ambulatory Visit (HOSPITAL_COMMUNITY)
Admission: RE | Admit: 2022-06-09 | Discharge: 2022-06-09 | Disposition: A | Discharge status: Home / Self Care | Payer: 59 | Source: Ambulatory Visit | Attending: Family | Admitting: Family

## 2022-06-09 DIAGNOSIS — Z01419 Encounter for gynecological examination (general) (routine) without abnormal findings: Secondary | ICD-10-CM | POA: Diagnosis present

## 2022-06-09 LAB — NICOTINE/COTININE METABOLITES
Cotinine: <1 ng/mL
Nicotine: <1 ng/mL

## 2022-06-09 NOTE — Addendum Note (Signed)
Addended by: Zannie Cove on: 06/09/2022 01:14 PM   Modules accepted: Orders

## 2022-06-15 ENCOUNTER — Other Ambulatory Visit: Payer: Self-pay | Admitting: Family

## 2022-06-15 ENCOUNTER — Encounter: Payer: Self-pay | Admitting: Physician Assistant

## 2022-06-15 DIAGNOSIS — I10 Essential (primary) hypertension: Secondary | ICD-10-CM

## 2022-06-18 LAB — CYTOLOGY - PAP: Diagnosis: NEGATIVE

## 2022-06-24 HISTORY — PX: GASTRIC BYPASS: SHX52

## 2022-07-07 ENCOUNTER — Telehealth (INDEPENDENT_AMBULATORY_CARE_PROVIDER_SITE_OTHER): Payer: 59 | Admitting: Family

## 2022-07-07 ENCOUNTER — Encounter: Payer: Self-pay | Admitting: Family

## 2022-07-07 DIAGNOSIS — F411 Generalized anxiety disorder: Secondary | ICD-10-CM | POA: Diagnosis not present

## 2022-07-07 MED ORDER — BUSPIRONE HCL 5 MG PO TABS: 5.0000 mg | ORAL_TABLET | Freq: Three times a day (TID) | ORAL | 1 refills | Status: DC | PRN

## 2022-07-07 NOTE — Progress Notes (Signed)
Virtual Visit Consent   Jacqueline Peterson, you are scheduled for a virtual visit with a [facility] provider today. Just as with appointments in the office, your consent must be obtained to participate. Your consent will be active for this visit and any virtual visit you may have with one of our providers in the next 365 days. If you have a MyChart account, a copy of this consent can be sent to you electronically.  As this is a virtual visit, video technology does not allow for your provider to perform a traditional examination. This may limit your provider's ability to fully assess your condition. If your provider identifies any concerns that need to be evaluated in person or the need to arrange testing (such as labs, EKG, etc.), we will make arrangements to do so. Although advances in technology are sophisticated, we cannot ensure that it will always work on either your end or our end. If the connection with a video visit is poor, the visit may have to be switched to a telephone visit. With either a video or telephone visit, we are not always able to ensure that we have a secure connection.  By engaging in this virtual visit, you consent to the provision of healthcare and authorize for your insurance to be billed (if applicable) for the services provided during this visit. Depending on your insurance coverage, you may receive a charge related to this service.  I need to obtain your verbal consent now. Are you willing to proceed with your visit today? Jacqueline Peterson has provided verbal consent on 07/07/2022 for a virtual visit (video or telephone). Evelina Dun, FNP  Date: 07/07/2022 12:26 PM  Virtual Visit via Video Note   I, Evelina Dun, connected with  Jacqueline Peterson  ([id number], May 06, 1986) on 07/07/22 at 11:55 AM EDT by a video-enabled telemedicine application and verified that I am speaking with the correct person using two identifiers.  Location: Patient: Virtual Visit  Location Patient: Home Provider: Virtual Visit Location Provider: Home Office   I discussed the limitations of evaluation and management by telemedicine and the availability of in person appointments. The patient expressed understanding and agreed to proceed.    History of Present Illness: Jacqueline Peterson is a 36 y.o. who identifies as a female who was assigned female at birth, and is being seen today for GAD. She had bariatric surgery 06/24/22 and stopped her Pristiq 100 mg.   States she feels more irritable and "snappy".   HPI: Anxiety Presents for follow-up visit. Symptoms include depressed mood, excessive worry, irritability and nervous/anxious behavior. Symptoms occur occasionally. The severity of symptoms is moderate.      Problems:  Patient Active Problem List   Diagnosis Date Noted   Depression, major, single episode, moderate ([place]) 10/06/2021   Hyperglycemia in pregnancy 09/02/2018   Elevated lipoprotein(a) 03/30/2018   Suppurative hidradenitis 10/19/2017   Hypertension 04/13/2017   Ventricular ectopy 01/26/2017   Seasonal affective disorder ([place]) 11/17/2016   Hypothyroidism 10/04/2015   Esophageal reflux 10/25/2014   Herpes 08/14/2013   Morbid obesity ([place]) 03/24/2012   Anxiety 01/15/2012   Insomnia 01/15/2012    Allergies:  Allergies  Allergen Reactions   Amlodipine Other (See Comments)    Fluid retention / UTIs   Drug Ingredient [Vortioxetine] Itching   Saxenda [Liraglutide -Weight Management] Nausea Only   Topamax [Topiramate] Other (See Comments)    Confusion     Medications:  Current Outpatient Medications:    busPIRone (BUSPAR) 5 MG  tablet, Take 1 tablet (5 mg total) by mouth 3 (three) times daily as needed., Disp: 90 tablet, Rfl: 1   calcium carbonate (TUMS EX) 750 MG chewable tablet, Chew 1,500 mg by mouth as needed for heartburn., Disp: , Rfl:    levothyroxine (SYNTHROID) 112 MCG tablet, TAKE 1 TABLET(112 MCG) BY MOUTH DAILY, Disp: 90 tablet,  Rfl: 0   Melatonin 3 MG CAPS, Take 3 mg by mouth at bedtime as needed (sleep)., Disp: , Rfl:    pantoprazole (PROTONIX) 40 MG tablet, TAKE 1 TABLET(40 MG) BY MOUTH AT BEDTIME, Disp: 90 tablet, Rfl: 1   cetirizine (ZYRTEC) 10 MG tablet, Take 10 mg by mouth every other day. (Patient not taking: Reported on 07/07/2022), Disp: , Rfl:    desvenlafaxine (PRISTIQ) 100 MG 24 hr tablet, TAKE 1 TABLET(100 MG) BY MOUTH DAILY (Patient not taking: Reported on 07/07/2022), Disp: 90 tablet, Rfl: 1   fluticasone (FLONASE) 50 MCG/ACT nasal spray, Place 1 spray into both nostrils daily as needed for allergies. (Patient not taking: Reported on 07/07/2022), Disp: , Rfl:    levocetirizine (XYZAL) 5 MG tablet, Take 5 mg by mouth every other day. (Patient not taking: Reported on 07/07/2022), Disp: , Rfl:    Multiple Vitamins-Minerals (ADULT GUMMY PO), Take 2 capsules by mouth at bedtime. (Patient not taking: Reported on 07/07/2022), Disp: , Rfl:    valACYclovir (VALTREX) 1000 MG tablet, TAKE 1 TABLET(1000 MG) BY MOUTH DAILY (Patient not taking: Reported on 07/07/2022), Disp: 90 tablet, Rfl: 1   Vitamin D, Ergocalciferol, (DRISDOL) 1.25 MG (50000 UNIT) CAPS capsule, TAKE 1 CAPSULE BY MOUTH EVERY 7 DAYS (Patient not taking: Reported on 07/07/2022), Disp: 12 capsule, Rfl: 0  Observations/Objective: Patient is well-developed, well-nourished in no acute distress.  Resting comfortably  at home.  Head is normocephalic, atraumatic.  No labored breathing. Speech is clear and coherent with logical content.  Patient is alert and oriented at baseline.    Assessment and Plan: 1. GAD (generalized anxiety disorder) - busPIRone (BUSPAR) 5 MG tablet; Take 1 tablet (5 mg total) by mouth 3 (three) times daily as needed.  Dispense: 90 tablet; Refill: 1  Start Buspar 5 mg TID prn Stress management  Will follow up face to face for BP check  Continue other medications   Follow Up Instructions: I discussed the assessment and treatment plan  with the patient. The patient was provided an opportunity to ask questions and all were answered. The patient agreed with the plan and demonstrated an understanding of the instructions.  A copy of instructions were sent to the patient via MyChart unless otherwise noted below.     The patient was advised to call back or seek an in-person evaluation if the symptoms worsen or if the condition fails to improve as anticipated.  Time:  I spent 13 minutes with the patient via telehealth technology discussing the above problems/concerns.    Evelina Dun, FNP

## 2022-08-09 ENCOUNTER — Other Ambulatory Visit: Payer: Self-pay | Admitting: Family

## 2022-08-10 ENCOUNTER — Telehealth: Payer: Self-pay | Admitting: Family

## 2022-08-10 NOTE — Telephone Encounter (Signed)
[facility] Needs to resched Nov appt. RFs sent to pharmacy

## 2022-09-17 ENCOUNTER — Ambulatory Visit: Payer: Self-pay

## 2022-09-18 ENCOUNTER — Ambulatory Visit
Admission: RE | Admit: 2022-09-18 | Discharge: 2022-09-18 | Disposition: A | Discharge status: Home / Self Care | Payer: 59 | Source: Ambulatory Visit | Attending: Emergency Medicine | Admitting: Emergency Medicine

## 2022-09-18 VITALS — BP 136/84 | HR 55 | Temp 98.5°F | Resp 18

## 2022-09-18 DIAGNOSIS — N3001 Acute cystitis with hematuria: Secondary | ICD-10-CM

## 2022-09-18 LAB — POCT URINALYSIS DIP (MANUAL ENTRY)
Glucose, UA: NEGATIVE mg/dL
Nitrite, UA: NEGATIVE
Protein Ur, POC: NEGATIVE mg/dL
Spec Grav, UA: 1.02 (ref 1.010–1.025)
Urobilinogen, UA: 2 E.U./dL — AB
pH, UA: 6.5 (ref 5.0–8.0)

## 2022-09-18 LAB — POCT URINE PREGNANCY: Preg Test, Ur: NEGATIVE

## 2022-09-18 MED ORDER — SULFAMETHOXAZOLE-TRIMETHOPRIM 800-160 MG PO TABS: 1.0000 | ORAL_TABLET | Freq: Two times a day (BID) | ORAL | 0 refills | Status: AC

## 2022-09-18 NOTE — ED Provider Notes (Signed)
[facility]    CSN: [id number] Arrival date & time: 09/18/22  1543    HISTORY   Chief Complaint  Patient presents with   Abdominal Pain    UTI? - Entered by patient   HPI Jacqueline Peterson is a pleasant, 36 y.o. female who presents to urgent care today. Complains of burning with urination, bladder spasms and lower abdominal pain.  Patient states her symptoms began yesterday.  Patient states she has not tried anything to alleviate her symptoms.Patient endorses burning with urination, increased frequency of urination, and increased urge to urinate.  Patient denies abnormal odor of urine, blood in urine, perineal pain, incontinence of urine, flank pain, fever, chills, malaise, rigors, significant fatigue, abnormal vaginal discharge, vaginal itching, vaginal irritation, dyspareunia, genital lesion(s), and possible exposure to STD.  Patient reports having had a urinary tract infection earlier this year but denies history of having urinary tract infections more often than this.    Past Medical History:  Diagnosis Date   Complication of anesthesia    felt part of surgery with epidural   GERD (gastroesophageal reflux disease)    History of chicken pox    HSV infection    Hypertension    Hypothyroidism    Patient Active Problem List   Diagnosis Date Noted   Depression, major, single episode, moderate ([place]) 10/06/2021   Hyperglycemia in pregnancy 09/02/2018   Elevated lipoprotein(a) 03/30/2018   Suppurative hidradenitis 10/19/2017   Hypertension 04/13/2017   Ventricular ectopy 01/26/2017   Seasonal affective disorder ([place]) 11/17/2016   Hypothyroidism 10/04/2015   Esophageal reflux 10/25/2014   Herpes 08/14/2013   Morbid obesity ([place]) 03/24/2012   Anxiety 01/15/2012   Insomnia 01/15/2012   Past Surgical History:  Procedure Laterality Date   CESAREAN SECTION N/A 07/16/2016   Procedure: CESAREAN SECTION;  Surgeon: Chancy Milroy, MD;  Location: [facility];   Service: Obstetrics;  Laterality: N/A;   CESAREAN SECTION N/A 03/28/2019   Procedure: Repeat CESAREAN SECTION;  Surgeon: Servando Salina, MD;  Location: [facility];  Service: Obstetrics;  Laterality: N/A;  EDD: 04/04/19   EXCISION METACARPAL MASS Right 04/10/2022   Procedure: RIGHT HAND METACARPAL BOSS EXCISION;  Surgeon: Milly Jakob, MD;  Location: [facility];  Service: Orthopedics;  Laterality: Right;  PRE-OP BLOCK   GASTRIC BYPASS  06/24/2022   NO PAST SURGERIES     OB History     Gravida  2   Para  2   Term  2   Preterm      AB      Living  2      SAB      IAB      Ectopic      Multiple  0   Live Births  2          Home Medications    Prior to Admission medications   Medication Sig Start Date End Date Taking? Authorizing Provider  busPIRone (BUSPAR) 5 MG tablet Take 1 tablet (5 mg total) by mouth 3 (three) times daily as needed. 07/07/22   Sharion Balloon, FNP  calcium carbonate (TUMS EX) 750 MG chewable tablet Chew 1,500 mg by mouth as needed for heartburn.    [provider]  levothyroxine (SYNTHROID) 112 MCG tablet TAKE 1 TABLET(112 MCG) BY MOUTH DAILY 08/10/22   Evelina Dun A, FNP  Melatonin 3 MG CAPS Take 3 mg by mouth at bedtime as needed (sleep).    [provider]  pantoprazole ([place])  40 MG tablet TAKE 1 TABLET(40 MG) BY MOUTH AT BEDTIME 08/10/22   Sharion Balloon, FNP    Family History Family History  Problem Relation Age of Onset   Fibroids Mother    Thyroid disease Mother        Hypo   Basal cell carcinoma Mother    Hypertension Mother    Cancer Father 5       Non Hodgkins Adreanal carcinoma, Non small cell   Hypertension Father    Early death Father    ADD / ADHD Sister    Kidney disease Maternal Uncle    Hypertension Maternal Uncle    Anxiety disorder Maternal Uncle    Diabetes Maternal Grandmother        Type 1   Pancreatitis Maternal Grandmother    Aneurysm Maternal Grandmother        brain    Hypertension Maternal Grandfather    Rheumatic fever Maternal Grandfather    Heart disease Maternal Grandfather    Hypertension Paternal Grandmother    Stroke Paternal Grandmother    Heart disease Paternal Grandmother    Lymphoma Paternal Grandfather    Hypertension Paternal Grandfather    Diabetes Paternal Grandfather    Thyroid disease Paternal Grandfather    Heart disease Paternal Grandfather    Cancer Other        60's Breast Ca   Rheum arthritis Other    Breast cancer Maternal Aunt    Social History Social History   Tobacco Use   Smoking status: Former    Packs/day: 0.25    Years: 6.00    Total pack years: 1.50    Types: Cigarettes    Quit date: 03/06/2022    Years since quitting: 0.5   Smokeless tobacco: Never  Vaping Use   Vaping Use: Never used  Substance Use Topics   Alcohol use: Yes    Alcohol/week: 0.0 standard drinks of alcohol    Comment: Occasional   Drug use: No   Allergies   Amlodipine, Drug ingredient [vortioxetine], Saxenda [liraglutide -weight management], and Topamax [topiramate]  Review of Systems Review of Systems Pertinent findings revealed after performing a 14 point review of systems has been noted in the history of present illness.  Physical Exam Triage Vital Signs ED Triage Vitals  Enc Vitals Group     BP 09/12/21 0827 (!) 147/82     Pulse Rate 09/12/21 0827 72     Resp 09/12/21 0827 18     Temp 09/12/21 0827 98.3 F (36.8 C)     Temp Source 09/12/21 0827 Oral     SpO2 09/12/21 0827 98 %     Weight --      Height --      Head Circumference --      Peak Flow --      Pain Score 09/12/21 0826 5     Pain Loc --      Pain Edu? --      Excl. in [place]? --   No data found.  Updated Vital Signs BP 136/84 (BP Location: Left Arm)   Pulse (!) 55   Temp 98.5 F (36.9 C) (Oral)   Resp 18   LMP 09/07/2022   SpO2 97%   Physical Exam Vitals and nursing note reviewed.  Constitutional:      General: She is not in acute distress.     Appearance: Normal appearance. She is not ill-appearing.  HENT:     Head: Normocephalic and atraumatic.  Eyes:     General: Lids are normal.        Right eye: No discharge.        Left eye: No discharge.     Extraocular Movements: Extraocular movements intact.     Conjunctiva/sclera: Conjunctivae normal.     Right eye: Right conjunctiva is not injected.     Left eye: Left conjunctiva is not injected.  Neck:     Trachea: Trachea and phonation normal.  Cardiovascular:     Rate and Rhythm: Normal rate and regular rhythm.     Pulses: Normal pulses.     Heart sounds: Normal heart sounds. No murmur heard.    No friction rub. No gallop.  Pulmonary:     Effort: Pulmonary effort is normal. No accessory muscle usage, prolonged expiration or respiratory distress.     Breath sounds: Normal breath sounds. No stridor, decreased air movement or transmitted upper airway sounds. No decreased breath sounds, wheezing, rhonchi or rales.  Chest:     Chest wall: No tenderness.  Abdominal:     General: Abdomen is flat. Bowel sounds are normal. There is no distension.     Palpations: Abdomen is soft.     Tenderness: There is no abdominal tenderness. There is no right CVA tenderness or left CVA tenderness.     Hernia: No hernia is present.  Musculoskeletal:        General: Normal range of motion.     Cervical back: Normal range of motion and neck supple. Normal range of motion.  Lymphadenopathy:     Cervical: No cervical adenopathy.  Skin:    General: Skin is warm and dry.     Findings: No erythema or rash.  Neurological:     General: No focal deficit present.     Mental Status: She is alert and oriented to person, place, and time.  Psychiatric:        Mood and Affect: Mood normal.        Behavior: Behavior normal.     Visual Acuity Right Eye Distance:   Left Eye Distance:   Bilateral Distance:    Right Eye Near:   Left Eye Near:    Bilateral Near:     [facility] / Diagnostics /  Procedures:     Radiology No results found.  Procedures Procedures (including critical care time) EKG  Pending results:  Labs Reviewed  POCT URINALYSIS DIP (MANUAL ENTRY) - Abnormal; Notable for the following components:      Result Value   Bilirubin, UA small (*)    Ketones, POC UA small (15) (*)    Blood, UA trace-intact (*)    Urobilinogen, UA 2.0 (*)    Leukocytes, UA Trace (*)    All other components within normal limits  POCT URINE PREGNANCY    Medications Ordered in UC: Medications - No data to display  UC Diagnoses / Final Clinical Impressions(s)   I have reviewed the triage vital signs and the nursing notes.  Pertinent labs & imaging results that were available during my care of the patient were reviewed by me and considered in my medical decision making (see chart for details).    Final diagnoses:  Acute cystitis with hematuria   Patient was advised to begin antibiotics now due to findings on urine dip. Patient was advised to begin antibiotics today due to having active symptoms of urinary tract infection.  Patient was advised to take all doses exactly as prescribed.  Patient also advised of risks of worsening infection with incomplete antibiotic therapy. Return precautions advised.  ED Prescriptions     Medication Sig Dispense Auth. Provider   sulfamethoxazole-trimethoprim (BACTRIM DS) 800-160 MG tablet Take 1 tablet by mouth 2 (two) times daily for 3 days. 6 tablet Lynden Oxford Scales, PA-C      PDMP not reviewed this encounter.  Disposition Upon Discharge:  Condition: stable for discharge home  Patient presented with concern for an acute illness with associated systemic symptoms and significant discomfort requiring urgent management. In my opinion, this is a condition that a prudent lay person (someone who possesses an average knowledge of health and medicine) may potentially expect to result in complications if not addressed  urgently such as respiratory distress, impairment of bodily function or dysfunction of bodily organs.   As such, the patient has been evaluated and assessed, work-up was performed and treatment was provided in alignment with urgent care protocols and evidence based medicine.  Patient/parent/caregiver has been advised that the patient may require follow up for further testing and/or treatment if the symptoms continue in spite of treatment, as clinically indicated and appropriate.  Routine symptom specific, illness specific and/or disease specific instructions were discussed with the patient and/or caregiver at length.  Prevention strategies for avoiding STD exposure were also discussed.  The patient will follow up with their current PCP if and as advised. If the patient does not currently have a PCP we will assist them in obtaining one.   The patient may need specialty follow up if the symptoms continue, in spite of conservative treatment and management, for further workup, evaluation, consultation and treatment as clinically indicated and appropriate.  Patient/parent/caregiver verbalized understanding and agreement of plan as discussed.  All questions were addressed during visit.  Please see discharge instructions below for further details of plan.  Discharge Instructions:   Discharge Instructions      Common causes of urinary tract infections include but are not limited to holding your urine longer than you should, squatting instead of sitting down when urinating, sitting around in wet clothing such as a wet swimsuit or gym clothes too long, not emptying your bladder after having sexual intercourse, wiping from back to front instead of front to back after having a bowel movement.     The urinalysis that we performed in the clinic today was abnormal.     You were advised to begin antibiotics today because your urinalysis is abnormal and you are having active symptoms of an acute lower urinary  tract infection also known as cystitis.    Please pick up and begin taking your prescription for Bactrim DS (trimethoprim sulfamethoxazole) as soon as possible.  Please take all doses exactly as prescribed.  You can take this medication with or without food.  This medication is safe to take with your other medications.   Please consider abstaining from sexual intercourse while you are being treated for urinary tract infection.   If you have not had complete resolution of your symptoms after completing treatment as prescribed, please return to urgent care for repeat evaluation or follow-up with your primary care provider.  Repeat urinalysis and urine culture may be indicated for more directed therapy.   Thank you for visiting urgent care today.  I appreciate the opportunity to participate in your care.       This office note has been dictated using Museum/gallery curator.  Unfortunately, this method of dictation can sometimes lead to typographical or grammatical errors.  I apologize for your inconvenience in advance if this occurs.  Please do not hesitate to reach out to me if clarification is needed.       Lynden Oxford Scales, PA-C 09/18/22 1607

## 2022-09-18 NOTE — ED Triage Notes (Signed)
Pt c/o discomfort with urination, bladder spasms and lower abd pain.  Started: yesterday   Home interventions: none

## 2022-09-18 NOTE — Discharge Instructions (Signed)
Common causes of urinary tract infections include but are not limited to holding your urine longer than you should, squatting instead of sitting down when urinating, sitting around in wet clothing such as a wet swimsuit or gym clothes too long, not emptying your bladder after having sexual intercourse, wiping from back to front instead of front to back after having a bowel movement.     The urinalysis that we performed in the clinic today was abnormal.     You were advised to begin antibiotics today because your urinalysis is abnormal and you are having active symptoms of an acute lower urinary tract infection also known as cystitis.    Please pick up and begin taking your prescription for Bactrim DS (trimethoprim sulfamethoxazole) as soon as possible.  Please take all doses exactly as prescribed.  You can take this medication with or without food.  This medication is safe to take with your other medications.   Please consider abstaining from sexual intercourse while you are being treated for urinary tract infection.   If you have not had complete resolution of your symptoms after completing treatment as prescribed, please return to urgent care for repeat evaluation or follow-up with your primary care provider.  Repeat urinalysis and urine culture may be indicated for more directed therapy.   Thank you for visiting urgent care today.  I appreciate the opportunity to participate in your care.

## 2022-09-29 ENCOUNTER — Ambulatory Visit (INDEPENDENT_AMBULATORY_CARE_PROVIDER_SITE_OTHER): Payer: 59 | Admitting: Family

## 2022-09-29 ENCOUNTER — Encounter: Payer: Self-pay | Admitting: Family

## 2022-09-29 VITALS — BP 139/83 | HR 79 | Temp 97.8°F | Ht 62.0 in | Wt 230.6 lb

## 2022-09-29 DIAGNOSIS — L821 Other seborrheic keratosis: Secondary | ICD-10-CM | POA: Diagnosis not present

## 2022-09-29 DIAGNOSIS — L989 Disorder of the skin and subcutaneous tissue, unspecified: Secondary | ICD-10-CM

## 2022-09-29 NOTE — Patient Instructions (Signed)
Seborrheic Keratosis A seborrheic keratosis is a common, noncancerous (benign) skin growth. These growths are velvety, waxy, or rough spots that appear on the skin. They are often tan, brown, or black. The skin growths can be flat or raised and may be scaly. What are the causes? The cause of this condition is not known. What increases the risk? You are more likely to develop this condition if you: Have a family history of seborrheic keratosis. Are 50 years old or older. Are pregnant. Have had estrogen replacement therapy. What are the signs or symptoms? Symptoms of this condition include growths on the face, chest, shoulders, back, or other areas. These growths: Are usually painless, but may become irritated and itchy. Can be tan, yellow, brown, black, or other colors. Are slightly raised or have a flat surface. Are sometimes rough or wart-like in texture. Are often velvety or waxy on the surface. Are round or oval-shaped. Often occur in groups, but may occur as a single growth. How is this diagnosed? This condition is diagnosed with a medical history and physical exam. A sample of the growth may be tested (skin biopsy). You may also need to see a skin specialist (dermatologist). How is this treated? Treatment is not usually needed for this condition unless the growths are irritated or bleed often. You may also choose to have the growths removed if you do not like their appearance. Growth removal may include a procedure in which: Liquid nitrogen is applied to "freeze" off the growth (cryosurgery). This is the most common procedure. The growth is burned off with electricity (electrocautery). The growth is removed by scraping (curettage). Follow these instructions at home: Watch your growth or growths for any changes. Do not scratch or pick at the growth or growths. This can cause them to become irritated or infected. Contact a health care provider if: You suddenly have many new  growths. Your growth bleeds, itches, or hurts. Your growth suddenly becomes larger or changes color. Summary A seborrheic keratosis is a common, noncancerous skin growth. Treatment is not usually needed for this condition unless the growths are irritated or bleed often. Watch your growth or growths for any changes. Contact a health care provider if you suddenly have many new growths or your growth suddenly becomes larger or changes color. This information is not intended to replace advice given to you by your health care provider. Make sure you discuss any questions you have with your health care provider. Document Revised: 01/16/2022 Document Reviewed: 01/16/2022 Elsevier Patient Education  2023 Elsevier Inc.  

## 2022-09-29 NOTE — Progress Notes (Signed)
   Subjective:    Patient ID: Jacqueline Peterson, female    DOB: Mar 12, 1986, 36 y.o.   MRN: [id number]  Chief Complaint  Patient presents with   Skin Problem    Wants referral     HPI PT presents to the office today with a skin lesion on her back. She noticed it two weeks ago, but is unsure how long it has been there. She reports her dermatologists left and needs referral.   Denies any itching, pain, redness, discharge, or fevers.    Review of Systems  All other systems reviewed and are negative.      Objective:   Physical Exam Vitals reviewed.  Constitutional:      General: She is not in acute distress.    Appearance: She is well-developed.  HENT:     Head: Normocephalic and atraumatic.  Eyes:     Pupils: Pupils are equal, round, and reactive to light.  Neck:     Thyroid: No thyromegaly.  Cardiovascular:     Rate and Rhythm: Normal rate and regular rhythm.     Heart sounds: Normal heart sounds. No murmur heard. Pulmonary:     Effort: Pulmonary effort is normal. No respiratory distress.     Breath sounds: Normal breath sounds. No wheezing.  Abdominal:     General: Bowel sounds are normal. There is no distension.     Palpations: Abdomen is soft.     Tenderness: There is no abdominal tenderness.  Musculoskeletal:        General: No tenderness. Normal range of motion.     Cervical back: Normal range of motion and neck supple.  Skin:    General: Skin is warm and dry.          Comments:  Seborrheic keratosis approx 0.4X0.4 cm on left thoracic area  Neurological:     Mental Status: She is alert and oriented to person, place, and time.     Cranial Nerves: No cranial nerve deficit.     Deep Tendon Reflexes: Reflexes are normal and symmetric.  Psychiatric:        Behavior: Behavior normal.        Thought Content: Thought content normal.        Judgment: Judgment normal.     Cryotherapy used on lesion. Pt tolerated well.    BP 139/83   Pulse 79   Temp 97.8  F (36.6 C) (Temporal)   Ht '5\' 2"'$  (1.575 m)   Wt 230 lb 9.6 oz (104.6 kg)   LMP 09/07/2022   SpO2 100%   BMI 42.18 kg/m       Assessment & Plan:  HAWLEY PAVIA comes in today with chief complaint of Skin Problem (Wants referral )   Diagnosis and orders addressed:  1. Skin lesion - Ambulatory referral to Dermatology  2. Seborrheic keratosis - Ambulatory referral to Dermatology   Area may blister, do not pick Referral to Dermatologists placed  Follow up as needed   Jacqueline Dun, FNP

## 2022-10-05 ENCOUNTER — Encounter: Payer: 59 | Admitting: Family

## 2022-10-07 ENCOUNTER — Ambulatory Visit: Payer: 59 | Admitting: Podiatry

## 2022-10-07 ENCOUNTER — Encounter: Payer: Self-pay | Admitting: Podiatry

## 2022-10-07 ENCOUNTER — Ambulatory Visit (INDEPENDENT_AMBULATORY_CARE_PROVIDER_SITE_OTHER): Payer: 59

## 2022-10-07 DIAGNOSIS — M7751 Other enthesopathy of right foot: Secondary | ICD-10-CM | POA: Diagnosis not present

## 2022-10-07 MED ORDER — TRIAMCINOLONE ACETONIDE 10 MG/ML IJ SUSP
10.0000 mg | Freq: Once | INTRAMUSCULAR | Status: AC
  Administered 2022-10-07: 10 mg

## 2022-10-10 NOTE — Progress Notes (Signed)
Subjective:   Patient ID: Jacqueline Peterson, female   DOB: 36 y.o.   MRN: [id number]   HPI Patient presents stating she has developed quite a bit of pain and stiffness in the right first MPJ with a popping like sensation.  Has not had swelling does not remember specific injury   ROS      Objective:  Physical Exam  Neurovascular status intact with patient having quite a bit of edema swelling around the first MPJ right with pain both lateral and dorsal within the joint surface     Assessment:  Inflammatory capsulitis of the right first MPJ with pain and soreness     Plan:  H&P x-ray reviewed and went ahead today did sterile prep and injected around the first MPJ 3 mg dexamethasone Kenalog 5 mg Xylocaine discussed arthritis of the joint and at this point it appears functional but there could be a structural component and also might require long-term orthotics.  Reappoint to reevaluate  X-rays do indicate moderate elevation of the first metatarsal segment no indications of advanced spur currently noted

## 2022-10-12 ENCOUNTER — Ambulatory Visit: Payer: 59 | Admitting: Podiatry

## 2022-10-14 ENCOUNTER — Ambulatory Visit: Payer: 59 | Admitting: Podiatry

## 2022-12-08 DIAGNOSIS — B009 Herpesviral infection, unspecified: Secondary | ICD-10-CM

## 2022-12-10 MED ORDER — VALACYCLOVIR HCL 1 G PO TABS: ORAL_TABLET | ORAL | 1 refills | Status: DC

## 2022-12-17 ENCOUNTER — Telehealth: Payer: Self-pay | Admitting: Podiatry

## 2022-12-17 NOTE — Telephone Encounter (Signed)
Left message for pt asking her to call with updated insurance information for her appt she scheduled thru mychart. The current insurance is showing inactive.

## 2022-12-23 ENCOUNTER — Encounter: Payer: Self-pay | Admitting: Podiatry

## 2022-12-23 ENCOUNTER — Ambulatory Visit (INDEPENDENT_AMBULATORY_CARE_PROVIDER_SITE_OTHER): Payer: 59 | Admitting: Podiatry

## 2022-12-23 DIAGNOSIS — M722 Plantar fascial fibromatosis: Secondary | ICD-10-CM | POA: Diagnosis not present

## 2022-12-23 DIAGNOSIS — M7751 Other enthesopathy of right foot: Secondary | ICD-10-CM

## 2022-12-23 MED ORDER — TRIAMCINOLONE ACETONIDE 10 MG/ML IJ SUSP
10.0000 mg | Freq: Once | INTRAMUSCULAR | Status: AC
  Administered 2022-12-23: 10 mg

## 2022-12-24 NOTE — Progress Notes (Signed)
Subjective:   Patient ID: Jacqueline Peterson, female   DOB: 37 y.o.   MRN: [id number]   HPI Patient states she was doing very well but recently started to get pain but it feels like it is more under the joint versus on top where it was previously   ROS      Objective:  Physical Exam  Neurovascular status intact muscle strength adequate inflammation pain now around the distal insertion of the plantar fascia into the first metatarsal head     Assessment:  Inflammatory tendinitis right at the distal fascia with the possibility that the joint is still involved     Plan:  Reviewed condition and at this point went ahead and did a careful sterile prep and injected the distal fascial incision right 3 mg Kenalog 5 mg Xylocaine dispensed dancers pad to take all pressure off the joint reappoint May require orthotic treatment

## 2023-01-06 ENCOUNTER — Encounter: Payer: Self-pay | Admitting: Podiatry

## 2023-01-06 ENCOUNTER — Ambulatory Visit (INDEPENDENT_AMBULATORY_CARE_PROVIDER_SITE_OTHER): Payer: 59 | Admitting: Podiatry

## 2023-01-06 DIAGNOSIS — M722 Plantar fascial fibromatosis: Secondary | ICD-10-CM

## 2023-01-06 DIAGNOSIS — M7751 Other enthesopathy of right foot: Secondary | ICD-10-CM

## 2023-01-06 NOTE — Progress Notes (Signed)
Subjective:   Patient ID: Jacqueline Peterson, female   DOB: 37 y.o.   MRN: [id number]   HPI Patient states doing really well with the right foot very pleased with how she is doing   ROS      Objective:  Physical Exam  Neurovascular status intact right plantar distal fascia much better minimal discomfort noted currently     Assessment:  Doing well from capsulitis fasciitis-like symptomatology right     Plan:  Continue padding discussed rigid bottom shoes patient's discharge will be seen back as needed

## 2023-01-28 ENCOUNTER — Ambulatory Visit: Payer: 59 | Admitting: Physician Assistant

## 2023-02-02 ENCOUNTER — Telehealth: Payer: 59 | Admitting: Physician Assistant

## 2023-02-02 DIAGNOSIS — R3989 Other symptoms and signs involving the genitourinary system: Secondary | ICD-10-CM | POA: Diagnosis not present

## 2023-02-03 MED ORDER — NITROFURANTOIN MONOHYD MACRO 100 MG PO CAPS: 100.0000 mg | ORAL_CAPSULE | Freq: Two times a day (BID) | ORAL | 0 refills | Status: DC

## 2023-02-03 NOTE — Progress Notes (Signed)
E-Visit for Urinary Problems  We are sorry that you are not feeling well.  Here is how we plan to help!  Based on what you shared with me it looks like you most likely have a simple urinary tract infection.  A UTI (Urinary Tract Infection) is a bacterial infection of the bladder.  Most cases of urinary tract infections are simple to treat but a key part of your care is to encourage you to drink plenty of fluids and watch your symptoms carefully.  I have prescribed MacroBid 100 mg twice a day for 5 days.  Your symptoms should gradually improve. Call us if the burning in your urine worsens, you develop worsening fever, back pain or pelvic pain or if your symptoms do not resolve after completing the antibiotic.  Urinary tract infections can be prevented by drinking plenty of water to keep your body hydrated.  Also be sure when you wipe, wipe from front to back and don't hold it in!  If possible, empty your bladder every 4 hours.  HOME CARE Drink plenty of fluids Compete the full course of the antibiotics even if the symptoms resolve Remember, when you need to go.go. Holding in your urine can increase the likelihood of getting a UTI! GET HELP RIGHT AWAY IF: You cannot urinate You get a high fever Worsening back pain occurs You see blood in your urine You feel sick to your stomach or throw up You feel like you are going to pass out  MAKE SURE YOU  Understand these instructions. Will watch your condition. Will get help right away if you are not doing well or get worse.   Thank you for choosing an e-visit.  Your e-visit answers were reviewed by a board certified advanced clinical practitioner to complete your personal care plan. Depending upon the condition, your plan could have included both over the counter or prescription medications.  Please review your pharmacy choice. Make sure the pharmacy is open so you can pick up prescription now. If there is a problem, you may contact your  provider through MyChart messaging and have the prescription routed to another pharmacy.  Your safety is important to us. If you have drug allergies check your prescription carefully.   For the next 24 hours you can use MyChart to ask questions about today's visit, request a non-urgent call back, or ask for a work or school excuse. You will get an email in the next two days asking about your experience. I hope that your e-visit has been valuable and will speed your recovery.  I have spent 5 minutes in review of e-visit questionnaire, review and updating patient chart, medical decision making and response to patient.   Geovany Trudo M Rojean Ige, PA-C  

## 2023-02-19 ENCOUNTER — Telehealth: Payer: 59 | Admitting: Nurse Practitioner

## 2023-02-19 DIAGNOSIS — H1030 Unspecified acute conjunctivitis, unspecified eye: Secondary | ICD-10-CM

## 2023-02-19 NOTE — Progress Notes (Signed)
[name],  We cannot send prescriptions out of state.  We can only service patients in [place] and [place] at this time.   I apologize for the inconvenience.  Please try searching the [facility] services as they are open in other states    Thank you Jacqueline Peterson

## 2023-03-13 ENCOUNTER — Other Ambulatory Visit: Payer: Self-pay | Admitting: Family

## 2023-03-22 ENCOUNTER — Encounter: Payer: Self-pay | Admitting: Podiatry

## 2023-04-02 ENCOUNTER — Ambulatory Visit: Payer: 59 | Admitting: Podiatry

## 2023-04-02 ENCOUNTER — Encounter: Payer: Self-pay | Admitting: Podiatry

## 2023-04-02 DIAGNOSIS — M722 Plantar fascial fibromatosis: Secondary | ICD-10-CM

## 2023-04-02 DIAGNOSIS — M7671 Peroneal tendinitis, right leg: Secondary | ICD-10-CM

## 2023-04-02 MED ORDER — TRIAMCINOLONE ACETONIDE 10 MG/ML IJ SUSP
10.0000 mg | Freq: Once | INTRAMUSCULAR | Status: AC
  Administered 2023-04-02: 10 mg

## 2023-04-05 NOTE — Progress Notes (Signed)
Subjective:   Patient ID: Jacqueline Peterson, female   DOB: 37 y.o.   MRN: [id number]   HPI Patient presents stating the heel still hurts for some and she is not stretching properly and she is getting pain on the outside where she has been walking differently with inflammation   ROS      Objective:  Physical Exam  Neuro ocular status intact inflammation of the peroneal tendon on the lateral side of the foot with fluid buildup with patient found to have moderate discomfort still in the plantar fascia right and into the arch mechanism     Assessment:  Planter fasciitis more chronic in nature with peroneal tendinitis which is most likely due to gait change     Plan:  H&P reviewed both conditions.  She has not been stretching bilaterally so I did dispense a static over-the-counter night splint that is prefabricated has a soft interspace and was fitted properly to the arch.  Patient will be seen back to recheck encouraged to call with questions concerns.  I did go ahead did sterile prep injected the fascia 3 mg Kenalog 5 mg Xylocaine peroneal in 6 explaining risk

## 2023-04-08 ENCOUNTER — Telehealth: Payer: 59 | Admitting: Physician Assistant

## 2023-04-08 DIAGNOSIS — R3989 Other symptoms and signs involving the genitourinary system: Secondary | ICD-10-CM

## 2023-04-08 MED ORDER — CEPHALEXIN 500 MG PO CAPS: 500.0000 mg | ORAL_CAPSULE | Freq: Two times a day (BID) | ORAL | 0 refills | Status: AC

## 2023-04-08 NOTE — Progress Notes (Signed)

## 2023-04-08 NOTE — Progress Notes (Signed)
I have spent 5 minutes in review of e-visit questionnaire, review and updating patient chart, medical decision making and response to patient.   Emrie Gayle Cody Jayani Rozman, PA-C    

## 2023-04-13 ENCOUNTER — Other Ambulatory Visit: Payer: Self-pay | Admitting: Family

## 2023-04-13 MED ORDER — LEVOTHYROXINE SODIUM 112 MCG PO TABS: 112.0000 ug | ORAL_TABLET | Freq: Every day | ORAL | 3 refills | Status: DC

## 2023-06-29 ENCOUNTER — Encounter: Payer: Self-pay | Admitting: Family

## 2023-06-29 ENCOUNTER — Ambulatory Visit: Payer: 59 | Admitting: Family

## 2023-06-29 VITALS — BP 123/73 | HR 49 | Temp 97.8°F | Wt 162.6 lb

## 2023-06-29 DIAGNOSIS — B009 Herpesviral infection, unspecified: Secondary | ICD-10-CM

## 2023-06-29 DIAGNOSIS — F411 Generalized anxiety disorder: Secondary | ICD-10-CM | POA: Diagnosis not present

## 2023-06-29 DIAGNOSIS — F321 Major depressive disorder, single episode, moderate: Secondary | ICD-10-CM

## 2023-06-29 DIAGNOSIS — Z0000 Encounter for general adult medical examination without abnormal findings: Secondary | ICD-10-CM

## 2023-06-29 DIAGNOSIS — E039 Hypothyroidism, unspecified: Secondary | ICD-10-CM

## 2023-06-29 DIAGNOSIS — Z0001 Encounter for general adult medical examination with abnormal findings: Secondary | ICD-10-CM

## 2023-06-29 DIAGNOSIS — K219 Gastro-esophageal reflux disease without esophagitis: Secondary | ICD-10-CM | POA: Diagnosis not present

## 2023-06-29 DIAGNOSIS — F419 Anxiety disorder, unspecified: Secondary | ICD-10-CM

## 2023-06-29 DIAGNOSIS — E663 Overweight: Secondary | ICD-10-CM

## 2023-06-29 MED ORDER — BUSPIRONE HCL 5 MG PO TABS
5.0000 mg | ORAL_TABLET | Freq: Three times a day (TID) | ORAL | 1 refills | Status: DC | PRN
Start: 2023-06-29 — End: 2024-09-15

## 2023-06-29 MED ORDER — LEVOTHYROXINE SODIUM 112 MCG PO TABS
112.0000 ug | ORAL_TABLET | Freq: Every day | ORAL | 3 refills | Status: DC
Start: 2023-06-29 — End: 2024-05-16

## 2023-06-29 MED ORDER — PANTOPRAZOLE SODIUM 40 MG PO TBEC
40.0000 mg | DELAYED_RELEASE_TABLET | Freq: Two times a day (BID) | ORAL | 1 refills | Status: DC
Start: 2023-06-29 — End: 2024-09-15

## 2023-06-29 MED ORDER — VALACYCLOVIR HCL 1 G PO TABS: ORAL_TABLET | ORAL | 1 refills | Status: DC

## 2023-06-29 NOTE — Patient Instructions (Signed)

## 2023-06-29 NOTE — Progress Notes (Signed)
Subjective:    Patient ID: Jacqueline Peterson, female    DOB: December 31, 1985, 37 y.o.   MRN: [id number]  Chief Complaint  Patient presents with   Annual Exam   PT presents to the office today for CPE with pap. Her last pap was 06/09/22. She is followed by bariatric clinic for hx of bariatric surgery. She has lost 141 lb.      06/29/2023    2:09 PM 09/29/2022   12:03 PM 06/05/2022    2:13 PM  Last 3 Weights  Weight (lbs) 162 lb 9.6 oz 230 lb 9.6 oz 292 lb 12.8 oz  Weight (kg) 73.755 kg 104.599 kg 132.813 kg   She takes Valtrex as needed for herpes. Last out break was over a year.  Thyroid Problem Presents for follow-up visit. Symptoms include anxiety. Patient reports no diaphoresis, dry skin or fatigue. The symptoms have been stable.  Anxiety Presents for follow-up visit. Symptoms include excessive worry, nervous/anxious behavior and restlessness. Symptoms occur occasionally. The severity of symptoms is moderate.    Gastroesophageal Reflux She complains of belching and heartburn. This is a chronic problem. The current episode started more than 1 year ago. The problem occurs occasionally. Pertinent negatives include no fatigue. Risk factors include obesity. She has tried a PPI for the symptoms. The treatment provided moderate relief.  Depression        This is a chronic problem.  The current episode started more than 1 year ago.   Associated symptoms include restlessness and sad.  Associated symptoms include no fatigue, no helplessness and no hopelessness.  Treatments tried: buspar.  Past medical history includes thyroid problem and anxiety.       Review of Systems  Constitutional:  Negative for diaphoresis and fatigue.  Gastrointestinal:  Positive for heartburn.  Psychiatric/Behavioral:  Positive for depression. The patient is nervous/anxious.   All other systems reviewed and are negative.  Family History  Problem Relation Age of Onset   Fibroids Mother    Thyroid disease  Mother        Hypo   Basal cell carcinoma Mother    Hypertension Mother    Cancer Father 67       Non Hodgkins Adreanal carcinoma, Non small cell   Hypertension Father    Early death Father    ADD / ADHD Sister    Kidney disease Maternal Uncle    Hypertension Maternal Uncle    Anxiety disorder Maternal Uncle    Diabetes Maternal Grandmother        Type 1   Pancreatitis Maternal Grandmother    Aneurysm Maternal Grandmother        brain   Hypertension Maternal Grandfather    Rheumatic fever Maternal Grandfather    Heart disease Maternal Grandfather    Hypertension Paternal Grandmother    Stroke Paternal Grandmother    Heart disease Paternal Grandmother    Lymphoma Paternal Grandfather    Hypertension Paternal Grandfather    Diabetes Paternal Grandfather    Thyroid disease Paternal Grandfather    Heart disease Paternal Grandfather    Cancer Other        60's Breast Ca   Rheum arthritis Other    Breast cancer Maternal Aunt    Social History   Socioeconomic History   Marital status: Married    Spouse name: Not on file   Number of children: 0   Years of education: Not on file   Highest education level: Not on file  Occupational History  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 Dog and cat in house. Works at [employer].      Regular Exercise -  NO   Daily Caffeine Use:  1 tea    Social Determinants of Health   Financial Resource Strain: Patient Declined (04/01/2023)   Received from [facility], [facility]   Overall Financial Resource Strain (CARDIA)    Difficulty of Paying Living Expenses: Patient declined  Food Insecurity: Patient Declined (04/01/2023)   Received from [facility], [facility]   Hunger Vital Sign    Worried About Running Out of Food in the Last Year: Patient declined    Ran Out of Food in the Last Year: Patient declined  Transportation Needs: Patient Declined (04/01/2023)   Received from [facility], [facility]   [facility] - Transportation    Lack of Transportation (Medical): Patient declined    Lack of Transportation (Non-Medical): Patient declined  Physical Activity: Not on file  Stress: No Stress Concern Present (06/24/2022)   Received from [facility], [facility]   [employer] [facility] Occupational [facility] - Occupational Stress Questionnaire    Feeling of Stress : Only a little  Social Connections: Unknown (03/16/2022)   Received from [facility], [facility]   Social Network    Social Network: Not on file       Objective:   Physical Exam Vitals reviewed.  Constitutional:      General: She is not in acute distress.    Appearance: She is well-developed.  HENT:     Head: Normocephalic and atraumatic.     Right Ear: Tympanic membrane normal.     Left Ear: Tympanic membrane normal.  Eyes:     Pupils: Pupils are equal, round, and reactive to light.  Neck:     Thyroid: No thyromegaly.  Cardiovascular:     Rate and Rhythm: Normal rate and regular rhythm.     Heart sounds: Normal heart sounds. No murmur heard. Pulmonary:     Effort: Pulmonary effort is normal. No respiratory distress.     Breath sounds: Normal breath sounds. No wheezing.  Abdominal:     General: Bowel sounds are normal. There is no distension.     Palpations: Abdomen is soft.     Tenderness: There is no abdominal tenderness.  Musculoskeletal:         General: No tenderness. Normal range of motion.     Cervical back: Normal range of motion and neck supple.  Skin:    General: Skin is warm and dry.  Neurological:     Mental Status: She is alert and oriented to person, place, and time.     Cranial Nerves: No cranial nerve deficit.     Deep Tendon Reflexes: Reflexes are normal and symmetric.  Psychiatric:  Behavior: Behavior normal.        Thought Content: Thought content normal.        Judgment: Judgment normal.       BP 123/73   Pulse (!) 49   Temp 97.8 F (36.6 C) (Temporal)   Wt 162 lb 9.6 oz (73.8 kg)   SpO2 100%   BMI 29.74 kg/m      Assessment & Plan:  DELILAH FAVRO comes in today with chief complaint of Annual Exam   Diagnosis and orders addressed:  1. GAD (generalized anxiety disorder) - busPIRone (BUSPAR) 5 MG tablet; Take 1 tablet (5 mg total) by mouth 3 (three) times daily as needed.  Dispense: 90 tablet; Refill: 1  2. Annual physical exam  3. Anxiety  4. Gastroesophageal reflux disease, unspecified whether esophagitis present - pantoprazole (PROTONIX) 40 MG tablet; Take 1 tablet (40 mg total) by mouth 2 (two) times daily.  Dispense: 180 tablet; Refill: 1  5. Hypothyroidism, unspecified type - levothyroxine (SYNTHROID) 112 MCG tablet; Take 1 tablet (112 mcg total) by mouth daily. (NEEDS TO BE SEEN BEFORE NEXT REFILL)  Dispense: 90 tablet; Refill: 3  6. Overweight (BMI 25.0-29.9)  7. Depression, major, single episode, moderate (HCC)  8. Herpes - valACYclovir (VALTREX) 1000 MG tablet; TAKE 1 TABLET(1000 MG) BY MOUTH DAILY  Dispense: 90 tablet; Refill: 1   Labs reviewed from 06/25/23 Continue current medications  Health Maintenance reviewed Diet and exercise encouraged  Follow up plan: 6 months    Jannifer Rodney, FNP

## 2023-08-05 ENCOUNTER — Ambulatory Visit (INDEPENDENT_AMBULATORY_CARE_PROVIDER_SITE_OTHER): Payer: 59

## 2023-08-05 ENCOUNTER — Encounter: Payer: Self-pay | Admitting: Family

## 2023-08-05 ENCOUNTER — Ambulatory Visit (INDEPENDENT_AMBULATORY_CARE_PROVIDER_SITE_OTHER): Payer: 59 | Admitting: Family

## 2023-08-05 VITALS — BP 112/79 | HR 62 | Temp 97.8°F | Ht 62.0 in | Wt 155.0 lb

## 2023-08-05 DIAGNOSIS — R29898 Other symptoms and signs involving the musculoskeletal system: Secondary | ICD-10-CM | POA: Diagnosis not present

## 2023-08-05 DIAGNOSIS — R200 Anesthesia of skin: Secondary | ICD-10-CM

## 2023-08-05 DIAGNOSIS — R202 Paresthesia of skin: Secondary | ICD-10-CM

## 2023-08-05 DIAGNOSIS — J069 Acute upper respiratory infection, unspecified: Secondary | ICD-10-CM | POA: Diagnosis not present

## 2023-08-05 DIAGNOSIS — M5441 Lumbago with sciatica, right side: Secondary | ICD-10-CM

## 2023-08-05 MED ORDER — DICLOFENAC SODIUM 75 MG PO TBEC
75.0000 mg | DELAYED_RELEASE_TABLET | Freq: Two times a day (BID) | ORAL | 2 refills | Status: DC
Start: 2023-08-05 — End: 2023-08-05

## 2023-08-05 MED ORDER — PREDNISONE 20 MG PO TABS
40.0000 mg | ORAL_TABLET | Freq: Every day | ORAL | 0 refills | Status: AC
Start: 2023-08-05 — End: 2023-08-10

## 2023-08-05 NOTE — Progress Notes (Signed)
Subjective:    Patient ID: Jacqueline Peterson, female    DOB: Dec 17, 1985, 37 y.o.   MRN: [id number]  Chief Complaint  Patient presents with   Tingling    Right foot/ankle/ shin   has weakness it in. Noticed about 2 weeks ago    Cough    Lowe grade fever since last Thursday    Pt presents to the office today with right ankle tingling and weakness that she noticed two weeks. Reports she noticed some weakness in her right ankle with driving and pushing on the gas and brake. Denies any injury, swelling, redness, or warmth. URI  This is a new problem. The current episode started in the past 7 days. The problem has been gradually improving. The maximum temperature recorded prior to her arrival was 100.4 - 100.9 F (improved now). Associated symptoms include coughing, ear pain (improved), headaches (improved), rhinorrhea and a sore throat (improved now). Pertinent negatives include no congestion, sinus pain or sneezing. She has tried increased fluids, decongestant and acetaminophen for the symptoms. The treatment provided mild relief.  Back Pain This is a new problem. The current episode started 1 to 4 weeks ago. The problem occurs intermittently. The pain is present in the lumbar spine. The quality of the pain is described as aching. Associated symptoms include headaches (improved).      Review of Systems  HENT:  Positive for ear pain (improved), rhinorrhea and sore throat (improved now). Negative for congestion, sinus pain and sneezing.   Respiratory:  Positive for cough.   Musculoskeletal:  Positive for back pain.  Neurological:  Positive for headaches (improved).  All other systems reviewed and are negative.      Objective:   Physical Exam Vitals reviewed.  Constitutional:      General: She is not in acute distress.    Appearance: She is well-developed.  HENT:     Head: Normocephalic and atraumatic.     Right Ear: Tympanic membrane normal.     Left Ear: Tympanic membrane  normal.  Eyes:     Pupils: Pupils are equal, round, and reactive to light.  Neck:     Thyroid: No thyromegaly.  Cardiovascular:     Rate and Rhythm: Normal rate and regular rhythm.     Heart sounds: Normal heart sounds. No murmur heard. Pulmonary:     Effort: Pulmonary effort is normal. No respiratory distress.     Breath sounds: Normal breath sounds. No wheezing.  Abdominal:     General: Bowel sounds are normal. There is no distension.     Palpations: Abdomen is soft.     Tenderness: There is no abdominal tenderness.  Musculoskeletal:        General: No tenderness. Normal range of motion.     Cervical back: Normal range of motion and neck supple.     Comments: Full ROM of right lower ankle and foot  Skin:    General: Skin is warm and dry.  Neurological:     Mental Status: She is alert and oriented to person, place, and time.     Cranial Nerves: No cranial nerve deficit.     Deep Tendon Reflexes: Reflexes are normal and symmetric.     Comments: Normal gait and 2+ strength with flexion and extension in right ankle  Psychiatric:        Behavior: Behavior normal.        Thought Content: Thought content normal.        Judgment: Judgment normal.  BP 112/79   Pulse 62   Temp 97.8 F (36.6 C) (Temporal)   Ht 5\' 2"  (1.575 m)   Wt 155 lb (70.3 kg)   SpO2 97%   BMI 28.35 kg/m       Assessment & Plan:  Jacqueline Peterson comes in today with chief complaint of Tingling (Right foot/ankle/ shin   has weakness it in. Noticed about 2 weeks ago ) and Cough (Lowe grade fever since last Thursday )   Diagnosis and orders addressed:  1. Ankle weakness - DG Lumbar Spine 2-3 Views  2. Numbness and tingling of right leg - DG Lumbar Spine 2-3 Views  3. Viral URI -- Take meds as prescribed - Use a cool mist humidifier  -Use saline nose sprays frequently -Force fluids -For any cough or congestion  Use plain Mucinex- regular strength or max strength is fine -For fever or  aces or pains- take tylenol or ibuprofen. -Throat lozenges if help  4. Acute right-sided low back pain with right-sided sciatica Can not take NSAIDs related to hx gastric surgery  Prednisone  ROM exercises  If ankle weakness continues or worsens will need MRI.  - DG Lumbar Spine 2-3 Views - predniSONE (DELTASONE) 20 MG tablet; Take 2 tablets (40 mg total) by mouth daily with breakfast for 5 days.  Dispense: 10 tablet; Refill: 0     Follow up plan: Keep chronic follow up   Jannifer Rodney, FNP

## 2023-08-05 NOTE — Patient Instructions (Signed)

## 2023-08-05 NOTE — Progress Notes (Deleted)
Annual Wellness Visit     Patient: Jacqueline Peterson, Female    DOB: 03-25-1986, 37 y.o.   MRN: [id number]  Subjective  Chief Complaint  Patient presents with   Tingling    Right foot/ankle/ shin   has weakness it in. Noticed about 2 weeks ago    Cough    Lowe grade fever since last Thursday     Jacqueline Peterson is a 37 y.o. female who presents today for her Annual Wellness Visit. She reports consuming a {diet types:17450} diet. {Exercise:19826} She generally feels {well/fairly well/poorly:18703}. She reports sleeping {well/fairly well/poorly:18703}. She {does/does not:200015} have additional problems to discuss today.   HPI  {VISON DENTAL STD PSA (Optional):27386}   {History (Optional):23778}  Medications: Outpatient Medications Prior to Visit  Medication Sig   ascorbic acid (VITAMIN C) 100 MG tablet Take 1 tablet by mouth daily.   busPIRone (BUSPAR) 5 MG tablet Take 1 tablet (5 mg total) by mouth 3 (three) times daily as needed.   Calcium 200 MG TABS Take by mouth.   ferrous sulfate 324 MG TBEC Take 324 mg by mouth.   levothyroxine (SYNTHROID) 112 MCG tablet Take 1 tablet (112 mcg total) by mouth daily. (NEEDS TO BE SEEN BEFORE NEXT REFILL)   Melatonin 3 MG CAPS Take 3 mg by mouth at bedtime as needed (sleep).   Multiple Vitamin (MULTIVITAMIN) capsule Take 1 capsule by mouth daily.   pantoprazole (PROTONIX) 40 MG tablet Take 1 tablet (40 mg total) by mouth 2 (two) times daily.   valACYclovir (VALTREX) 1000 MG tablet TAKE 1 TABLET(1000 MG) BY MOUTH DAILY   No facility-administered medications prior to visit.    Allergies  Allergen Reactions   Liraglutide (Weight Management) Nausea Only and Other (See Comments)    nausea nausea   Amlodipine Other (See Comments)    Fluid retention    Drug Ingredient [Vortioxetine] Itching   Saxenda [Liraglutide -Weight Management] Nausea Only   Topamax [Topiramate] Other (See Comments)    Confusion      Patient Care  Team: Junie Spencer, FNP as PCP - General (Family Medicine) Maxie Better, MD as Consulting Physician (Obstetrics and Gynecology)  ROS      Objective  BP 112/79   Pulse 62   Temp 97.8 F (36.6 C) (Temporal)   Ht 5\' 2"  (1.575 m)   Wt 155 lb (70.3 kg)   SpO2 97%   BMI 28.35 kg/m  {Vitals History (Optional):23777}  Physical Exam    Most recent functional status assessment:     No data to display         Most recent fall risk assessment:    09/29/2022   12:03 PM  Fall Risk   Falls in the past year? 0    Most recent depression screenings:    06/29/2023    2:12 PM 09/29/2022   12:03 PM  PHQ 2/9 Scores  PHQ - 2 Score 1 1  PHQ- 9 Score 3 2   Most recent cognitive screening:     No data to display         Most recent Audit-C alcohol use screening     No data to display         A score of 3 or more in women, and 4 or more in men indicates increased risk for alcohol abuse, EXCEPT if all of the points are from question 1   Vision/Hearing Screen: No results found.  {Labs (Optional):23779}  No results found  for any visits on 08/05/23.    Assessment & Plan   Annual wellness visit done today including the all of the following: Reviewed patient's Family Medical History Reviewed and updated list of patient's medical providers Assessment of cognitive impairment was done Assessed patient's functional ability Established a written schedule for health screening services Health Risk Assessent Completed and Reviewed  Exercise Activities and Dietary recommendations  Goals   None     Immunization History  Administered Date(s) Administered   Influenza Split 08/17/2011   Influenza,inj,Quad PF,6+ Mos 09/12/2020, 10/06/2021   Influenza-Unspecified 08/17/2011, 08/25/2013, 07/29/2014, 08/08/2015, 07/31/2017, 08/11/2018, 09/12/2020, 10/06/2021   PFIZER(Purple Top)SARS-COV-2 Vaccination 01/26/2020, 02/19/2020, 09/30/2020   PNEUMOCOCCAL CONJUGATE-20  11/28/2022   Tdap 11/16/2010, 03/20/2016, 04/20/2016    Health Maintenance  Topic Date Due   COVID-19 Vaccine (4 - 2023-24 season) 07/18/2023   INFLUENZA VACCINE  02/14/2024 (Originally 06/17/2023)   Cervical Cancer Screening (HPV/Pap Cotest)  06/09/2025   DTaP/Tdap/Td (4 - Td or Tdap) 04/20/2026   Hepatitis C Screening  Completed   HIV Screening  Completed   HPV VACCINES  Aged Out     Discussed health benefits of physical activity, and encouraged her to engage in regular exercise appropriate for her age and condition.    Problem List Items Addressed This Visit   None   No follow-ups on file.     Jannifer Rodney, FNP

## 2024-01-14 ENCOUNTER — Ambulatory Visit: Payer: Self-pay | Admitting: Family

## 2024-03-02 ENCOUNTER — Encounter: Payer: Self-pay | Admitting: Family

## 2024-03-02 ENCOUNTER — Ambulatory Visit (INDEPENDENT_AMBULATORY_CARE_PROVIDER_SITE_OTHER): Admitting: Family

## 2024-03-02 VITALS — BP 121/77 | HR 59 | Temp 98.3°F | Ht 62.0 in | Wt 138.0 lb

## 2024-03-02 DIAGNOSIS — F419 Anxiety disorder, unspecified: Secondary | ICD-10-CM | POA: Diagnosis not present

## 2024-03-02 DIAGNOSIS — R37 Sexual dysfunction, unspecified: Secondary | ICD-10-CM

## 2024-03-02 DIAGNOSIS — Z9884 Bariatric surgery status: Secondary | ICD-10-CM

## 2024-03-02 DIAGNOSIS — E039 Hypothyroidism, unspecified: Secondary | ICD-10-CM | POA: Diagnosis not present

## 2024-03-02 NOTE — Progress Notes (Signed)
 Subjective:    Patient ID: Jacqueline Peterson, female    DOB: [birth year], 38 y.o.   MRN: [id number]  Chief Complaint  Patient presents with   Personal Problem    Decreased sensation when having intercourse. The want and the desire to is still there. This has been going on for 2 years. Increased the past 6 mths      PT presents to the office today with decrease sensation with touch to her clitoris. She continues to have sensation vaginally and have an organism. Reports this has been on going for 2 years.   She has lost 163 lb since 08/23 with gastric bypass. She is nervous she may have an imbalance of her vitamins causing the decrease in sensation.   Denies any dysuria, vaginal discharge, itching, or new lesions.  Thyroid Problem Presents for follow-up visit.     Review of Systems  All other systems reviewed and are negative.   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 Dog and cat in house. Works at [employer].      Regular Exercise -  NO   Daily Caffeine Use:  1 tea   Social Drivers of Corporate investment banker Strain: Patient Declined (04/01/2023)   Received  from [facility], [facility]   Overall Financial Resource Strain (CARDIA)    Difficulty of Paying Living Expenses: Patient declined  Food Insecurity: Patient Declined (04/01/2023)   Received from [facility], [facility]   Hunger Vital Sign    Worried About Running Out of Food in the Last Year: Patient declined    Ran Out of Food in the Last Year: Patient declined  Transportation Needs: Patient Declined (04/01/2023)   Received from [facility], [facility]   [facility] - Transportation    Lack of Transportation (Medical): Patient declined    Lack of Transportation (Non-Medical): Patient declined  Physical Activity: Not on file  Stress: No Stress Concern Present (06/24/2022)   Received from [facility], [facility]   [employer] of Occupational Health - Occupational Stress Questionnaire    Feeling of Stress : Only a little  Social Connections: Unknown (03/16/2022)   Received from [facility], [facility]   Social Network    Social Network: Not on file   Family History  Problem Relation Age of Onset   Fibroids Mother    Thyroid disease Mother        Hypo   Basal cell carcinoma Mother    Hypertension Mother  Cancer Father 68       Non Hodgkins Adreanal carcinoma, Non small cell   Hypertension Father    Early death Father    ADD / ADHD Sister    Kidney disease Maternal Uncle    Hypertension Maternal Uncle    Anxiety disorder Maternal Uncle    Diabetes Maternal Grandmother        Type 1   Pancreatitis Maternal Grandmother    Aneurysm Maternal Grandmother        brain   Hypertension Maternal Grandfather    Rheumatic fever Maternal Grandfather    Heart disease Maternal Grandfather    Hypertension Paternal Grandmother    Stroke Paternal Grandmother    Heart disease Paternal Grandmother    Lymphoma Paternal Grandfather    Hypertension Paternal Grandfather    Diabetes Paternal Grandfather    Thyroid disease Paternal Grandfather    Heart disease Paternal  Grandfather    Cancer Other        60's Breast Ca   Rheum arthritis Other    Breast cancer Maternal Aunt         Objective:   Physical Exam Vitals reviewed.  Constitutional:      General: She is not in acute distress.    Appearance: She is well-developed.  HENT:     Head: Normocephalic and atraumatic.     Right Ear: Tympanic membrane normal.     Left Ear: Tympanic membrane normal.  Eyes:     Pupils: Pupils are equal, round, and reactive to light.  Neck:     Thyroid: No thyromegaly.  Cardiovascular:     Rate and Rhythm: Normal rate and regular rhythm.     Heart sounds: Normal heart sounds. No murmur heard. Pulmonary:     Effort: Pulmonary effort is normal. No respiratory distress.     Breath sounds: Normal breath sounds. No wheezing.  Abdominal:     General: Bowel sounds are normal. There is no distension.     Palpations: Abdomen is soft.     Tenderness: There is no abdominal tenderness.  Musculoskeletal:        General: No tenderness. Normal range of motion.     Cervical back: Normal range of motion and neck supple.  Skin:    General: Skin is warm and dry.  Neurological:     Mental Status: She is alert and oriented to person, place, and time.     Cranial Nerves: No cranial nerve deficit.     Deep Tendon Reflexes: Reflexes are normal and symmetric.  Psychiatric:        Behavior: Behavior normal.        Thought Content: Thought content normal.        Judgment: Judgment normal.       BP (!) 193/85   Pulse (!) 59   Temp 98.3 F (36.8 C) (Temporal)   Ht 5\' 2"  (1.575 m)   Wt 138 lb (62.6 kg)   SpO2 99%   BMI 25.24 kg/m      Assessment & Plan:  Jacqueline Peterson comes in today with chief complaint of Personal Problem (Decreased sensation when having intercourse. The want and the desire to is still there. This has been going on for 2 years. Increased the past 6 mths   )   Diagnosis and orders addressed:  1. H/O gastric bypass (Primary) - CMP14+EGFR -  Anemia Profile B - TSH - VITAMIN D 25 Hydroxy (Vit-D Deficiency, Fractures)  2. Hypothyroidism, unspecified type -  TSH  3. Sexual dysfunction, unspecified - CMP14+EGFR - Anemia Profile B - TSH - VITAMIN D 25 Hydroxy (Vit-D Deficiency, Fractures)  4. Anxiety - TSH   Labs pending Continue current medications  Keep follow up with specialists  Health Maintenance reviewed Diet and exercise encouraged  No follow-ups on file.    Tommas Fragmin, FNP

## 2024-03-02 NOTE — Patient Instructions (Signed)

## 2024-03-04 LAB — ANEMIA PROFILE B
Basophils Absolute: 0.1 10*3/uL (ref 0.0–0.2)
Basos: 1 %
EOS (ABSOLUTE): 0 10*3/uL (ref 0.0–0.4)
Eos: 1 %
Ferritin: 18 ng/mL (ref 15–150)
Folate: 16.1 ng/mL (ref >3.0)
Hematocrit: 40.9 % (ref 34.0–46.6)
Hemoglobin: 13.1 g/dL (ref 11.1–15.9)
Immature Grans (Abs): 0 10*3/uL (ref 0.0–0.1)
Immature Granulocytes: 0 %
Iron Saturation: 19 % (ref 15–55)
Iron: 62 ug/dL (ref 27–159)
Lymphocytes Absolute: 1.9 10*3/uL (ref 0.7–3.1)
Lymphs: 33 %
MCH: 29.9 pg (ref 26.6–33.0)
MCHC: 32 g/dL (ref 31.5–35.7)
MCV: 93 fL (ref 79–97)
Monocytes Absolute: 0.3 10*3/uL (ref 0.1–0.9)
Monocytes: 6 %
Neutrophils Absolute: 3.4 10*3/uL (ref 1.4–7.0)
Neutrophils: 59 %
Platelets: 233 10*3/uL (ref 150–450)
RBC: 4.38 x10E6/uL (ref 3.77–5.28)
RDW: 12.4 % (ref 11.7–15.4)
Retic Ct Pct: 1.2 % (ref 0.6–2.6)
Total Iron Binding Capacity: 326 ug/dL (ref 250–450)
UIBC: 264 ug/dL (ref 131–425)
Vitamin B-12: 509 pg/mL (ref 232–1245)
WBC: 5.7 10*3/uL (ref 3.4–10.8)

## 2024-03-04 LAB — CMP14+EGFR
ALT: 14 IU/L (ref 0–32)
AST: 12 IU/L (ref 0–40)
Albumin: 3.9 g/dL (ref 3.9–4.9)
Alkaline Phosphatase: 84 IU/L (ref 44–121)
BUN/Creatinine Ratio: 14 (ref 9–23)
BUN: 9 mg/dL (ref 6–20)
Bilirubin Total: 0.5 mg/dL (ref 0.0–1.2)
CO2: 25 mmol/L (ref 20–29)
Calcium: 8.8 mg/dL (ref 8.7–10.2)
Chloride: 105 mmol/L (ref 96–106)
Creatinine, Ser: 0.63 mg/dL (ref 0.57–1.00)
Globulin, Total: 2.1 g/dL (ref 1.5–4.5)
Glucose: 77 mg/dL (ref 70–99)
Potassium: 4.2 mmol/L (ref 3.5–5.2)
Sodium: 141 mmol/L (ref 134–144)
Total Protein: 6 g/dL (ref 6.0–8.5)
eGFR: 116 mL/min/{1.73_m2} (ref >59)

## 2024-03-04 LAB — VITAMIN D 25 HYDROXY (VIT D DEFICIENCY, FRACTURES): Vit D, 25-Hydroxy: 44.8 ng/mL (ref 30.0–100.0)

## 2024-03-04 LAB — TSH: TSH: 0.178 u[IU]/mL — ABNORMAL LOW (ref 0.450–4.500)

## 2024-03-06 ENCOUNTER — Other Ambulatory Visit: Payer: Self-pay | Admitting: Family

## 2024-03-06 DIAGNOSIS — R7989 Other specified abnormal findings of blood chemistry: Secondary | ICD-10-CM

## 2024-03-14 ENCOUNTER — Ambulatory Visit: Admitting: Family

## 2024-05-05 ENCOUNTER — Other Ambulatory Visit

## 2024-05-12 ENCOUNTER — Other Ambulatory Visit

## 2024-05-12 DIAGNOSIS — R7989 Other specified abnormal findings of blood chemistry: Secondary | ICD-10-CM

## 2024-05-13 LAB — THYROID PANEL WITH TSH
Free Thyroxine Index: 2.5 (ref 1.2–4.9)
T3 Uptake Ratio: 32 % (ref 24–39)
T4, Total: 7.8 ug/dL (ref 4.5–12.0)
TSH: 0.337 u[IU]/mL — ABNORMAL LOW (ref 0.450–4.500)

## 2024-05-16 ENCOUNTER — Other Ambulatory Visit: Payer: Self-pay | Admitting: Family

## 2024-05-16 ENCOUNTER — Ambulatory Visit: Payer: Self-pay | Admitting: Family

## 2024-05-16 MED ORDER — LEVOTHYROXINE SODIUM 100 MCG PO TABS: 100.0000 ug | ORAL_TABLET | Freq: Every day | ORAL | 1 refills | Status: DC

## 2024-09-15 ENCOUNTER — Ambulatory Visit (INDEPENDENT_AMBULATORY_CARE_PROVIDER_SITE_OTHER): Admitting: Family

## 2024-09-15 ENCOUNTER — Encounter: Payer: Self-pay | Admitting: Family

## 2024-09-15 VITALS — BP 90/58 | HR 58 | Temp 98.5°F | Ht 62.0 in | Wt 139.0 lb

## 2024-09-15 DIAGNOSIS — K219 Gastro-esophageal reflux disease without esophagitis: Secondary | ICD-10-CM | POA: Diagnosis not present

## 2024-09-15 DIAGNOSIS — E039 Hypothyroidism, unspecified: Secondary | ICD-10-CM

## 2024-09-15 DIAGNOSIS — F321 Major depressive disorder, single episode, moderate: Secondary | ICD-10-CM

## 2024-09-15 DIAGNOSIS — Z0001 Encounter for general adult medical examination with abnormal findings: Secondary | ICD-10-CM | POA: Diagnosis not present

## 2024-09-15 DIAGNOSIS — B009 Herpesviral infection, unspecified: Secondary | ICD-10-CM

## 2024-09-15 DIAGNOSIS — F419 Anxiety disorder, unspecified: Secondary | ICD-10-CM

## 2024-09-15 DIAGNOSIS — E663 Overweight: Secondary | ICD-10-CM

## 2024-09-15 DIAGNOSIS — F411 Generalized anxiety disorder: Secondary | ICD-10-CM

## 2024-09-15 DIAGNOSIS — G4700 Insomnia, unspecified: Secondary | ICD-10-CM

## 2024-09-15 DIAGNOSIS — Z0000 Encounter for general adult medical examination without abnormal findings: Secondary | ICD-10-CM

## 2024-09-15 MED ORDER — LEVOTHYROXINE SODIUM 100 MCG PO TABS: 100.0000 ug | ORAL_TABLET | Freq: Every day | ORAL | 1 refills | Status: AC

## 2024-09-15 MED ORDER — BUSPIRONE HCL 5 MG PO TABS: 5.0000 mg | ORAL_TABLET | Freq: Three times a day (TID) | ORAL | 1 refills | Status: AC | PRN

## 2024-09-15 MED ORDER — VALACYCLOVIR HCL 1 G PO TABS
ORAL_TABLET | ORAL | 1 refills | Status: AC
Start: 2024-09-15 — End: ?

## 2024-09-15 NOTE — Progress Notes (Signed)
 Subjective:    Patient ID: Jacqueline Peterson, female    DOB: 23-Aug-1986, 38 y.o.   MRN: [id number]  Chief Complaint  Patient presents with   Medical Management of Chronic Issues    Thyroid  follow up   PT presents to the office today for CPE without pap.   Her last pap was 06/09/22.   She is followed by bariatric clinic for hx of bariatric surgery. She has lost 141 lb. Her starting weight was 278 lb prior to surgery, but starting weight 301 lb. They did a full panel lab on 06/2024.      09/15/2024   10:35 AM 03/02/2024   10:43 AM 08/05/2023    3:35 PM  Last 3 Weights  Weight (lbs) 139 lb 138 lb 155 lb  Weight (kg) 63.05 kg 62.596 kg 70.308 kg   She takes Valtrex  as needed for herpes. Last out break was over a year.  Thyroid  Problem Presents for follow-up visit. Symptoms include anxiety and dry skin. Patient reports no diaphoresis or fatigue. The symptoms have been stable.  Anxiety Presents for follow-up visit. Symptoms include excessive worry, nervous/anxious behavior and restlessness. Symptoms occur occasionally. The severity of symptoms is moderate.    Gastroesophageal Reflux She complains of belching and heartburn. This is a chronic problem. The current episode started more than 1 year ago. The problem occurs occasionally. The symptoms are aggravated by certain foods. Pertinent negatives include no fatigue. Risk factors include obesity. She has tried an antacid and a diet change for the symptoms. The treatment provided moderate relief.  Depression        This is a chronic problem.  The current episode started more than 1 year ago.   Associated symptoms include restlessness and sad.  Associated symptoms include no fatigue, no helplessness and no hopelessness.  Treatments tried: buspar .  Past medical history includes thyroid  problem and anxiety.       Review of Systems  Constitutional:  Negative for diaphoresis and fatigue.  Gastrointestinal:  Positive for heartburn.   Psychiatric/Behavioral:  The patient is nervous/anxious.   All other systems reviewed and are negative.  Family History  Problem Relation Age of Onset   Fibroids Mother    Thyroid  disease Mother        Hypo   Basal cell carcinoma Mother    Hypertension Mother    Cancer Father 38       Non Hodgkins Adreanal carcinoma, Non small cell   Hypertension Father    Early death Father    ADD / ADHD Sister    Kidney disease Maternal Uncle    Hypertension Maternal Uncle    Anxiety disorder Maternal Uncle    Diabetes Maternal Grandmother        Type 1   Pancreatitis Maternal Grandmother    Aneurysm Maternal Grandmother        brain   Hypertension Maternal Grandfather    Rheumatic fever Maternal Grandfather    Heart disease Maternal Grandfather    Hypertension Paternal Grandmother    Stroke Paternal Grandmother    Heart disease Paternal Grandmother    Lymphoma Paternal Grandfather    Hypertension Paternal Grandfather    Diabetes Paternal Grandfather    Thyroid  disease Paternal Grandfather    Heart disease Paternal Grandfather    Cancer Other        60's Breast Ca   Rheum arthritis Other    Breast cancer Maternal Aunt    Social History   Socioeconomic History  Marital status: Married    Spouse name: Not on file   Number of children: 0   Years of education: Not on file   Highest education level: Not on file  Occupational History   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 Dog and cat in house. Works at [employer].      Regular Exercise -  NO   Daily Caffeine Use:  1 tea   Social Drivers of Corporate Investment Banker Strain: Patient Declined (04/01/2023)   Received [facility]   Overall Financial Resource Strain (CARDIA)    Difficulty of Paying Living Expenses: Patient declined  Food Insecurity: Patient Declined (04/01/2023)   Received from [facility]   Hunger Vital Sign    Within the past 12 months, you worried that your food would run out before you got the money to buy more.: Patient declined    Within the past 12 months, the food you bought just didn't last and you didn't have money to get more.: Patient declined  Transportation Needs: Patient Declined (04/01/2023)   Received from [facility] - Transportation    Lack of Transportation (Medical): Patient declined    Lack of Transportation (Non-Medical): Patient declined  Physical Activity: Not on file  Stress: No Stress Concern Present (06/24/2022)   Received from [facility] of Occupational Health - Occupational Stress Questionnaire    Feeling of Stress : Only a little  Social Connections: Unknown (03/16/2022)   Received from [facility]   Social Network    Social Network: Not on file       Objective:   Physical Exam Vitals reviewed.  Constitutional:      General: She is not in acute distress.    Appearance: She is well-developed.  HENT:     Head: Normocephalic and atraumatic.     Right Ear: Tympanic membrane normal.     Left Ear: Tympanic membrane normal.  Eyes:     Pupils: Pupils are equal, round, and reactive to light.  Neck:     Thyroid : No thyromegaly.  Cardiovascular:     Rate and Rhythm: Normal rate and regular rhythm.     Heart sounds: Normal heart sounds. No murmur heard. Pulmonary:     Effort: Pulmonary effort is normal. No respiratory  distress.     Breath sounds: Normal breath sounds. No wheezing.  Abdominal:     General: Bowel sounds are normal. There is no distension.     Palpations: Abdomen is soft.     Tenderness: There is no abdominal tenderness.  Musculoskeletal:        General: No tenderness. Normal range of motion.     Cervical back: Normal range of motion and neck supple.  Skin:  General: Skin is warm and dry.  Neurological:     Mental Status: She is alert and oriented to person, place, and time.     Cranial Nerves: No cranial nerve deficit.     Deep Tendon Reflexes: Reflexes are normal and symmetric.  Psychiatric:        Behavior: Behavior normal.        Thought Content: Thought content normal.        Judgment: Judgment normal.       BP (!) 90/58   Pulse (!) 58   Temp 98.5 F (36.9 C)   Ht 5' 2 (1.575 m)   Wt 139 lb (63 kg)   SpO2 97%   BMI 25.42 kg/m      Assessment & Plan:  JOCI DRESS comes in today with chief complaint of Medical Management of Chronic Issues (Thyroid  follow up)   Diagnosis and orders addressed:  1. Annual physical exam (Primary)  2. Anxiety  3. Depression, major, single episode, moderate (HCC)  4. Gastroesophageal reflux disease, unspecified whether esophagitis present  5. Herpes  - valACYclovir  (VALTREX ) 1000 MG tablet; TAKE 1 TABLET(1000 MG) BY MOUTH DAILY  Dispense: 90 tablet; Refill: 1  6. Hypothyroidism, unspecified type - levothyroxine  (SYNTHROID ) 100 MCG tablet; Take 1 tablet (100 mcg total) by mouth daily.  Dispense: 90 tablet; Refill: 1  7. Overweight (BMI 25.0-29.9)  8. Insomnia, unspecified type  9. GAD (generalized anxiety disorder) - busPIRone  (BUSPAR ) 5 MG tablet; Take 1 tablet (5 mg total) by mouth 3 (three) times daily as needed.  Dispense: 90 tablet; Refill: 1   Labs reviewed from 06/30/24 from bariatric surgery Continue current medications  Health Maintenance reviewed Diet and exercise encouraged  Follow up plan: 6  months    Bari Learn, FNP

## 2024-09-15 NOTE — Patient Instructions (Signed)
